# Patient Record
Sex: Female | Born: 1970
Health system: Southern US, Community
[De-identification: ages and names within clinical notes are randomized; demographics above are authoritative.]

## PROBLEM LIST (undated history)

## (undated) DIAGNOSIS — Z8489 Family history of other specified conditions: Secondary | ICD-10-CM

## (undated) DIAGNOSIS — K638219 Small intestinal bacterial overgrowth, unspecified: Secondary | ICD-10-CM

## (undated) DIAGNOSIS — Z5681 Sexual harassment on the job: Secondary | ICD-10-CM

## (undated) DIAGNOSIS — G43829 Menstrual migraine, not intractable, without status migrainosus: Secondary | ICD-10-CM

## (undated) DIAGNOSIS — N921 Excessive and frequent menstruation with irregular cycle: Secondary | ICD-10-CM

## (undated) DIAGNOSIS — D500 Iron deficiency anemia secondary to blood loss (chronic): Secondary | ICD-10-CM

## (undated) DIAGNOSIS — L719 Rosacea, unspecified: Secondary | ICD-10-CM

## (undated) DIAGNOSIS — I341 Nonrheumatic mitral (valve) prolapse: Secondary | ICD-10-CM

## (undated) DIAGNOSIS — F419 Anxiety disorder, unspecified: Secondary | ICD-10-CM

## (undated) DIAGNOSIS — J310 Chronic rhinitis: Secondary | ICD-10-CM

## (undated) DIAGNOSIS — K529 Noninfective gastroenteritis and colitis, unspecified: Secondary | ICD-10-CM

## (undated) DIAGNOSIS — E119 Type 2 diabetes mellitus without complications: Secondary | ICD-10-CM

## (undated) DIAGNOSIS — K909 Intestinal malabsorption, unspecified: Secondary | ICD-10-CM

## (undated) DIAGNOSIS — E039 Hypothyroidism, unspecified: Secondary | ICD-10-CM

## (undated) DIAGNOSIS — F411 Generalized anxiety disorder: Secondary | ICD-10-CM

## (undated) DIAGNOSIS — E785 Hyperlipidemia, unspecified: Secondary | ICD-10-CM

## (undated) DIAGNOSIS — K6389 Other specified diseases of intestine: Secondary | ICD-10-CM

## (undated) DIAGNOSIS — D649 Anemia, unspecified: Secondary | ICD-10-CM

## (undated) DIAGNOSIS — F341 Dysthymic disorder: Secondary | ICD-10-CM

## (undated) HISTORY — DX: Dysthymic disorder: F34.1

## (undated) HISTORY — DX: Small intestinal bacterial overgrowth, unspecified: K63.8219

## (undated) HISTORY — DX: Excessive and frequent menstruation with irregular cycle: N92.1

## (undated) HISTORY — DX: Hyperlipidemia, unspecified: E78.5

## (undated) HISTORY — DX: Intestinal malabsorption, unspecified: K90.9

## (undated) HISTORY — DX: Type 2 diabetes mellitus without complications: E11.9

## (undated) HISTORY — DX: Menstrual migraine, not intractable, without status migrainosus: G43.829

## (undated) HISTORY — DX: Rosacea, unspecified: L71.9

## (undated) HISTORY — DX: Sexual harassment on the job: Z56.81

## (undated) HISTORY — PX: COSMETIC SURGERY: SHX468

## (undated) HISTORY — DX: Generalized anxiety disorder: F41.1

## (undated) HISTORY — DX: Noninfective gastroenteritis and colitis, unspecified: K52.9

## (undated) HISTORY — DX: Iron deficiency anemia secondary to blood loss (chronic): D50.0

## (undated) HISTORY — DX: Chronic rhinitis: J31.0

## (undated) HISTORY — DX: Other specified diseases of intestine: K63.89

---

## 1993-08-11 DIAGNOSIS — F329 Major depressive disorder, single episode, unspecified: Secondary | ICD-10-CM | POA: Insufficient documentation

## 1998-10-28 DIAGNOSIS — E119 Type 2 diabetes mellitus without complications: Secondary | ICD-10-CM | POA: Insufficient documentation

## 1998-10-28 HISTORY — DX: Type 2 diabetes mellitus without complications: E11.9

## 2003-08-09 ENCOUNTER — Other Ambulatory Visit: Admission: RE | Admit: 2003-08-09 | Discharge: 2003-08-09 | Payer: Self-pay | Admitting: Obstetrics and Gynecology

## 2004-06-26 DIAGNOSIS — E785 Hyperlipidemia, unspecified: Secondary | ICD-10-CM

## 2004-06-26 HISTORY — DX: Hyperlipidemia, unspecified: E78.5

## 2004-07-12 ENCOUNTER — Other Ambulatory Visit: Admission: RE | Admit: 2004-07-12 | Discharge: 2004-07-12 | Payer: Self-pay | Admitting: Family Medicine

## 2005-07-19 ENCOUNTER — Other Ambulatory Visit: Admission: RE | Admit: 2005-07-19 | Discharge: 2005-07-19 | Payer: Self-pay | Admitting: Family Medicine

## 2005-11-09 ENCOUNTER — Ambulatory Visit: Payer: Self-pay | Admitting: Family Medicine

## 2005-11-21 ENCOUNTER — Ambulatory Visit: Payer: Self-pay | Admitting: Family Medicine

## 2005-12-13 ENCOUNTER — Ambulatory Visit: Payer: Self-pay | Admitting: Family Medicine

## 2006-02-08 ENCOUNTER — Ambulatory Visit: Payer: Self-pay | Admitting: Family Medicine

## 2006-03-13 ENCOUNTER — Ambulatory Visit: Payer: Self-pay | Admitting: Family Medicine

## 2006-05-29 ENCOUNTER — Ambulatory Visit: Payer: Self-pay | Admitting: Family Medicine

## 2006-07-29 ENCOUNTER — Ambulatory Visit: Payer: Self-pay | Admitting: Family Medicine

## 2006-07-29 ENCOUNTER — Other Ambulatory Visit: Admission: RE | Admit: 2006-07-29 | Discharge: 2006-07-29 | Payer: Self-pay | Admitting: Family Medicine

## 2006-10-03 ENCOUNTER — Ambulatory Visit: Payer: Self-pay | Admitting: Family Medicine

## 2006-12-25 ENCOUNTER — Ambulatory Visit: Payer: Self-pay | Admitting: Family Medicine

## 2007-01-15 ENCOUNTER — Ambulatory Visit: Payer: Self-pay | Admitting: Family Medicine

## 2007-02-27 DIAGNOSIS — Z5681 Sexual harassment on the job: Secondary | ICD-10-CM

## 2007-02-27 HISTORY — DX: Sexual harassment on the job: Z56.81

## 2007-06-02 ENCOUNTER — Ambulatory Visit: Payer: Self-pay | Admitting: Family Medicine

## 2007-08-18 ENCOUNTER — Ambulatory Visit: Payer: Self-pay | Admitting: Family Medicine

## 2007-08-18 ENCOUNTER — Other Ambulatory Visit: Admission: RE | Admit: 2007-08-18 | Discharge: 2007-08-18 | Payer: Self-pay | Admitting: Family Medicine

## 2007-09-17 ENCOUNTER — Ambulatory Visit: Payer: Self-pay | Admitting: Family Medicine

## 2007-11-06 ENCOUNTER — Ambulatory Visit: Payer: Self-pay | Admitting: Family Medicine

## 2007-12-01 ENCOUNTER — Ambulatory Visit: Payer: Self-pay | Admitting: Family Medicine

## 2008-02-05 ENCOUNTER — Ambulatory Visit: Payer: Self-pay | Admitting: Family Medicine

## 2008-02-17 ENCOUNTER — Ambulatory Visit: Payer: Self-pay | Admitting: Family Medicine

## 2008-04-27 ENCOUNTER — Ambulatory Visit: Payer: Self-pay | Admitting: Family Medicine

## 2008-05-13 ENCOUNTER — Ambulatory Visit: Payer: Self-pay | Admitting: Family Medicine

## 2008-05-14 ENCOUNTER — Encounter: Admission: RE | Admit: 2008-05-14 | Discharge: 2008-05-14 | Payer: Self-pay | Admitting: Family Medicine

## 2008-08-18 ENCOUNTER — Other Ambulatory Visit: Admission: RE | Admit: 2008-08-18 | Discharge: 2008-08-18 | Payer: Self-pay | Admitting: Family Medicine

## 2008-08-18 ENCOUNTER — Ambulatory Visit: Payer: Self-pay | Admitting: Family Medicine

## 2008-08-23 LAB — HM PAP SMEAR: HM Pap smear: NEGATIVE

## 2008-11-18 ENCOUNTER — Ambulatory Visit: Payer: Self-pay | Admitting: Family Medicine

## 2008-12-03 ENCOUNTER — Ambulatory Visit: Payer: Self-pay | Admitting: Family Medicine

## 2009-02-23 ENCOUNTER — Ambulatory Visit: Payer: Self-pay | Admitting: Family Medicine

## 2009-05-26 ENCOUNTER — Ambulatory Visit: Payer: Self-pay | Admitting: Physician Assistant

## 2009-06-28 ENCOUNTER — Ambulatory Visit: Payer: Self-pay | Admitting: Family Medicine

## 2009-07-12 ENCOUNTER — Ambulatory Visit: Payer: Self-pay | Admitting: Physician Assistant

## 2009-08-23 ENCOUNTER — Ambulatory Visit: Payer: Self-pay | Admitting: Physician Assistant

## 2009-11-23 ENCOUNTER — Ambulatory Visit: Payer: Self-pay | Admitting: Family Medicine

## 2009-12-21 ENCOUNTER — Encounter: Admission: RE | Admit: 2009-12-21 | Discharge: 2009-12-21 | Payer: Self-pay | Admitting: Family Medicine

## 2009-12-21 ENCOUNTER — Ambulatory Visit: Payer: Self-pay | Admitting: Family Medicine

## 2010-02-28 ENCOUNTER — Ambulatory Visit: Admit: 2010-02-28 | Payer: Self-pay | Admitting: Family Medicine

## 2010-03-14 ENCOUNTER — Encounter
Admission: RE | Admit: 2010-03-14 | Discharge: 2010-03-14 | Payer: Self-pay | Source: Home / Self Care | Attending: Family Medicine | Admitting: Family Medicine

## 2010-03-22 ENCOUNTER — Ambulatory Visit: Admit: 2010-03-22 | Payer: Self-pay | Admitting: Family Medicine

## 2010-05-03 ENCOUNTER — Ambulatory Visit (INDEPENDENT_AMBULATORY_CARE_PROVIDER_SITE_OTHER): Payer: BC Managed Care – PPO | Admitting: Family Medicine

## 2010-05-03 DIAGNOSIS — Z79899 Other long term (current) drug therapy: Secondary | ICD-10-CM

## 2010-05-03 DIAGNOSIS — E119 Type 2 diabetes mellitus without complications: Secondary | ICD-10-CM

## 2010-05-03 DIAGNOSIS — E7800 Pure hypercholesterolemia, unspecified: Secondary | ICD-10-CM

## 2010-05-03 DIAGNOSIS — R5381 Other malaise: Secondary | ICD-10-CM

## 2010-07-21 ENCOUNTER — Encounter: Payer: Self-pay | Admitting: *Deleted

## 2010-07-21 ENCOUNTER — Other Ambulatory Visit: Payer: Self-pay | Admitting: *Deleted

## 2010-07-21 DIAGNOSIS — E119 Type 2 diabetes mellitus without complications: Secondary | ICD-10-CM

## 2010-07-21 DIAGNOSIS — E782 Mixed hyperlipidemia: Secondary | ICD-10-CM

## 2010-07-21 MED ORDER — SAXAGLIPTIN HCL 5 MG PO TABS: 5.0000 mg | ORAL_TABLET | Freq: Every day | ORAL | Status: DC

## 2010-07-21 MED ORDER — OMEGA-3-ACID ETHYL ESTERS 1 G PO CAPS: 2.0000 g | ORAL_CAPSULE | Freq: Two times a day (BID) | ORAL | Status: DC

## 2010-08-17 ENCOUNTER — Encounter: Payer: Self-pay | Admitting: Family Medicine

## 2010-08-17 ENCOUNTER — Telehealth: Payer: Self-pay | Admitting: Family Medicine

## 2010-08-17 DIAGNOSIS — E785 Hyperlipidemia, unspecified: Secondary | ICD-10-CM | POA: Insufficient documentation

## 2010-08-17 DIAGNOSIS — E119 Type 2 diabetes mellitus without complications: Secondary | ICD-10-CM | POA: Insufficient documentation

## 2010-08-17 DIAGNOSIS — IMO0001 Reserved for inherently not codable concepts without codable children: Secondary | ICD-10-CM

## 2010-08-17 DIAGNOSIS — Z309 Encounter for contraceptive management, unspecified: Secondary | ICD-10-CM

## 2010-08-17 DIAGNOSIS — E7800 Pure hypercholesterolemia, unspecified: Secondary | ICD-10-CM | POA: Insufficient documentation

## 2010-08-17 DIAGNOSIS — G43909 Migraine, unspecified, not intractable, without status migrainosus: Secondary | ICD-10-CM | POA: Insufficient documentation

## 2010-08-17 MED ORDER — NORGESTREL-ETHINYL ESTRADIOL 0.3-30 MG-MCG PO TABS: 1.0000 | ORAL_TABLET | Freq: Every day | ORAL | Status: DC

## 2010-08-17 NOTE — Telephone Encounter (Signed)
Called in [place] [facility] w/1 rf for pt as her CPE is scheduled in July. Pt notified.

## 2010-08-28 ENCOUNTER — Encounter: Payer: BC Managed Care – PPO | Admitting: Family Medicine

## 2010-09-05 ENCOUNTER — Other Ambulatory Visit: Payer: Self-pay | Admitting: Family Medicine

## 2010-09-05 DIAGNOSIS — Z1231 Encounter for screening mammogram for malignant neoplasm of breast: Secondary | ICD-10-CM

## 2010-09-12 ENCOUNTER — Encounter: Payer: Self-pay | Admitting: Family Medicine

## 2010-09-14 ENCOUNTER — Ambulatory Visit (INDEPENDENT_AMBULATORY_CARE_PROVIDER_SITE_OTHER): Payer: BC Managed Care – PPO | Admitting: Family Medicine

## 2010-09-14 ENCOUNTER — Encounter: Payer: Self-pay | Admitting: Family Medicine

## 2010-09-14 VITALS — BP 110/70 | HR 68 | Ht 63.0 in | Wt 139.0 lb

## 2010-09-14 DIAGNOSIS — Z309 Encounter for contraceptive management, unspecified: Secondary | ICD-10-CM

## 2010-09-14 DIAGNOSIS — E559 Vitamin D deficiency, unspecified: Secondary | ICD-10-CM

## 2010-09-14 DIAGNOSIS — R319 Hematuria, unspecified: Secondary | ICD-10-CM

## 2010-09-14 DIAGNOSIS — G43829 Menstrual migraine, not intractable, without status migrainosus: Secondary | ICD-10-CM

## 2010-09-14 DIAGNOSIS — Z0000 Encounter for general adult medical examination without abnormal findings: Secondary | ICD-10-CM

## 2010-09-14 LAB — POCT URINALYSIS DIPSTICK
Blood, UA: NEGATIVE
Leukocytes, UA: NEGATIVE
Nitrite, UA: NEGATIVE
Protein, UA: NEGATIVE
pH, UA: 5

## 2010-09-14 MED ORDER — NORGESTREL-ETHINYL ESTRADIOL 0.3-30 MG-MCG PO TABS: 1.0000 | ORAL_TABLET | Freq: Every day | ORAL | Status: DC

## 2010-09-14 NOTE — Patient Instructions (Addendum)
As discussed, recommend a trial of lactose free diet.  If not improved, consider trial of gluten-free diet (1-2 weeks).  Try a different type of probiotic (Align).  Continue high fiber diet; try and avoid gas producing foods.  Drink plenty of water.  Consider Beano before foods which produce gas; use Simethicone (Gas-X) as needed for gas pain/bloating.  Colonoscopy and GI evaluation is recommended--please remember to call when insurance is better for you to have this scheduled  Flu shots are recommended yearly.  Consider taking either a multivitamin or a Hair and Nail vitamin to help with the nail changes you have noticed
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
## 2010-09-18 ENCOUNTER — Other Ambulatory Visit: Payer: Self-pay

## 2010-09-18 ENCOUNTER — Telehealth: Payer: Self-pay

## 2010-09-18 DIAGNOSIS — E119 Type 2 diabetes mellitus without complications: Secondary | ICD-10-CM

## 2010-09-18 MED ORDER — SAXAGLIPTIN HCL 5 MG PO TABS: 5.0000 mg | ORAL_TABLET | Freq: Every day | ORAL | Status: DC

## 2010-09-18 NOTE — Telephone Encounter (Signed)
Talked with pt and had to call pharm. They still dont have med yet so called it in

## 2010-09-18 NOTE — Telephone Encounter (Signed)
Pt informed of lab

## 2010-09-18 NOTE — Telephone Encounter (Signed)
Left message for pt to call me back 

## 2010-09-19 ENCOUNTER — Ambulatory Visit
Admission: RE | Admit: 2010-09-19 | Discharge: 2010-09-19 | Disposition: A | Discharge status: Home / Self Care | Payer: BC Managed Care – PPO | Source: Ambulatory Visit | Attending: Family Medicine | Admitting: Family Medicine

## 2010-09-19 DIAGNOSIS — Z1231 Encounter for screening mammogram for malignant neoplasm of breast: Secondary | ICD-10-CM

## 2010-09-28 ENCOUNTER — Other Ambulatory Visit: Payer: BC Managed Care – PPO

## 2010-10-19 ENCOUNTER — Other Ambulatory Visit (INDEPENDENT_AMBULATORY_CARE_PROVIDER_SITE_OTHER): Payer: BC Managed Care – PPO

## 2010-10-19 DIAGNOSIS — N390 Urinary tract infection, site not specified: Secondary | ICD-10-CM

## 2010-10-19 LAB — POCT URINALYSIS DIPSTICK
Bilirubin, UA: NEGATIVE
Ketones, UA: NEGATIVE
Protein, UA: NEGATIVE
Spec Grav, UA: 1.015
pH, UA: 7

## 2010-10-19 NOTE — Progress Notes (Signed)
Pt informed of u/a results

## 2010-10-28 LAB — HM DIABETES EYE EXAM

## 2010-11-02 ENCOUNTER — Ambulatory Visit: Payer: BC Managed Care – PPO | Admitting: Family Medicine

## 2010-11-06 ENCOUNTER — Other Ambulatory Visit: Payer: Self-pay | Admitting: Family Medicine

## 2010-11-06 DIAGNOSIS — Z1231 Encounter for screening mammogram for malignant neoplasm of breast: Secondary | ICD-10-CM

## 2010-11-09 ENCOUNTER — Ambulatory Visit (INDEPENDENT_AMBULATORY_CARE_PROVIDER_SITE_OTHER): Payer: BC Managed Care – PPO | Admitting: Family Medicine

## 2010-11-09 ENCOUNTER — Encounter: Payer: Self-pay | Admitting: Family Medicine

## 2010-11-09 VITALS — BP 106/70 | HR 92 | Ht 63.0 in | Wt 139.0 lb

## 2010-11-09 DIAGNOSIS — E559 Vitamin D deficiency, unspecified: Secondary | ICD-10-CM

## 2010-11-09 DIAGNOSIS — E119 Type 2 diabetes mellitus without complications: Secondary | ICD-10-CM

## 2010-11-09 DIAGNOSIS — E7800 Pure hypercholesterolemia, unspecified: Secondary | ICD-10-CM

## 2010-11-09 DIAGNOSIS — Z23 Encounter for immunization: Secondary | ICD-10-CM

## 2010-11-09 LAB — COMPREHENSIVE METABOLIC PANEL
ALT: 15 U/L (ref 0–35)
AST: 16 U/L (ref 0–37)
Albumin: 4.2 g/dL (ref 3.5–5.2)
CO2: 25 mEq/L (ref 19–32)
Calcium: 9.6 mg/dL (ref 8.4–10.5)
Chloride: 100 mEq/L (ref 96–112)
Creat: 0.79 mg/dL (ref 0.50–1.10)
Potassium: 4.4 mEq/L (ref 3.5–5.3)

## 2010-11-09 LAB — LIPID PANEL
Cholesterol: 108 mg/dL (ref 0–200)
Total CHOL/HDL Ratio: 2.6 Ratio

## 2010-11-09 LAB — POCT GLYCOSYLATED HEMOGLOBIN (HGB A1C): Hemoglobin A1C: 5.8

## 2010-11-09 MED ORDER — GLIMEPIRIDE 1 MG PO TABS: 0.5000 mg | ORAL_TABLET | Freq: Two times a day (BID) | ORAL | Status: DC

## 2010-11-09 MED ORDER — METFORMIN HCL 1000 MG PO TABS: 1000.0000 mg | ORAL_TABLET | Freq: Two times a day (BID) | ORAL | Status: DC

## 2010-11-09 MED ORDER — LISINOPRIL 5 MG PO TABS: 5.0000 mg | ORAL_TABLET | Freq: Every day | ORAL | Status: DC

## 2010-11-09 MED ORDER — ATORVASTATIN CALCIUM 20 MG PO TABS: 20.0000 mg | ORAL_TABLET | Freq: Every day | ORAL | Status: DC

## 2010-11-09 NOTE — Patient Instructions (Signed)
Cut back glimepiride to 1/2 tablet just once daily; if ongoing hypoglycemia, then discontinue entirely.  Monitor sugars regularly, and restart if sugars increase
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
## 2010-11-10 ENCOUNTER — Encounter: Payer: Self-pay | Admitting: Family Medicine

## 2010-11-10 LAB — VITAMIN D 25 HYDROXY (VIT D DEFICIENCY, FRACTURES): Vit D, 25-Hydroxy: 41 ng/mL (ref 30–89)

## 2011-01-02 ENCOUNTER — Other Ambulatory Visit: Payer: Self-pay | Admitting: Family Medicine

## 2011-01-15 ENCOUNTER — Other Ambulatory Visit: Payer: Self-pay | Admitting: *Deleted

## 2011-01-15 DIAGNOSIS — E7800 Pure hypercholesterolemia, unspecified: Secondary | ICD-10-CM

## 2011-01-15 DIAGNOSIS — E119 Type 2 diabetes mellitus without complications: Secondary | ICD-10-CM

## 2011-01-15 MED ORDER — ATORVASTATIN CALCIUM 20 MG PO TABS: 20.0000 mg | ORAL_TABLET | Freq: Every day | ORAL | Status: DC

## 2011-01-15 MED ORDER — GLIMEPIRIDE 1 MG PO TABS: 0.5000 mg | ORAL_TABLET | Freq: Two times a day (BID) | ORAL | Status: DC

## 2011-01-15 MED ORDER — LISINOPRIL 5 MG PO TABS: 5.0000 mg | ORAL_TABLET | Freq: Every day | ORAL | Status: DC

## 2011-01-15 MED ORDER — METFORMIN HCL 1000 MG PO TABS: 1000.0000 mg | ORAL_TABLET | Freq: Two times a day (BID) | ORAL | Status: DC

## 2011-01-15 MED ORDER — SAXAGLIPTIN HCL 5 MG PO TABS: 5.0000 mg | ORAL_TABLET | Freq: Every day | ORAL | Status: DC

## 2011-01-15 NOTE — Telephone Encounter (Signed)
Patient needed meds called into [facility](new pharmacy with her new ins plan).

## 2011-01-22 ENCOUNTER — Telehealth: Payer: Self-pay | Admitting: Family Medicine

## 2011-01-22 DIAGNOSIS — IMO0001 Reserved for inherently not codable concepts without codable children: Secondary | ICD-10-CM

## 2011-01-22 MED ORDER — NORGESTREL-ETHINYL ESTRADIOL 0.3-30 MG-MCG PO TABS: 1.0000 | ORAL_TABLET | Freq: Every day | ORAL | Status: DC

## 2011-01-22 NOTE — Telephone Encounter (Signed)
escribed to [facility].

## 2011-01-22 NOTE — Telephone Encounter (Signed)
Pt called and wants birth control Low Ogestral sent to [facility] mail order

## 2011-03-21 ENCOUNTER — Encounter: Payer: Self-pay | Admitting: Family Medicine

## 2011-03-21 ENCOUNTER — Ambulatory Visit (INDEPENDENT_AMBULATORY_CARE_PROVIDER_SITE_OTHER): Payer: BC Managed Care – PPO | Admitting: Family Medicine

## 2011-03-21 DIAGNOSIS — E119 Type 2 diabetes mellitus without complications: Secondary | ICD-10-CM

## 2011-03-21 DIAGNOSIS — IMO0001 Reserved for inherently not codable concepts without codable children: Secondary | ICD-10-CM

## 2011-03-21 DIAGNOSIS — G43829 Menstrual migraine, not intractable, without status migrainosus: Secondary | ICD-10-CM

## 2011-03-21 DIAGNOSIS — L659 Nonscarring hair loss, unspecified: Secondary | ICD-10-CM

## 2011-03-21 DIAGNOSIS — E7800 Pure hypercholesterolemia, unspecified: Secondary | ICD-10-CM

## 2011-03-21 DIAGNOSIS — Z23 Encounter for immunization: Secondary | ICD-10-CM

## 2011-03-21 NOTE — Patient Instructions (Signed)
Continue all of your current medications. Keep headache journal. Return sooner than 6 month followup if having ongoing headaches, to discuss possible preventative medications
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
## 2011-03-22 ENCOUNTER — Encounter: Payer: Self-pay | Admitting: Family Medicine

## 2011-03-22 LAB — LIPID PANEL: Cholesterol: 111 mg/dL (ref 0–200)

## 2011-03-22 LAB — TSH: TSH: 2.734 u[IU]/mL (ref 0.350–4.500)

## 2011-04-11 ENCOUNTER — Telehealth: Payer: Self-pay | Admitting: Family Medicine

## 2011-04-11 NOTE — Telephone Encounter (Signed)
This shouldn't be related to the vaccine.  If symptoms are ongoing or worsening, can schedule appt, but if only mild, just keep an eye on things.  Can use meclizine if needed for worsening dizziness, but make appt if persists

## 2011-04-11 NOTE — Telephone Encounter (Signed)
This was sent to me. PT STATES SINCE PNEUMONIA VACCINE, SHE NOTICED THAT SHE HAS STARTING SPOTTING A LITTLE AND FEELS LIKE FLUID IN EARS WHICH THROWS OFF HER BALANCE. WANTS TO KNOW IF THIS IS NORMAL REACTION TO VACCINE OR IF SHE SHLD BE CONCERNED.

## 2011-04-11 NOTE — Telephone Encounter (Signed)
Spoke with patient and let her know that these symptoms are not related to the vaccine. If symptoms persist or worsen she will need OV. Also rec she try meclizine if needed for worsening dizziness.

## 2011-04-24 ENCOUNTER — Telehealth: Payer: Self-pay | Admitting: Family Medicine

## 2011-04-24 DIAGNOSIS — E119 Type 2 diabetes mellitus without complications: Secondary | ICD-10-CM

## 2011-04-24 MED ORDER — SAXAGLIPTIN HCL 5 MG PO TABS: 5.0000 mg | ORAL_TABLET | Freq: Every day | ORAL | Status: DC

## 2011-04-24 NOTE — Telephone Encounter (Signed)
Pt needs refill for ongliza 5 mg 1 qd  At [facility]   #30 .  She states pharm always gets this wrong so she req we order this.

## 2011-04-24 NOTE — Telephone Encounter (Signed)
Done (per computer, it looks like it may have been sent to [facility] for only #30 in November, with refills, may have been our error, not pharm)

## 2011-08-13 ENCOUNTER — Other Ambulatory Visit: Payer: Self-pay | Admitting: *Deleted

## 2011-08-13 DIAGNOSIS — IMO0001 Reserved for inherently not codable concepts without codable children: Secondary | ICD-10-CM

## 2011-08-13 MED ORDER — NORGESTREL-ETHINYL ESTRADIOL 0.3-30 MG-MCG PO TABS: 1.0000 | ORAL_TABLET | Freq: Every day | ORAL | Status: DC

## 2011-09-11 ENCOUNTER — Encounter: Payer: Self-pay | Admitting: Internal Medicine

## 2011-09-20 ENCOUNTER — Encounter: Payer: BC Managed Care – PPO | Admitting: Family Medicine

## 2011-09-20 ENCOUNTER — Ambulatory Visit
Admission: RE | Admit: 2011-09-20 | Discharge: 2011-09-20 | Disposition: A | Discharge status: Home / Self Care | Payer: BC Managed Care – PPO | Source: Ambulatory Visit | Attending: Family Medicine | Admitting: Family Medicine

## 2011-09-20 DIAGNOSIS — Z1231 Encounter for screening mammogram for malignant neoplasm of breast: Secondary | ICD-10-CM

## 2011-09-24 ENCOUNTER — Ambulatory Visit (INDEPENDENT_AMBULATORY_CARE_PROVIDER_SITE_OTHER): Payer: BC Managed Care – PPO | Admitting: Family Medicine

## 2011-09-24 ENCOUNTER — Encounter: Payer: Self-pay | Admitting: Family Medicine

## 2011-09-24 VITALS — BP 112/70 | HR 72 | Ht 63.0 in | Wt 139.0 lb

## 2011-09-24 DIAGNOSIS — G4700 Insomnia, unspecified: Secondary | ICD-10-CM

## 2011-09-24 DIAGNOSIS — E7800 Pure hypercholesterolemia, unspecified: Secondary | ICD-10-CM

## 2011-09-24 DIAGNOSIS — G43909 Migraine, unspecified, not intractable, without status migrainosus: Secondary | ICD-10-CM

## 2011-09-24 DIAGNOSIS — F411 Generalized anxiety disorder: Secondary | ICD-10-CM

## 2011-09-24 DIAGNOSIS — R5383 Other fatigue: Secondary | ICD-10-CM

## 2011-09-24 DIAGNOSIS — IMO0001 Reserved for inherently not codable concepts without codable children: Secondary | ICD-10-CM

## 2011-09-24 DIAGNOSIS — Z309 Encounter for contraceptive management, unspecified: Secondary | ICD-10-CM

## 2011-09-24 DIAGNOSIS — E119 Type 2 diabetes mellitus without complications: Secondary | ICD-10-CM

## 2011-09-24 DIAGNOSIS — R5381 Other malaise: Secondary | ICD-10-CM

## 2011-09-24 DIAGNOSIS — G43829 Menstrual migraine, not intractable, without status migrainosus: Secondary | ICD-10-CM

## 2011-09-24 LAB — COMPREHENSIVE METABOLIC PANEL
ALT: 14 U/L (ref 0–35)
CO2: 28 mEq/L (ref 19–32)
Calcium: 9.4 mg/dL (ref 8.4–10.5)
Chloride: 101 mEq/L (ref 96–112)
Creat: 0.74 mg/dL (ref 0.50–1.10)
Glucose, Bld: 89 mg/dL (ref 70–99)
Sodium: 135 mEq/L (ref 135–145)
Total Protein: 6.7 g/dL (ref 6.0–8.3)

## 2011-09-24 LAB — CBC WITH DIFFERENTIAL/PLATELET
Basophils Absolute: 0 K/uL (ref 0.0–0.1)
Basophils Relative: 0 % (ref 0–1)
Eosinophils Absolute: 0.1 K/uL (ref 0.0–0.7)
Eosinophils Relative: 1 % (ref 0–5)
HCT: 40.2 % (ref 36.0–46.0)
Hemoglobin: 13.2 g/dL (ref 12.0–15.0)
Lymphocytes Relative: 30 % (ref 12–46)
Lymphs Abs: 2.9 K/uL (ref 0.7–4.0)
MCH: 29.4 pg (ref 26.0–34.0)
MCHC: 32.8 g/dL (ref 30.0–36.0)
MCV: 89.5 fL (ref 78.0–100.0)
Monocytes Absolute: 0.5 K/uL (ref 0.1–1.0)
Monocytes Relative: 6 % (ref 3–12)
Neutro Abs: 5.9 K/uL (ref 1.7–7.7)
Neutrophils Relative %: 63 % (ref 43–77)
Platelets: 308 K/uL (ref 150–400)
RBC: 4.49 MIL/uL (ref 3.87–5.11)
RDW: 13.5 % (ref 11.5–15.5)
WBC: 9.4 K/uL (ref 4.0–10.5)

## 2011-09-24 LAB — LIPID PANEL
Cholesterol: 121 mg/dL (ref 0–200)
Triglycerides: 73 mg/dL (ref <150)

## 2011-09-24 LAB — POCT GLYCOSYLATED HEMOGLOBIN (HGB A1C): Hemoglobin A1C: 6.5

## 2011-09-24 MED ORDER — METFORMIN HCL 1000 MG PO TABS: 1000.0000 mg | ORAL_TABLET | Freq: Two times a day (BID) | ORAL | Status: DC

## 2011-09-24 MED ORDER — GLIMEPIRIDE 1 MG PO TABS: 0.5000 mg | ORAL_TABLET | Freq: Two times a day (BID) | ORAL | Status: DC

## 2011-09-24 MED ORDER — LISINOPRIL 5 MG PO TABS: 5.0000 mg | ORAL_TABLET | Freq: Every day | ORAL | Status: DC

## 2011-09-24 MED ORDER — TRETINOIN 0.025 % EX GEL: 1.0000 [IU] | Freq: Every day | CUTANEOUS | Status: DC

## 2011-09-24 MED ORDER — ATORVASTATIN CALCIUM 20 MG PO TABS: 20.0000 mg | ORAL_TABLET | Freq: Every day | ORAL | Status: DC

## 2011-09-24 MED ORDER — SAXAGLIPTIN HCL 5 MG PO TABS: 5.0000 mg | ORAL_TABLET | Freq: Every day | ORAL | Status: DC

## 2011-09-24 MED ORDER — FROVATRIPTAN SUCCINATE 2.5 MG PO TABS: 2.5000 mg | ORAL_TABLET | ORAL | Status: DC | PRN
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
## 2011-09-24 NOTE — Patient Instructions (Addendum)
Continue all of your current medications. Your diabetes is well controlled.  Re-try the Frova to see if this medication is helpful in treating migraines (so that you don't have them lasting for 3-4 days).  If headaches continue to last for a while, and cause you to miss work on a regular basis, then I think a preventative medication is a good idea.  We discussed using nortriptylene as a preventative measure, versus referring you to the headache wellness center for treatment. I think the nortriptylene may help you sleep, which in and of itself might help your headaches. I recommend that you discuss your sleep issues with Dr. Evelene Croon, as anxiety seems to be a component of your trouble sleeping, and you haven't responded to the typical sleep meds.

## 2011-09-25 ENCOUNTER — Encounter: Payer: Self-pay | Admitting: Family Medicine

## 2011-09-25 DIAGNOSIS — G4700 Insomnia, unspecified: Secondary | ICD-10-CM | POA: Insufficient documentation

## 2011-09-25 DIAGNOSIS — F411 Generalized anxiety disorder: Secondary | ICD-10-CM | POA: Insufficient documentation

## 2011-09-25 LAB — MICROALBUMIN / CREATININE URINE RATIO: Microalb, Ur: 0.5 mg/dL (ref 0.00–1.89)

## 2011-09-27 ENCOUNTER — Telehealth: Payer: Self-pay | Admitting: Family Medicine

## 2011-09-27 NOTE — Telephone Encounter (Signed)
LM

## 2011-10-01 ENCOUNTER — Telehealth: Payer: Self-pay | Admitting: Internal Medicine

## 2011-10-01 MED ORDER — TRETINOIN 0.025 % EX CREA: 1.0000 [IU] | TOPICAL_CREAM | Freq: Every day | CUTANEOUS | Status: DC

## 2011-10-01 NOTE — Telephone Encounter (Signed)
Okay to change? 

## 2011-10-01 NOTE — Telephone Encounter (Signed)
Patient notified

## 2011-10-02 ENCOUNTER — Telehealth: Payer: Self-pay | Admitting: Family Medicine

## 2011-10-02 NOTE — Telephone Encounter (Signed)
LM

## 2011-11-22 ENCOUNTER — Other Ambulatory Visit: Payer: Self-pay | Admitting: *Deleted

## 2011-11-22 DIAGNOSIS — IMO0001 Reserved for inherently not codable concepts without codable children: Secondary | ICD-10-CM

## 2011-11-22 MED ORDER — NORGESTREL-ETHINYL ESTRADIOL 0.3-30 MG-MCG PO TABS: 1.0000 | ORAL_TABLET | Freq: Every day | ORAL | Status: DC

## 2011-12-05 ENCOUNTER — Ambulatory Visit (INDEPENDENT_AMBULATORY_CARE_PROVIDER_SITE_OTHER): Payer: BC Managed Care – PPO | Admitting: Family Medicine

## 2011-12-05 ENCOUNTER — Encounter: Payer: Self-pay | Admitting: Family Medicine

## 2011-12-05 VITALS — BP 128/74 | HR 84 | Ht 63.25 in | Wt 142.0 lb

## 2011-12-05 DIAGNOSIS — Z23 Encounter for immunization: Secondary | ICD-10-CM

## 2011-12-05 DIAGNOSIS — Z309 Encounter for contraceptive management, unspecified: Secondary | ICD-10-CM

## 2011-12-05 DIAGNOSIS — Z0000 Encounter for general adult medical examination without abnormal findings: Secondary | ICD-10-CM

## 2011-12-05 DIAGNOSIS — IMO0001 Reserved for inherently not codable concepts without codable children: Secondary | ICD-10-CM

## 2011-12-05 DIAGNOSIS — Z01419 Encounter for gynecological examination (general) (routine) without abnormal findings: Secondary | ICD-10-CM | POA: Insufficient documentation

## 2011-12-05 DIAGNOSIS — R319 Hematuria, unspecified: Secondary | ICD-10-CM

## 2011-12-05 DIAGNOSIS — G43829 Menstrual migraine, not intractable, without status migrainosus: Secondary | ICD-10-CM

## 2011-12-05 LAB — POCT URINALYSIS DIPSTICK
Bilirubin, UA: NEGATIVE
Glucose, UA: NEGATIVE
Ketones, UA: NEGATIVE
Protein, UA: NEGATIVE

## 2011-12-05 MED ORDER — NORGESTREL-ETHINYL ESTRADIOL 0.3-30 MG-MCG PO TABS: 1.0000 | ORAL_TABLET | Freq: Every day | ORAL | Status: DC

## 2011-12-05 MED ORDER — HYDROCODONE-ACETAMINOPHEN 5-500 MG PO TABS: 1.0000 | ORAL_TABLET | Freq: Four times a day (QID) | ORAL | Status: DC | PRN

## 2011-12-05 NOTE — Patient Instructions (Addendum)
HEALTH MAINTENANCE RECOMMENDATIONS:  It is recommended that you get at least 30 minutes of aerobic exercise at least 5 days/week (for weight loss, you may need as much as 60-90 minutes). This can be any activity that gets your heart rate up. This can be divided in 10-15 minute intervals if needed, but try and build up your endurance at least once a week.  Weight bearing exercise is also recommended twice weekly.  Eat a healthy diet with lots of vegetables, fruits and fiber.  "Colorful" foods have a lot of vitamins (ie green vegetables, tomatoes, red peppers, etc).  Limit sweet tea, regular sodas and alcoholic beverages, all of which has a lot of calories and sugar.  Up to 1 alcoholic drink daily may be beneficial for women (unless trying to lose weight, watch sugars).  Drink a lot of water.  Calcium recommendations are 1200-1500 mg daily (1500 mg for postmenopausal women or women without ovaries), and vitamin D 1000 IU daily.  This should be obtained from diet and/or supplements (vitamins), and calcium should not be taken all at once, but in divided doses.  Monthly self breast exams and yearly mammograms for women over the age of 31 is recommended.  Sunscreen of at least SPF 30 should be used on all sun-exposed parts of the skin when outside between the hours of 10 am and 4 pm (not just when at beach or pool, but even with exercise, golf, tennis, and yard work!)  Use a sunscreen that says "broad spectrum" so it covers both UVA and UVB rays, and make sure to reapply every 1-2 hours.  Remember to change the batteries in your smoke detectors when changing your clock times in the spring and fall.  Use your seat belt every time you are in a car, and please drive safely and not be distracted with cell phones and texting while driving.  You can use ibuprofen 600 mg every 6 hours (4x/day) or 800 mg every 8 hrs (3x/day)--you must take with food, and back down on the dose if it bothers your stomach (ie 3-4  ibuprofen/advil/motrin OTC tablets, 200mg  each). You can use this along with Frova if needed, and okay to use the pain med along with this, if needed.  The hydrocodone can make you sleepy, so be careful if take it and need to drive.
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
## 2011-12-06 ENCOUNTER — Other Ambulatory Visit (HOSPITAL_COMMUNITY)
Admission: RE | Admit: 2011-12-06 | Discharge: 2011-12-06 | Disposition: A | Discharge status: Home / Self Care | Payer: BC Managed Care – PPO | Source: Ambulatory Visit | Attending: Family Medicine | Admitting: Family Medicine

## 2011-12-07 LAB — URINE CULTURE
Colony Count: NO GROWTH
Organism ID, Bacteria: NO GROWTH

## 2011-12-09 ENCOUNTER — Encounter: Payer: Self-pay | Admitting: Family Medicine

## 2012-02-01 ENCOUNTER — Ambulatory Visit (INDEPENDENT_AMBULATORY_CARE_PROVIDER_SITE_OTHER): Payer: BC Managed Care – PPO | Admitting: Medical

## 2012-02-01 ENCOUNTER — Encounter: Payer: Self-pay | Admitting: Medical

## 2012-02-01 VITALS — BP 100/70 | HR 80 | Temp 98.5°F | Resp 16 | Wt 140.0 lb

## 2012-02-01 DIAGNOSIS — B349 Viral infection, unspecified: Secondary | ICD-10-CM

## 2012-02-01 DIAGNOSIS — B9789 Other viral agents as the cause of diseases classified elsewhere: Secondary | ICD-10-CM
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
## 2012-02-02 ENCOUNTER — Encounter: Payer: Self-pay | Admitting: Medical

## 2012-02-04 LAB — POCT URINALYSIS DIPSTICK
Bilirubin, UA: NEGATIVE
Urobilinogen, UA: NEGATIVE

## 2012-02-04 LAB — POCT INFLUENZA A/B: Influenza B, POC: NEGATIVE

## 2012-03-13 ENCOUNTER — Other Ambulatory Visit: Payer: Self-pay | Admitting: *Deleted

## 2012-04-07 ENCOUNTER — Other Ambulatory Visit: Payer: Self-pay | Admitting: Family Medicine

## 2012-04-07 ENCOUNTER — Telehealth: Payer: Self-pay | Admitting: Internal Medicine

## 2012-04-07 DIAGNOSIS — E119 Type 2 diabetes mellitus without complications: Secondary | ICD-10-CM

## 2012-04-07 MED ORDER — SAXAGLIPTIN HCL 5 MG PO TABS: 5.0000 mg | ORAL_TABLET | Freq: Every day | ORAL | Status: DC

## 2012-04-08 NOTE — Telephone Encounter (Signed)
done

## 2012-04-09 ENCOUNTER — Ambulatory Visit (INDEPENDENT_AMBULATORY_CARE_PROVIDER_SITE_OTHER): Payer: BC Managed Care – PPO | Admitting: Family Medicine

## 2012-04-09 ENCOUNTER — Encounter: Payer: Self-pay | Admitting: Family Medicine

## 2012-04-09 VITALS — BP 110/70 | HR 68 | Ht 63.25 in | Wt 142.0 lb

## 2012-04-09 DIAGNOSIS — E119 Type 2 diabetes mellitus without complications: Secondary | ICD-10-CM

## 2012-04-09 DIAGNOSIS — E785 Hyperlipidemia, unspecified: Secondary | ICD-10-CM

## 2012-04-09 DIAGNOSIS — G43829 Menstrual migraine, not intractable, without status migrainosus: Secondary | ICD-10-CM

## 2012-04-09 DIAGNOSIS — IMO0001 Reserved for inherently not codable concepts without codable children: Secondary | ICD-10-CM

## 2012-04-09 DIAGNOSIS — Z309 Encounter for contraceptive management, unspecified: Secondary | ICD-10-CM

## 2012-04-09 DIAGNOSIS — E7800 Pure hypercholesterolemia, unspecified: Secondary | ICD-10-CM

## 2012-04-09 DIAGNOSIS — G43909 Migraine, unspecified, not intractable, without status migrainosus: Secondary | ICD-10-CM

## 2012-04-09 LAB — POCT GLYCOSYLATED HEMOGLOBIN (HGB A1C): Hemoglobin A1C: 6.4

## 2012-04-09 LAB — LIPID PANEL
HDL: 43 mg/dL (ref >39)
LDL Cholesterol: 59 mg/dL (ref 0–99)

## 2012-04-09 LAB — TSH: TSH: 2.348 u[IU]/mL (ref 0.350–4.500)

## 2012-04-09 MED ORDER — HYDROCODONE-ACETAMINOPHEN 5-500 MG PO TABS: 1.0000 | ORAL_TABLET | Freq: Four times a day (QID) | ORAL | Status: DC | PRN

## 2012-04-09 MED ORDER — FROVATRIPTAN SUCCINATE 2.5 MG PO TABS: 2.5000 mg | ORAL_TABLET | ORAL | Status: DC | PRN

## 2012-04-09 MED ORDER — ATORVASTATIN CALCIUM 20 MG PO TABS: 20.0000 mg | ORAL_TABLET | Freq: Every day | ORAL | Status: DC

## 2012-04-09 MED ORDER — SAXAGLIPTIN HCL 5 MG PO TABS: 5.0000 mg | ORAL_TABLET | Freq: Every day | ORAL | Status: DC

## 2012-04-09 MED ORDER — METFORMIN HCL 1000 MG PO TABS: 1000.0000 mg | ORAL_TABLET | Freq: Two times a day (BID) | ORAL | Status: DC

## 2012-04-09 MED ORDER — LISINOPRIL 5 MG PO TABS: 5.0000 mg | ORAL_TABLET | Freq: Every day | ORAL | Status: DC

## 2012-04-09 MED ORDER — GLIMEPIRIDE 1 MG PO TABS: 0.5000 mg | ORAL_TABLET | Freq: Two times a day (BID) | ORAL | Status: DC

## 2012-04-09 MED ORDER — NORGESTREL-ETHINYL ESTRADIOL 0.3-30 MG-MCG PO TABS: 1.0000 | ORAL_TABLET | Freq: Every day | ORAL | Status: DC
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
## 2012-04-09 NOTE — Patient Instructions (Addendum)
Continue your current medications.  We will contact you with your results soon. Your diabetes is well controlled.

## 2012-04-10 ENCOUNTER — Encounter: Payer: Self-pay | Admitting: Family Medicine

## 2012-04-10 LAB — HEPATIC FUNCTION PANEL
AST: 20 U/L (ref 0–37)
Albumin: 4 g/dL (ref 3.5–5.2)
Alkaline Phosphatase: 63 U/L (ref 39–117)
Indirect Bilirubin: 0.2 mg/dL (ref 0.0–0.9)
Total Bilirubin: 0.3 mg/dL (ref 0.3–1.2)

## 2012-05-20 ENCOUNTER — Ambulatory Visit (INDEPENDENT_AMBULATORY_CARE_PROVIDER_SITE_OTHER): Payer: BC Managed Care – PPO | Admitting: Family Medicine

## 2012-05-20 ENCOUNTER — Encounter: Payer: Self-pay | Admitting: Family Medicine

## 2012-05-20 VITALS — BP 110/80 | HR 95 | Wt 144.0 lb

## 2012-05-20 DIAGNOSIS — R5381 Other malaise: Secondary | ICD-10-CM

## 2012-05-20 DIAGNOSIS — R5383 Other fatigue: Secondary | ICD-10-CM

## 2012-05-20 LAB — POCT URINALYSIS DIPSTICK
Bilirubin, UA: NEGATIVE
Blood, UA: NEGATIVE
Glucose, UA: NEGATIVE
Ketones, UA: NEGATIVE
Spec Grav, UA: 1.015

## 2012-05-20 LAB — CBC WITH DIFFERENTIAL/PLATELET
Eosinophils Absolute: 0.1 10*3/uL (ref 0.0–0.7)
Eosinophils Relative: 1 % (ref 0–5)
HCT: 40.5 % (ref 36.0–46.0)
Hemoglobin: 13.7 g/dL (ref 12.0–15.0)
Lymphocytes Relative: 20 % (ref 12–46)
Lymphs Abs: 1.8 10*3/uL (ref 0.7–4.0)
MCH: 29.7 pg (ref 26.0–34.0)
MCV: 87.7 fL (ref 78.0–100.0)
Monocytes Relative: 5 % (ref 3–12)
Platelets: 282 10*3/uL (ref 150–400)
RBC: 4.62 MIL/uL (ref 3.87–5.11)
WBC: 9 10*3/uL (ref 4.0–10.5)

## 2012-05-20 LAB — COMPREHENSIVE METABOLIC PANEL
ALT: 10 U/L (ref 0–35)
CO2: 25 mEq/L (ref 19–32)
Calcium: 9.1 mg/dL (ref 8.4–10.5)
Chloride: 102 mEq/L (ref 96–112)
Creat: 0.71 mg/dL (ref 0.50–1.10)
Glucose, Bld: 137 mg/dL — ABNORMAL HIGH (ref 70–99)
Total Bilirubin: 0.3 mg/dL (ref 0.3–1.2)
Total Protein: 6.7 g/dL (ref 6.0–8.3)

## 2012-05-20 NOTE — Progress Notes (Signed)
  Subjective:    Patient ID: Denise Kerr, female    DOB: 1970/07/13, 42 y.o.   MRN: [id number]  HPI She woke up 6 days ago with fatigue and decreased appetite. No fever, chills, sore throat, nausea, vomiting or diarrhea, urinary symptoms.Her menstrual cycles have been unchanged.no new stresses and in fact she just finished a big work project. No skin or hair changes, hot or cold and hot   Review of Systems     Objective:   Physical Exam alert and in no distress. Sclera are normal.Tympanic membranes and canals are normal. Throat is clear. Tonsils are normal. Neck is supple without adenopathy or thyromegaly. Cardiac exam shows a regular sinus rhythm without murmurs or gallops. Lungs are clear to auscultation.Donald exam shows no hepatosplenomegaly. DTRs normal. EKG shows no acute changes. Urine dipstick was negative       Assessment & Plan:  Fatigue - Plan: POCT urinalysis dipstick, CBC with Differential, Comprehensive metabolic panel, EKG 12-Lead  followup pending results of blood work.

## 2012-05-22 ENCOUNTER — Telehealth: Payer: Self-pay | Admitting: Family Medicine

## 2012-05-22 NOTE — Telephone Encounter (Signed)
Called and left message on pt cell# that pt needs to try on through the weekend and if still having difficulty to have her to schedule with dr.knapp(My note actually said for her set up an appointment ,but have her see how she does through the weekend and if still having difficulty, however scheduled to see [name])

## 2012-05-22 NOTE — Telephone Encounter (Signed)
Please call still not feeling much better.  States you told her to call back today if no better.  Still fatigued

## 2012-05-22 NOTE — Telephone Encounter (Signed)
My note actually said for her set up an appointment ,but have her see how she does through the weekend and if still having difficulty, however scheduled to see [name]

## 2012-06-02 ENCOUNTER — Telehealth: Payer: Self-pay | Admitting: Family Medicine

## 2012-06-02 NOTE — Telephone Encounter (Signed)
Please call, still having fatigue, not any better. Also, still having the sinus drainage issues, meds don't seem to help just make her dehydrated  Offered appointment, she states she is not sure what else can be done, already had blood work done

## 2012-06-02 NOTE — Telephone Encounter (Signed)
I talked to Denise Kerr about this. Call her up and have her schedule appointment to followup with [name].

## 2012-06-02 NOTE — Telephone Encounter (Signed)
Looks like she last saw Dr. Susann Givens; not sure what f/u plan is

## 2012-06-02 NOTE — Telephone Encounter (Signed)
CALLED PT HOME # LEFT MESSAGE TO PLEASE MAKE APT WITH DR.KNAPP FOR FOLLOW UP

## 2012-06-03 ENCOUNTER — Ambulatory Visit (INDEPENDENT_AMBULATORY_CARE_PROVIDER_SITE_OTHER): Payer: BC Managed Care – PPO | Admitting: Family Medicine

## 2012-06-03 ENCOUNTER — Encounter: Payer: Self-pay | Admitting: Family Medicine

## 2012-06-03 VITALS — BP 110/70 | HR 90 | Wt 142.0 lb

## 2012-06-03 DIAGNOSIS — N390 Urinary tract infection, site not specified: Secondary | ICD-10-CM

## 2012-06-03 DIAGNOSIS — R5381 Other malaise: Secondary | ICD-10-CM

## 2012-06-03 DIAGNOSIS — R8271 Bacteriuria: Secondary | ICD-10-CM

## 2012-06-03 DIAGNOSIS — J302 Other seasonal allergic rhinitis: Secondary | ICD-10-CM | POA: Insufficient documentation

## 2012-06-03 DIAGNOSIS — J309 Allergic rhinitis, unspecified: Secondary | ICD-10-CM

## 2012-06-03 DIAGNOSIS — R5383 Other fatigue: Secondary | ICD-10-CM

## 2012-06-03 DIAGNOSIS — R82998 Other abnormal findings in urine: Secondary | ICD-10-CM

## 2012-06-03 LAB — POCT URINALYSIS DIPSTICK
Bilirubin, UA: NEGATIVE
pH, UA: 8

## 2012-06-03 MED ORDER — SULFAMETHOXAZOLE-TRIMETHOPRIM 800-160 MG PO TABS: 1.0000 | ORAL_TABLET | Freq: Two times a day (BID) | ORAL | Status: DC

## 2012-06-03 NOTE — Progress Notes (Signed)
  Subjective:    Patient ID: Denise Kerr, female    DOB: Aug 20, 1970, 42 y.o.   MRN: [id number]  HPI She is here for reevaluation of fatigue. This has continued and in the last 2 days she is also noted sore throat, sneezing, nasal congestion with rhinorrhea. She usually takes OTC meds for this. Yesterday she also noted urinary frequency but no urgency or dysuria. Her work has her quite stressed as she we'll probably lose a job when she finishes this project.   Review of Systems     Objective:   Physical Exam alert and in no distress. Tympanic membranes and canals are normal. Throat is clear. Tonsils are normal. Neck is supple without adenopathy or thyromegaly. Cardiac exam shows a regular sinus rhythm without murmurs or gallops. Lungs are clear to auscultation. Urine microscopic did show bacteria and scattered white cells.       Assessment & Plan:  Other malaise and fatigue - Plan: POCT urinalysis dipstick  Bacteria in urine - Plan: Urine culture  UTI (urinary tract infection) - Plan: sulfamethoxazole-trimethoprim (BACTRIM DS,SEPTRA DS) 800-160 MG per tablet, CANCELED: CULTURE, URINE COMPREHENSIVE  Allergic rhinitis, seasonal  I will get a culture on this. She is to let us know how she is doing. Recommend she continue to treat her seasonal allergies.

## 2012-06-05 LAB — URINE CULTURE: Colony Count: NO GROWTH

## 2012-08-11 ENCOUNTER — Other Ambulatory Visit: Payer: Self-pay | Admitting: Family Medicine

## 2012-08-11 DIAGNOSIS — G43909 Migraine, unspecified, not intractable, without status migrainosus: Secondary | ICD-10-CM

## 2012-08-11 DIAGNOSIS — G43829 Menstrual migraine, not intractable, without status migrainosus: Secondary | ICD-10-CM

## 2012-08-11 MED ORDER — HYDROCODONE-ACETAMINOPHEN 5-500 MG PO TABS: 1.0000 | ORAL_TABLET | Freq: Four times a day (QID) | ORAL | Status: DC | PRN

## 2012-08-11 MED ORDER — FROVATRIPTAN SUCCINATE 2.5 MG PO TABS: 2.5000 mg | ORAL_TABLET | ORAL | Status: DC | PRN

## 2012-08-11 NOTE — Telephone Encounter (Signed)
Please phone in the hydrocodone refill.  I already sent Frova

## 2012-09-15 ENCOUNTER — Other Ambulatory Visit: Payer: Self-pay | Admitting: Family Medicine

## 2012-10-03 ENCOUNTER — Other Ambulatory Visit: Payer: Self-pay | Admitting: Family Medicine

## 2012-10-03 ENCOUNTER — Other Ambulatory Visit: Payer: Self-pay

## 2012-10-03 DIAGNOSIS — Z1231 Encounter for screening mammogram for malignant neoplasm of breast: Secondary | ICD-10-CM

## 2012-10-17 ENCOUNTER — Ambulatory Visit
Admission: RE | Admit: 2012-10-17 | Discharge: 2012-10-17 | Disposition: A | Discharge status: Home / Self Care | Payer: BC Managed Care – PPO | Source: Ambulatory Visit

## 2012-10-17 DIAGNOSIS — Z1231 Encounter for screening mammogram for malignant neoplasm of breast: Secondary | ICD-10-CM

## 2012-10-17 LAB — HM DIABETES EYE EXAM: HM Diabetic Eye Exam: NORMAL

## 2012-10-20 ENCOUNTER — Encounter: Payer: BC Managed Care – PPO | Admitting: Family Medicine

## 2012-10-21 ENCOUNTER — Telehealth: Payer: Self-pay | Admitting: Family Medicine

## 2012-10-21 LAB — HM MAMMOGRAPHY: HM Mammogram: NORMAL

## 2012-10-21 NOTE — Telephone Encounter (Signed)
LM

## 2012-10-29 ENCOUNTER — Encounter: Payer: Self-pay | Admitting: Family Medicine

## 2012-10-29 ENCOUNTER — Ambulatory Visit (INDEPENDENT_AMBULATORY_CARE_PROVIDER_SITE_OTHER): Payer: BC Managed Care – PPO | Admitting: Family Medicine

## 2012-10-29 VITALS — BP 120/60 | HR 80 | Ht 63.25 in | Wt 142.0 lb

## 2012-10-29 DIAGNOSIS — E7800 Pure hypercholesterolemia, unspecified: Secondary | ICD-10-CM

## 2012-10-29 DIAGNOSIS — R638 Other symptoms and signs concerning food and fluid intake: Secondary | ICD-10-CM

## 2012-10-29 DIAGNOSIS — E119 Type 2 diabetes mellitus without complications: Secondary | ICD-10-CM

## 2012-10-29 DIAGNOSIS — Z23 Encounter for immunization: Secondary | ICD-10-CM

## 2012-10-29 MED ORDER — ATORVASTATIN CALCIUM 20 MG PO TABS: ORAL_TABLET | ORAL | Status: DC

## 2012-10-29 MED ORDER — SAXAGLIPTIN HCL 5 MG PO TABS: 5.0000 mg | ORAL_TABLET | Freq: Every day | ORAL | Status: DC

## 2012-10-29 MED ORDER — LISINOPRIL 5 MG PO TABS: ORAL_TABLET | ORAL | Status: DC

## 2012-10-29 MED ORDER — GLIMEPIRIDE 1 MG PO TABS: ORAL_TABLET | ORAL | Status: DC

## 2012-10-29 MED ORDER — METFORMIN HCL 1000 MG PO TABS: ORAL_TABLET | ORAL | Status: DC

## 2012-10-29 NOTE — Patient Instructions (Signed)
Continue your same medications.  Try and increase your exercise to 3 hours/week.  Let me know if you would like to see a nutritionist.  I recommend seeing [facility] for discussion of your toe concern (possible early hammertoe).
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
## 2012-10-30 LAB — MICROALBUMIN / CREATININE URINE RATIO: Creatinine, Urine: 38.7 mg/dL

## 2012-11-10 ENCOUNTER — Telehealth: Payer: Self-pay | Admitting: Family Medicine

## 2012-11-10 DIAGNOSIS — E119 Type 2 diabetes mellitus without complications: Secondary | ICD-10-CM

## 2012-11-10 DIAGNOSIS — E7800 Pure hypercholesterolemia, unspecified: Secondary | ICD-10-CM

## 2012-11-10 MED ORDER — METFORMIN HCL 1000 MG PO TABS: ORAL_TABLET | ORAL | Status: DC

## 2012-11-10 MED ORDER — LISINOPRIL 5 MG PO TABS: ORAL_TABLET | ORAL | Status: DC

## 2012-11-10 MED ORDER — ATORVASTATIN CALCIUM 20 MG PO TABS: ORAL_TABLET | ORAL | Status: DC

## 2012-11-10 MED ORDER — SAXAGLIPTIN HCL 5 MG PO TABS: 5.0000 mg | ORAL_TABLET | Freq: Every day | ORAL | Status: DC

## 2012-11-10 MED ORDER — GLIMEPIRIDE 1 MG PO TABS: ORAL_TABLET | ORAL | Status: DC

## 2012-11-10 NOTE — Telephone Encounter (Signed)
Patient called and stated that she gave Dr.Knapp the wrong mail order pharmacy at her visit on 10/29/12. Needs meds that were filled (5) resent to [facility]. I went ahead and re-ordered.

## 2012-11-12 NOTE — Telephone Encounter (Signed)
Frova P.A. Approved til 02/25/38

## 2012-11-24 ENCOUNTER — Telehealth: Payer: Self-pay | Admitting: Family Medicine

## 2012-11-24 NOTE — Telephone Encounter (Signed)
Please call.  Her Onglyza was sent to her mail order pharmacy in error. That Rx is supposed to go to local pharmacy.   HUGE price difference. $10 per month local store and 3 month supply was well over $100 +.  She did not receive her glimepiride 1mg    Please call

## 2012-11-24 NOTE — Telephone Encounter (Signed)
Please call and see what she needs done

## 2012-11-25 ENCOUNTER — Telehealth: Payer: Self-pay | Admitting: Family Medicine

## 2012-11-25 NOTE — Telephone Encounter (Signed)
Pt called yesterday and spoke with [place] and then to myself.  She states she had req refill on her meds to [facility] vs old mail order.  Suzette Battiest went ahead and refilled them based on the voice mail she received from pt.  Pt was upset because we refilled Ongliza, which was one to be refilled but she wanted it to go to a local pharmacy as it is 10.00 per month vs the 150.00 for 3 mo supply.  Roanne Haye had called [facility] to try and return the medications as the package is still sealed and they would not accept.  Pt said she had not refilled this med with [facility] in years and they still should not have had her credit card #.  Pt asked that I call [facility].  I called them this morning [phone] and spoke with Victorino Dike, she researched this and spoke with her supervisor and said they cannot accept the medication back.  She states they are working on [employer] for the patient from their phone call with her yesterday and hope to have some resolve for the pt.  I call pt back reached cell phone voice mail and left message.

## 2012-12-01 ENCOUNTER — Telehealth: Payer: Self-pay | Admitting: Family Medicine

## 2012-12-01 DIAGNOSIS — G43829 Menstrual migraine, not intractable, without status migrainosus: Secondary | ICD-10-CM

## 2012-12-01 MED ORDER — HYDROCODONE-ACETAMINOPHEN 5-500 MG PO TABS: 1.0000 | ORAL_TABLET | Freq: Four times a day (QID) | ORAL | Status: DC | PRN

## 2012-12-01 NOTE — Telephone Encounter (Signed)
lm

## 2012-12-01 NOTE — Telephone Encounter (Signed)
Signed.  Please advise pt

## 2012-12-08 ENCOUNTER — Encounter: Payer: BC Managed Care – PPO | Admitting: Family Medicine

## 2012-12-08 ENCOUNTER — Telehealth: Payer: Self-pay | Admitting: Family Medicine

## 2012-12-08 NOTE — Telephone Encounter (Signed)
Pt called and left message on my voice mail while I was up front approx 8:45 that she needed to reschedule her appt as she had an opportunity for telephone interview for a job at 9:00 and she had been out of work for about 2 months and needed to do this interview.  She apologized and said she would call back to reschedule.

## 2012-12-16 ENCOUNTER — Telehealth: Payer: Self-pay | Admitting: Family Medicine

## 2012-12-16 DIAGNOSIS — Z79899 Other long term (current) drug therapy: Secondary | ICD-10-CM

## 2012-12-16 DIAGNOSIS — E7800 Pure hypercholesterolemia, unspecified: Secondary | ICD-10-CM

## 2012-12-16 DIAGNOSIS — E119 Type 2 diabetes mellitus without complications: Secondary | ICD-10-CM

## 2012-12-16 NOTE — Telephone Encounter (Signed)
She is not due for any labs prior to February.  I put in orders for fasting labs to be done prior to her physical (vs her coming to CPE fasting--her choice).  Please ensure that this CPE is 45 minutes--this is her CPE and med check combined (since she same-day canceled her CPE earlier this month, when no med check was needed, she had one in September)

## 2013-01-29 ENCOUNTER — Other Ambulatory Visit: Payer: Self-pay | Admitting: *Deleted

## 2013-01-29 ENCOUNTER — Telehealth: Payer: Self-pay | Admitting: Internal Medicine

## 2013-01-29 DIAGNOSIS — G43829 Menstrual migraine, not intractable, without status migrainosus: Secondary | ICD-10-CM

## 2013-01-29 DIAGNOSIS — IMO0001 Reserved for inherently not codable concepts without codable children: Secondary | ICD-10-CM

## 2013-01-29 MED ORDER — NORGESTREL-ETHINYL ESTRADIOL 0.3-30 MG-MCG PO TABS: 1.0000 | ORAL_TABLET | Freq: Every day | ORAL | Status: DC

## 2013-01-29 NOTE — Telephone Encounter (Signed)
Done

## 2013-01-29 NOTE — Telephone Encounter (Signed)
Pt needs her birth control filled at a local pharmacy @ [facility] and then also to her mail order pharmacy. She has an appt for her physical on 04/23/13

## 2013-01-30 ENCOUNTER — Telehealth: Payer: Self-pay | Admitting: Family Medicine

## 2013-01-30 NOTE — Telephone Encounter (Signed)
complete

## 2013-04-23 ENCOUNTER — Encounter: Payer: BC Managed Care – PPO | Admitting: Family Medicine

## 2013-04-24 ENCOUNTER — Telehealth: Payer: Self-pay | Admitting: Family Medicine

## 2013-04-24 DIAGNOSIS — E119 Type 2 diabetes mellitus without complications: Secondary | ICD-10-CM

## 2013-04-24 MED ORDER — GLIMEPIRIDE 1 MG PO TABS: ORAL_TABLET | ORAL | Status: DC

## 2013-04-24 MED ORDER — METFORMIN HCL 1000 MG PO TABS: ORAL_TABLET | ORAL | Status: DC

## 2013-04-24 NOTE — Telephone Encounter (Signed)
Pt called and stated she doesn't have enough meds to last until her new appt date. She needs metformin and glimepiride sent to either her mail order ([facility]) or local pharmacy([facility]). I informed pt I  wasn't sure if you would refill for 90 or not due to appt reschedule. Pt also states she is out of medication for migraines. She is requesting a refill on vicodin. Pt was informed that she would need to pick up this rx and you were not in today. Pt states that her psychiatrist but her own a new med that is not recommended to take with frova. She states that she will talk to you concerning that a visit. She states that it is ok to take Vicodin.

## 2013-04-24 NOTE — Telephone Encounter (Signed)
Please change her appt for 3/9 to a 45 min visit (this is a CPE with breast/pelvic and a diabetes med check). I'm okay with refilling her Vicodin (she last got #30 in October), but needs to wait until Monday when I'm there to print/sign.  She also has future orders in the system, and I'd prefer, if possible, for her to have them done prior to her visit.  If not, that's fine (but orders are already there from her last visit, when she must have said she would come in prior).  Her diabetes meds were refilled through her mail order pharmacy for 90 days.

## 2013-05-04 ENCOUNTER — Ambulatory Visit (INDEPENDENT_AMBULATORY_CARE_PROVIDER_SITE_OTHER): Payer: BC Managed Care – PPO | Admitting: Family Medicine

## 2013-05-04 ENCOUNTER — Encounter: Payer: Self-pay | Admitting: Family Medicine

## 2013-05-04 VITALS — BP 132/76 | HR 76 | Ht 63.0 in | Wt 142.0 lb

## 2013-05-04 DIAGNOSIS — Z79899 Other long term (current) drug therapy: Secondary | ICD-10-CM

## 2013-05-04 DIAGNOSIS — G43909 Migraine, unspecified, not intractable, without status migrainosus: Secondary | ICD-10-CM

## 2013-05-04 DIAGNOSIS — G43829 Menstrual migraine, not intractable, without status migrainosus: Secondary | ICD-10-CM

## 2013-05-04 DIAGNOSIS — E119 Type 2 diabetes mellitus without complications: Secondary | ICD-10-CM

## 2013-05-04 DIAGNOSIS — Z0000 Encounter for general adult medical examination without abnormal findings: Secondary | ICD-10-CM

## 2013-05-04 DIAGNOSIS — IMO0001 Reserved for inherently not codable concepts without codable children: Secondary | ICD-10-CM

## 2013-05-04 DIAGNOSIS — E7800 Pure hypercholesterolemia, unspecified: Secondary | ICD-10-CM

## 2013-05-04 DIAGNOSIS — Z309 Encounter for contraceptive management, unspecified: Secondary | ICD-10-CM

## 2013-05-04 LAB — COMPREHENSIVE METABOLIC PANEL
ALBUMIN: 4.5 g/dL (ref 3.5–5.2)
ALT: 88 U/L — ABNORMAL HIGH (ref 0–35)
AST: 78 U/L — AB (ref 0–37)
Alkaline Phosphatase: 59 U/L (ref 39–117)
BUN: 15 mg/dL (ref 6–23)
CHLORIDE: 97 meq/L (ref 96–112)
CO2: 26 meq/L (ref 19–32)
Calcium: 9.1 mg/dL (ref 8.4–10.5)
Creat: 0.7 mg/dL (ref 0.50–1.10)
Glucose, Bld: 142 mg/dL — ABNORMAL HIGH (ref 70–99)
POTASSIUM: 4.6 meq/L (ref 3.5–5.3)
Sodium: 134 mEq/L — ABNORMAL LOW (ref 135–145)
TOTAL PROTEIN: 6.9 g/dL (ref 6.0–8.3)
Total Bilirubin: 0.3 mg/dL (ref 0.2–1.2)

## 2013-05-04 LAB — TSH: TSH: 4.343 u[IU]/mL (ref 0.350–4.500)

## 2013-05-04 LAB — POCT URINALYSIS DIPSTICK
Bilirubin, UA: NEGATIVE
GLUCOSE UA: NEGATIVE
KETONES UA: NEGATIVE
LEUKOCYTES UA: NEGATIVE
Nitrite, UA: NEGATIVE
Protein, UA: NEGATIVE
SPEC GRAV UA: 1.01
Urobilinogen, UA: NEGATIVE
pH, UA: 5

## 2013-05-04 LAB — CBC WITH DIFFERENTIAL/PLATELET
BASOS ABS: 0 10*3/uL (ref 0.0–0.1)
BASOS PCT: 0 % (ref 0–1)
Eosinophils Absolute: 0.2 10*3/uL (ref 0.0–0.7)
Eosinophils Relative: 2 % (ref 0–5)
HCT: 41.8 % (ref 36.0–46.0)
Hemoglobin: 14.2 g/dL (ref 12.0–15.0)
Lymphocytes Relative: 24 % (ref 12–46)
Lymphs Abs: 2.7 10*3/uL (ref 0.7–4.0)
MCH: 30.4 pg (ref 26.0–34.0)
MCHC: 34 g/dL (ref 30.0–36.0)
MCV: 89.5 fL (ref 78.0–100.0)
MONO ABS: 0.6 10*3/uL (ref 0.1–1.0)
Monocytes Relative: 5 % (ref 3–12)
Neutro Abs: 7.8 10*3/uL — ABNORMAL HIGH (ref 1.7–7.7)
Neutrophils Relative %: 69 % (ref 43–77)
PLATELETS: 373 10*3/uL (ref 150–400)
RBC: 4.67 MIL/uL (ref 3.87–5.11)
RDW: 13.4 % (ref 11.5–15.5)
WBC: 11.3 10*3/uL — ABNORMAL HIGH (ref 4.0–10.5)

## 2013-05-04 LAB — LIPID PANEL
Cholesterol: 155 mg/dL (ref 0–200)
HDL: 53 mg/dL (ref >39)
LDL CALC: 79 mg/dL (ref 0–99)
TRIGLYCERIDES: 116 mg/dL (ref <150)
Total CHOL/HDL Ratio: 2.9 Ratio
VLDL: 23 mg/dL (ref 0–40)

## 2013-05-04 LAB — POCT GLYCOSYLATED HEMOGLOBIN (HGB A1C): HEMOGLOBIN A1C: 6.5

## 2013-05-04 MED ORDER — NORGESTREL-ETHINYL ESTRADIOL 0.3-30 MG-MCG PO TABS: 1.0000 | ORAL_TABLET | Freq: Every day | ORAL | Status: DC

## 2013-05-04 MED ORDER — LISINOPRIL 5 MG PO TABS: ORAL_TABLET | ORAL | Status: DC

## 2013-05-04 MED ORDER — HYDROCODONE-ACETAMINOPHEN 5-300 MG PO TABS: 1.0000 | ORAL_TABLET | Freq: Four times a day (QID) | ORAL | Status: DC | PRN

## 2013-05-04 MED ORDER — ATORVASTATIN CALCIUM 20 MG PO TABS: ORAL_TABLET | ORAL | Status: DC

## 2013-05-04 MED ORDER — FROVATRIPTAN SUCCINATE 2.5 MG PO TABS
2.5000 mg | ORAL_TABLET | ORAL | Status: DC | PRN
Start: 2013-05-04 — End: 2014-02-15

## 2013-05-04 NOTE — Patient Instructions (Signed)
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
## 2013-05-06 ENCOUNTER — Other Ambulatory Visit: Payer: Self-pay | Admitting: *Deleted

## 2013-05-06 ENCOUNTER — Telehealth: Payer: Self-pay | Admitting: Family Medicine

## 2013-05-06 DIAGNOSIS — R110 Nausea: Secondary | ICD-10-CM

## 2013-05-06 DIAGNOSIS — R7989 Other specified abnormal findings of blood chemistry: Secondary | ICD-10-CM

## 2013-05-06 DIAGNOSIS — R945 Abnormal results of liver function studies: Secondary | ICD-10-CM

## 2013-05-06 DIAGNOSIS — E039 Hypothyroidism, unspecified: Secondary | ICD-10-CM

## 2013-05-06 MED ORDER — ONDANSETRON 4 MG PO TBDP: 4.0000 mg | ORAL_TABLET | Freq: Three times a day (TID) | ORAL | Status: DC | PRN

## 2013-05-06 NOTE — Telephone Encounter (Signed)
Advise pt rx for zofran was sent to her pharmacy

## 2013-05-06 NOTE — Telephone Encounter (Signed)
Patient informed. 

## 2013-05-06 NOTE — Telephone Encounter (Signed)
Pt called and states she has taken a Dramamine about 1 hour ago and it is not helping with the nausea.  She was not able to sleep at all last night due to the nausea.  It seems to come on late evening, she can't eat and is feeling week.  Please call her in something to [facility] on [address].  Pt ph [phone]

## 2013-05-20 ENCOUNTER — Telehealth: Payer: Self-pay | Admitting: *Deleted

## 2013-05-20 NOTE — Telephone Encounter (Signed)
She can keep a journal, to see if she can relate any particular food to her pain after eating (ie dairy, gluten, spicy,citrus),and avoid possible triggers.  Might take more time to get back to normal if related to her meds

## 2013-05-20 NOTE — Telephone Encounter (Signed)
Patient called and states that she saw Dr.Kaur and was taken off Fetzima, feels much better as far as sweating and heat issue. She still has no energy, craves salt and her stomach is not in good shape-still has pain especially a few hours after she eats. Was calling to see if you had any suggestions.

## 2013-05-21 NOTE — Telephone Encounter (Signed)
Patient advised.

## 2013-06-04 ENCOUNTER — Other Ambulatory Visit: Payer: Self-pay

## 2013-06-11 ENCOUNTER — Other Ambulatory Visit: Payer: BC Managed Care – PPO

## 2013-06-11 DIAGNOSIS — R945 Abnormal results of liver function studies: Secondary | ICD-10-CM

## 2013-06-11 DIAGNOSIS — R7989 Other specified abnormal findings of blood chemistry: Secondary | ICD-10-CM

## 2013-06-11 LAB — HEPATIC FUNCTION PANEL
ALK PHOS: 59 U/L (ref 39–117)
ALT: 34 U/L (ref 0–35)
AST: 32 U/L (ref 0–37)
Albumin: 4 g/dL (ref 3.5–5.2)
BILIRUBIN DIRECT: 0.1 mg/dL (ref 0.0–0.3)
BILIRUBIN INDIRECT: 0.3 mg/dL (ref 0.2–1.2)
BILIRUBIN TOTAL: 0.4 mg/dL (ref 0.2–1.2)
TOTAL PROTEIN: 6.8 g/dL (ref 6.0–8.3)

## 2013-07-21 ENCOUNTER — Other Ambulatory Visit: Payer: Self-pay | Admitting: Family Medicine

## 2013-07-21 DIAGNOSIS — E119 Type 2 diabetes mellitus without complications: Secondary | ICD-10-CM

## 2013-07-21 DIAGNOSIS — E7800 Pure hypercholesterolemia, unspecified: Secondary | ICD-10-CM

## 2013-07-21 MED ORDER — GLIMEPIRIDE 1 MG PO TABS: ORAL_TABLET | ORAL | Status: DC

## 2013-07-21 MED ORDER — METFORMIN HCL 1000 MG PO TABS: ORAL_TABLET | ORAL | Status: DC

## 2013-07-21 MED ORDER — LISINOPRIL 5 MG PO TABS: ORAL_TABLET | ORAL | Status: DC

## 2013-07-21 MED ORDER — ATORVASTATIN CALCIUM 20 MG PO TABS: ORAL_TABLET | ORAL | Status: DC

## 2013-08-03 ENCOUNTER — Encounter: Payer: Self-pay | Admitting: Family Medicine

## 2013-08-03 ENCOUNTER — Ambulatory Visit (INDEPENDENT_AMBULATORY_CARE_PROVIDER_SITE_OTHER): Payer: BC Managed Care – PPO | Admitting: Family Medicine

## 2013-08-03 VITALS — BP 140/84 | HR 100 | Temp 97.7°F

## 2013-08-03 DIAGNOSIS — K5289 Other specified noninfective gastroenteritis and colitis: Secondary | ICD-10-CM

## 2013-08-03 DIAGNOSIS — K529 Noninfective gastroenteritis and colitis, unspecified: Secondary | ICD-10-CM

## 2013-08-03 DIAGNOSIS — R110 Nausea: Secondary | ICD-10-CM

## 2013-08-03 DIAGNOSIS — R112 Nausea with vomiting, unspecified: Secondary | ICD-10-CM

## 2013-08-03 MED ORDER — PROMETHAZINE HCL 25 MG PO TABS: 25.0000 mg | ORAL_TABLET | Freq: Three times a day (TID) | ORAL | Status: DC | PRN

## 2013-08-03 MED ORDER — ONDANSETRON 4 MG PO TBDP: 4.0000 mg | ORAL_TABLET | Freq: Three times a day (TID) | ORAL | Status: DC | PRN

## 2013-08-03 NOTE — Patient Instructions (Addendum)
Viral Gastroenteritis Viral gastroenteritis is also known as stomach flu. This condition affects the stomach and intestinal tract. It can cause sudden diarrhea and vomiting. The illness typically lasts 3 to 8 days. Most people develop an immune response that eventually gets rid of the virus. While this natural response develops, the virus can make you quite ill. CAUSES  Many different viruses can cause gastroenteritis, such as rotavirus or noroviruses. You can catch one of these viruses by consuming contaminated food or water. You may also catch a virus by sharing utensils or other personal items with an infected person or by touching a contaminated surface. SYMPTOMS  The most common symptoms are diarrhea and vomiting. These problems can cause a severe loss of body fluids (dehydration) and a body salt (electrolyte) imbalance. Other symptoms may include:  Fever.  Headache.  Fatigue.  Abdominal pain. DIAGNOSIS  Your caregiver can usually diagnose viral gastroenteritis based on your symptoms and a physical exam. A stool sample may also be taken to test for the presence of viruses or other infections. TREATMENT  This illness typically goes away on its own. Treatments are aimed at rehydration. The most serious cases of viral gastroenteritis involve vomiting so severely that you are not able to keep fluids down. In these cases, fluids must be given through an intravenous line (IV). HOME CARE INSTRUCTIONS   Drink enough fluids to keep your urine clear or pale yellow. Drink small amounts of fluids frequently and increase the amounts as tolerated.  Ask your caregiver for specific rehydration instructions.  Avoid:  Foods high in sugar.  Alcohol.  Carbonated drinks.  Tobacco.  Juice.  Caffeine drinks.  Extremely hot or cold fluids.  Fatty, greasy foods.  Too much intake of anything at one time.  Dairy products until 24 to 48 hours after diarrhea stops.  You may consume probiotics.  Probiotics are active cultures of beneficial bacteria. They may lessen the amount and number of diarrheal stools in adults. Probiotics can be found in yogurt with active cultures and in supplements.  Wash your hands well to avoid spreading the virus.  Only take over-the-counter or prescription medicines for pain, discomfort, or fever as directed by your caregiver. Do not give aspirin to children. Antidiarrheal medicines are not recommended.  Ask your caregiver if you should continue to take your regular prescribed and over-the-counter medicines.  Keep all follow-up appointments as directed by your caregiver. SEEK IMMEDIATE MEDICAL CARE IF:   You are unable to keep fluids down.  You do not urinate at least once every 6 to 8 hours.  You develop shortness of breath.  You notice blood in your stool or vomit. This may look like coffee grounds.  You have abdominal pain that increases or is concentrated in one small area (localized).  You have persistent vomiting or diarrhea.  You have a fever.  The patient is a child younger than 3 months, and he or she has a fever.  The patient is a child older than 3 months, and he or she has a fever and persistent symptoms.  The patient is a child older than 3 months, and he or she has a fever and symptoms suddenly get worse.  The patient is a baby, and he or she has no tears when crying. MAKE SURE YOU:   Understand these instructions.  Will watch your condition.  Will get help right away if you are not doing well or get worse. Document Released: 02/12/2005 Document Revised: 05/07/2011 Document Reviewed: 11/29/2010   ExitCare Patient Information 2014 [place].  Slowly try and hydrate yourself--small, frequent sips. Avoid dairy--start with clear liquids, broth/soup, and advance diet as tolerated.    Use the medications to help with nausea.  If diarrhea increases in frequency/severity you may use Imodium as needed.  Use the phenergan if  zofran isn't effective, or if it is okay if you become sleepy (ie not when you need to drive).  If the 4mg  zofran is only partly effective, you could take 2 (max dose is 8mg  every 8 hours, if needed).  Try not to use both together.  Return if fever, abdominal pain, blood in stool. Go to ER if getting dehydrated--dizzy, little urine output, heart racing, dry mouth, etc
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
## 2013-08-06 ENCOUNTER — Other Ambulatory Visit: Payer: Self-pay

## 2013-08-25 ENCOUNTER — Telehealth: Payer: Self-pay

## 2013-08-25 DIAGNOSIS — Z309 Encounter for contraceptive management, unspecified: Secondary | ICD-10-CM

## 2013-08-25 NOTE — Telephone Encounter (Signed)
Pt states she is needing refills on her birth control [facility]

## 2013-08-26 ENCOUNTER — Encounter: Payer: Self-pay | Admitting: Family Medicine

## 2013-08-26 ENCOUNTER — Other Ambulatory Visit: Payer: Self-pay | Admitting: Family Medicine

## 2013-08-26 MED ORDER — NORGESTREL-ETHINYL ESTRADIOL 0.3-30 MG-MCG PO TABS: 1.0000 | ORAL_TABLET | Freq: Every day | ORAL | Status: DC

## 2013-08-26 MED ORDER — HYDROCODONE-ACETAMINOPHEN 5-300 MG PO TABS: 1.0000 | ORAL_TABLET | Freq: Four times a day (QID) | ORAL | Status: DC | PRN

## 2013-08-26 NOTE — Telephone Encounter (Signed)
error 

## 2013-08-26 NOTE — Telephone Encounter (Signed)
Error

## 2013-08-26 NOTE — Telephone Encounter (Signed)
Done

## 2013-08-31 ENCOUNTER — Other Ambulatory Visit: Payer: Self-pay | Admitting: Family Medicine

## 2013-09-01 ENCOUNTER — Other Ambulatory Visit: Payer: BC Managed Care – PPO

## 2013-09-01 DIAGNOSIS — E039 Hypothyroidism, unspecified: Secondary | ICD-10-CM

## 2013-09-02 LAB — TSH: TSH: 1.4 u[IU]/mL (ref 0.350–4.500)

## 2013-09-28 ENCOUNTER — Other Ambulatory Visit: Payer: Self-pay

## 2013-09-28 DIAGNOSIS — Z1231 Encounter for screening mammogram for malignant neoplasm of breast: Secondary | ICD-10-CM

## 2013-10-20 ENCOUNTER — Ambulatory Visit
Admission: RE | Admit: 2013-10-20 | Discharge: 2013-10-20 | Disposition: A | Discharge status: Home / Self Care | Payer: BC Managed Care – PPO | Source: Ambulatory Visit

## 2013-10-20 DIAGNOSIS — Z1231 Encounter for screening mammogram for malignant neoplasm of breast: Secondary | ICD-10-CM

## 2013-11-03 ENCOUNTER — Telehealth: Payer: Self-pay | Admitting: Family Medicine

## 2013-11-03 DIAGNOSIS — E7800 Pure hypercholesterolemia, unspecified: Secondary | ICD-10-CM

## 2013-11-03 DIAGNOSIS — E119 Type 2 diabetes mellitus without complications: Secondary | ICD-10-CM

## 2013-11-04 MED ORDER — METFORMIN HCL 1000 MG PO TABS: ORAL_TABLET | ORAL | Status: DC

## 2013-11-04 MED ORDER — GLIMEPIRIDE 1 MG PO TABS: ORAL_TABLET | ORAL | Status: DC

## 2013-11-04 MED ORDER — LISINOPRIL 5 MG PO TABS: ORAL_TABLET | ORAL | Status: DC

## 2013-11-04 MED ORDER — ATORVASTATIN CALCIUM 20 MG PO TABS: ORAL_TABLET | ORAL | Status: DC

## 2013-11-04 NOTE — Telephone Encounter (Signed)
Called patient to see if she truly needed these meds as she has a med check scheduled 11/17/13-see truly needs so I will send for her.

## 2013-11-14 ENCOUNTER — Other Ambulatory Visit: Payer: Self-pay | Admitting: Family Medicine

## 2013-11-17 ENCOUNTER — Encounter: Payer: Self-pay | Admitting: Family Medicine

## 2013-11-17 ENCOUNTER — Ambulatory Visit (INDEPENDENT_AMBULATORY_CARE_PROVIDER_SITE_OTHER): Payer: BC Managed Care – PPO | Admitting: Family Medicine

## 2013-11-17 VITALS — BP 102/62 | HR 76 | Ht 63.0 in | Wt 139.0 lb

## 2013-11-17 DIAGNOSIS — L700 Acne vulgaris: Secondary | ICD-10-CM

## 2013-11-17 DIAGNOSIS — G43009 Migraine without aura, not intractable, without status migrainosus: Secondary | ICD-10-CM

## 2013-11-17 DIAGNOSIS — Z23 Encounter for immunization: Secondary | ICD-10-CM

## 2013-11-17 DIAGNOSIS — Z309 Encounter for contraceptive management, unspecified: Secondary | ICD-10-CM

## 2013-11-17 DIAGNOSIS — E7800 Pure hypercholesterolemia, unspecified: Secondary | ICD-10-CM

## 2013-11-17 DIAGNOSIS — L708 Other acne: Secondary | ICD-10-CM

## 2013-11-17 DIAGNOSIS — E119 Type 2 diabetes mellitus without complications: Secondary | ICD-10-CM

## 2013-11-17 LAB — POCT GLYCOSYLATED HEMOGLOBIN (HGB A1C): Hemoglobin A1C: 7.7

## 2013-11-17 LAB — LIPID PANEL
CHOL/HDL RATIO: 2.4 ratio
Cholesterol: 120 mg/dL (ref 0–200)
HDL: 50 mg/dL (ref >39)
LDL CALC: 48 mg/dL (ref 0–99)
Triglycerides: 109 mg/dL (ref <150)
VLDL: 22 mg/dL (ref 0–40)

## 2013-11-17 LAB — COMPREHENSIVE METABOLIC PANEL
ALK PHOS: 64 U/L (ref 39–117)
ALT: 21 U/L (ref 0–35)
AST: 17 U/L (ref 0–37)
Albumin: 4 g/dL (ref 3.5–5.2)
BUN: 11 mg/dL (ref 6–23)
CALCIUM: 9.4 mg/dL (ref 8.4–10.5)
CO2: 29 mEq/L (ref 19–32)
CREATININE: 0.71 mg/dL (ref 0.50–1.10)
Chloride: 95 mEq/L — ABNORMAL LOW (ref 96–112)
Glucose, Bld: 231 mg/dL — ABNORMAL HIGH (ref 70–99)
POTASSIUM: 4.8 meq/L (ref 3.5–5.3)
Sodium: 134 mEq/L — ABNORMAL LOW (ref 135–145)
Total Bilirubin: 0.4 mg/dL (ref 0.2–1.2)
Total Protein: 6.6 g/dL (ref 6.0–8.3)

## 2013-11-17 MED ORDER — TRETINOIN 0.025 % EX CREA: TOPICAL_CREAM | CUTANEOUS | Status: DC

## 2013-11-17 MED ORDER — NORGESTREL-ETHINYL ESTRADIOL 0.3-30 MG-MCG PO TABS: 1.0000 | ORAL_TABLET | Freq: Every day | ORAL | Status: DC

## 2013-11-17 NOTE — Patient Instructions (Signed)
  Your diabetes is not currently well controlled.  At this point, I'm not recommending changing your medications, but to get back on track with checking your sugars regularly--in the mornings, and a few times a week 2 hours after dinner/bedtime.  Try and get back to your more normal routine/diet. Exercise at least 30 minutes daily (goal of 150-180 mins/week minimum). Return in 3 months for recheck (nonfasting for A1c).  If A1c remains >7, then we may need to adjust your medications.  Please have your eye doctor fax [place] your recent visit (note not received here).
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
## 2013-11-18 ENCOUNTER — Encounter: Payer: BC Managed Care – PPO | Admitting: Family Medicine

## 2013-11-18 LAB — MICROALBUMIN / CREATININE URINE RATIO
CREATININE, URINE: 170.4 mg/dL
MICROALB UR: 1 mg/dL (ref <2.0)
Microalb Creat Ratio: 5.9 mg/g (ref 0.0–30.0)

## 2013-11-27 LAB — HM DIABETES EYE EXAM

## 2013-11-28 ENCOUNTER — Telehealth: Payer: Self-pay | Admitting: Family Medicine

## 2013-12-01 ENCOUNTER — Telehealth: Payer: Self-pay | Admitting: Family Medicine

## 2013-12-01 NOTE — Telephone Encounter (Signed)
Pt needs RF diabetic test strips One Touch Ultra to [facility]

## 2013-12-01 NOTE — Telephone Encounter (Signed)
P.A. Approved Tretinoin til 11/28/15, faxed pharmacy, pt informed.

## 2013-12-02 ENCOUNTER — Encounter: Payer: Self-pay | Admitting: *Deleted

## 2013-12-02 MED ORDER — GLUCOSE BLOOD VI STRP: ORAL_STRIP | Status: AC

## 2013-12-02 NOTE — Telephone Encounter (Signed)
Done

## 2013-12-17 ENCOUNTER — Other Ambulatory Visit: Payer: Self-pay | Admitting: Family Medicine

## 2013-12-17 MED ORDER — SAXAGLIPTIN HCL 5 MG PO TABS: ORAL_TABLET | ORAL | Status: DC

## 2013-12-28 ENCOUNTER — Encounter: Payer: Self-pay | Admitting: Family Medicine

## 2014-01-25 ENCOUNTER — Telehealth: Payer: Self-pay | Admitting: Family Medicine

## 2014-01-25 NOTE — Telephone Encounter (Signed)
Noted.  Continue regular monitoring, and be sure to bring her list to her visit next month.  F/u sooner if consistently elevated

## 2014-01-25 NOTE — Telephone Encounter (Signed)
Patient advised.

## 2014-01-25 NOTE — Telephone Encounter (Signed)
Pt's blood sugar was elevated last night. She monitored it all night and this morning. It is back to normal this morning. She has not eaten anything out of the norm. She has been having trouble sleeping (broken sleep). Had a cold last week.  Blood sugar readings:  11/29  10:05pm  479            10:07pm  447            11:07pm  342   11/30                  159  Before food & meds

## 2014-02-05 ENCOUNTER — Other Ambulatory Visit: Payer: Self-pay | Admitting: Family Medicine

## 2014-02-06 DIAGNOSIS — Z77120 Contact with and (suspected) exposure to mold (toxic): Secondary | ICD-10-CM | POA: Insufficient documentation

## 2014-02-08 ENCOUNTER — Other Ambulatory Visit: Payer: Self-pay | Admitting: *Deleted

## 2014-02-08 DIAGNOSIS — E119 Type 2 diabetes mellitus without complications: Secondary | ICD-10-CM

## 2014-02-08 DIAGNOSIS — Z309 Encounter for contraceptive management, unspecified: Secondary | ICD-10-CM

## 2014-02-08 DIAGNOSIS — E7800 Pure hypercholesterolemia, unspecified: Secondary | ICD-10-CM

## 2014-02-08 MED ORDER — GLIMEPIRIDE 1 MG PO TABS: ORAL_TABLET | ORAL | Status: DC

## 2014-02-08 MED ORDER — METFORMIN HCL 1000 MG PO TABS: ORAL_TABLET | ORAL | Status: DC

## 2014-02-08 MED ORDER — LISINOPRIL 5 MG PO TABS: ORAL_TABLET | ORAL | Status: DC

## 2014-02-08 MED ORDER — ATORVASTATIN CALCIUM 20 MG PO TABS: ORAL_TABLET | ORAL | Status: DC

## 2014-02-08 MED ORDER — NORGESTREL-ETHINYL ESTRADIOL 0.3-30 MG-MCG PO TABS: 1.0000 | ORAL_TABLET | Freq: Every day | ORAL | Status: DC

## 2014-02-14 ENCOUNTER — Other Ambulatory Visit: Payer: Self-pay | Admitting: Family Medicine

## 2014-02-15 ENCOUNTER — Encounter: Payer: Self-pay | Admitting: Family Medicine

## 2014-02-15 ENCOUNTER — Ambulatory Visit (INDEPENDENT_AMBULATORY_CARE_PROVIDER_SITE_OTHER): Payer: BC Managed Care – PPO | Admitting: Family Medicine

## 2014-02-15 VITALS — BP 128/80 | HR 92 | Ht 63.0 in | Wt 135.0 lb

## 2014-02-15 DIAGNOSIS — E1165 Type 2 diabetes mellitus with hyperglycemia: Secondary | ICD-10-CM

## 2014-02-15 DIAGNOSIS — R112 Nausea with vomiting, unspecified: Secondary | ICD-10-CM

## 2014-02-15 DIAGNOSIS — G43009 Migraine without aura, not intractable, without status migrainosus: Secondary | ICD-10-CM

## 2014-02-15 DIAGNOSIS — IMO0002 Reserved for concepts with insufficient information to code with codable children: Secondary | ICD-10-CM

## 2014-02-15 LAB — POCT GLYCOSYLATED HEMOGLOBIN (HGB A1C): Hemoglobin A1C: 8.2

## 2014-02-15 MED ORDER — SAXAGLIPTIN HCL 5 MG PO TABS: ORAL_TABLET | ORAL | Status: DC

## 2014-02-15 MED ORDER — HYDROCODONE-ACETAMINOPHEN 5-300 MG PO TABS: 1.0000 | ORAL_TABLET | Freq: Four times a day (QID) | ORAL | Status: DC | PRN

## 2014-02-15 MED ORDER — PROMETHAZINE HCL 25 MG PO TABS: 25.0000 mg | ORAL_TABLET | Freq: Three times a day (TID) | ORAL | Status: DC | PRN

## 2014-02-15 MED ORDER — FROVATRIPTAN SUCCINATE 2.5 MG PO TABS: 2.5000 mg | ORAL_TABLET | ORAL | Status: DC | PRN
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
## 2014-02-15 NOTE — Patient Instructions (Addendum)
  Discussed switching to Glyxambi-- onglyza would change to tradjenta, and adding jardiance (new class of medication to lower sugars--by excreting excess sugar in the urine).  Versus the other option is continuing onglyza, adding Jardiance, and eventually switching (rather than changing two meds once).    If there is any hypoglycemia while on the new medication, stop the glimepiride, and continue the other medications.  We can prescribe a diflucan pill along with the new class of medication--to be used only if needed for symptoms of yeast infection.  (I don't usually prescribe it along with it, but I know this will help ease your fear/anxiety of getting an infection).   Call and let us know if/when you decide you would like to try the new medication, and which way you would like to do it (adding Jardiance to current regimen, vs switching Onglyza to [facility]).  Either way, you would continue the metformin.  You would also continue the glimepiride--stopping it only if you develop low blood sugars.

## 2014-05-05 ENCOUNTER — Encounter: Payer: Self-pay | Admitting: Family Medicine

## 2014-05-05 DIAGNOSIS — E7800 Pure hypercholesterolemia, unspecified: Secondary | ICD-10-CM

## 2014-05-05 DIAGNOSIS — Z309 Encounter for contraceptive management, unspecified: Secondary | ICD-10-CM

## 2014-05-05 DIAGNOSIS — E119 Type 2 diabetes mellitus without complications: Secondary | ICD-10-CM

## 2014-05-05 MED ORDER — GLIMEPIRIDE 1 MG PO TABS: ORAL_TABLET | ORAL | Status: DC

## 2014-05-05 MED ORDER — NORGESTREL-ETHINYL ESTRADIOL 0.3-30 MG-MCG PO TABS: 1.0000 | ORAL_TABLET | Freq: Every day | ORAL | Status: DC

## 2014-05-05 MED ORDER — LISINOPRIL 5 MG PO TABS: ORAL_TABLET | ORAL | Status: DC

## 2014-05-05 MED ORDER — METFORMIN HCL 1000 MG PO TABS: ORAL_TABLET | ORAL | Status: DC

## 2014-05-05 MED ORDER — ATORVASTATIN CALCIUM 20 MG PO TABS: ORAL_TABLET | ORAL | Status: DC

## 2014-05-05 NOTE — Telephone Encounter (Signed)
Sent meds to [id number] no refills

## 2014-05-12 ENCOUNTER — Encounter: Payer: BC Managed Care – PPO | Admitting: Family Medicine

## 2014-05-26 ENCOUNTER — Other Ambulatory Visit (HOSPITAL_COMMUNITY)
Admission: RE | Admit: 2014-05-26 | Discharge: 2014-05-26 | Disposition: A | Discharge status: Home / Self Care | Payer: BLUE CROSS/BLUE SHIELD | Source: Ambulatory Visit | Attending: Family Medicine | Admitting: Family Medicine

## 2014-05-26 ENCOUNTER — Encounter: Payer: Self-pay | Admitting: Family Medicine

## 2014-05-26 ENCOUNTER — Ambulatory Visit (INDEPENDENT_AMBULATORY_CARE_PROVIDER_SITE_OTHER): Payer: BLUE CROSS/BLUE SHIELD | Admitting: Family Medicine

## 2014-05-26 VITALS — BP 122/80 | HR 80 | Ht 63.75 in | Wt 142.2 lb

## 2014-05-26 DIAGNOSIS — R112 Nausea with vomiting, unspecified: Secondary | ICD-10-CM | POA: Diagnosis not present

## 2014-05-26 DIAGNOSIS — Z0000 Encounter for general adult medical examination without abnormal findings: Secondary | ICD-10-CM

## 2014-05-26 DIAGNOSIS — E7800 Pure hypercholesterolemia, unspecified: Secondary | ICD-10-CM

## 2014-05-26 DIAGNOSIS — E119 Type 2 diabetes mellitus without complications: Secondary | ICD-10-CM

## 2014-05-26 DIAGNOSIS — G43009 Migraine without aura, not intractable, without status migrainosus: Secondary | ICD-10-CM | POA: Diagnosis not present

## 2014-05-26 DIAGNOSIS — L700 Acne vulgaris: Secondary | ICD-10-CM

## 2014-05-26 DIAGNOSIS — Z1151 Encounter for screening for human papillomavirus (HPV): Secondary | ICD-10-CM | POA: Diagnosis present

## 2014-05-26 DIAGNOSIS — Z01419 Encounter for gynecological examination (general) (routine) without abnormal findings: Secondary | ICD-10-CM | POA: Diagnosis present

## 2014-05-26 LAB — COMPREHENSIVE METABOLIC PANEL
ALT: 24 U/L (ref 0–35)
AST: 21 U/L (ref 0–37)
Albumin: 4 g/dL (ref 3.5–5.2)
Alkaline Phosphatase: 57 U/L (ref 39–117)
BUN: 12 mg/dL (ref 6–23)
CO2: 24 meq/L (ref 19–32)
CREATININE: 0.78 mg/dL (ref 0.50–1.10)
Calcium: 8.9 mg/dL (ref 8.4–10.5)
Chloride: 99 mEq/L (ref 96–112)
GLUCOSE: 176 mg/dL — AB (ref 70–99)
Potassium: 4 mEq/L (ref 3.5–5.3)
Sodium: 135 mEq/L (ref 135–145)
TOTAL PROTEIN: 6.7 g/dL (ref 6.0–8.3)
Total Bilirubin: 0.5 mg/dL (ref 0.2–1.2)

## 2014-05-26 LAB — POCT GLYCOSYLATED HEMOGLOBIN (HGB A1C)

## 2014-05-26 LAB — TSH: TSH: 3.155 u[IU]/mL (ref 0.350–4.500)

## 2014-05-26 MED ORDER — PROMETHAZINE HCL 25 MG PO TABS: 25.0000 mg | ORAL_TABLET | Freq: Three times a day (TID) | ORAL | Status: DC | PRN

## 2014-05-26 MED ORDER — TRETINOIN 0.025 % EX CREA: TOPICAL_CREAM | CUTANEOUS | Status: AC

## 2014-05-26 MED ORDER — HYDROCODONE-ACETAMINOPHEN 5-300 MG PO TABS: 1.0000 | ORAL_TABLET | Freq: Four times a day (QID) | ORAL | Status: DC | PRN

## 2014-05-26 MED ORDER — EMPAGLIFLOZIN-LINAGLIPTIN 10-5 MG PO TABS
1.0000 | ORAL_TABLET | ORAL | Status: DC
Start: 2014-05-26 — End: 2014-06-17
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
## 2014-05-26 NOTE — Progress Notes (Signed)
Patient was given a 28 day sample of glyxambi 10mg /5mg  to try out and was given a discount card with it. She is to STOP onglyza 5mg . She is to call back and tell us how she is doing on the medicine so we can send in a rx for it.

## 2014-05-26 NOTE — Patient Instructions (Addendum)
  HEALTH MAINTENANCE RECOMMENDATIONS:  It is recommended that you get at least 30 minutes of aerobic exercise at least 5 days/week (for weight loss, you may need as much as 60-90 minutes). This can be any activity that gets your heart rate up. This can be divided in 10-15 minute intervals if needed, but try and build up your endurance at least once a week.  Weight bearing exercise is also recommended twice weekly.  Eat a healthy diet with lots of vegetables, fruits and fiber.  "Colorful" foods have a lot of vitamins (ie green vegetables, tomatoes, red peppers, etc).  Limit sweet tea, regular sodas and alcoholic beverages, all of which has a lot of calories and sugar.  Up to 1 alcoholic drink daily may be beneficial for women (unless trying to lose weight, watch sugars).  Drink a lot of water.  Calcium recommendations are 1200-1500 mg daily (1500 mg for postmenopausal women or women without ovaries), and vitamin D 1000 IU daily.  This should be obtained from diet and/or supplements (vitamins), and calcium should not be taken all at once, but in divided doses.  Monthly self breast exams and yearly mammograms for women over the age of 23 is recommended.  Sunscreen of at least SPF 30 should be used on all sun-exposed parts of the skin when outside between the hours of 10 am and 4 pm (not just when at beach or pool, but even with exercise, golf, tennis, and yard work!)  Use a sunscreen that says "broad spectrum" so it covers both UVA and UVB rays, and make sure to reapply every 1-2 hours.  Remember to change the batteries in your smoke detectors when changing your clock times in the spring and fall.  Use your seat belt every time you are in a car, and please drive safely and not be distracted with cell phones and texting while driving.   Migraines--given frequency and duration, I think it is time to consider a preventative medication.  Nortriptylene (pamelor) 10mg  at bedtime, or starting topamax are two  options.  You should probably discuss these with your psychiatrist and see if she has a preference, given the other medications that you take.  Keep working on stress reduction techniques, as this might also help with headaches. Consider biofeedback or other alternative treatments. I am also happy to send you to a headache clinic for further evaluation and medication treatment. Let me know.

## 2014-05-27 ENCOUNTER — Encounter: Payer: Self-pay | Admitting: Family Medicine

## 2014-05-27 LAB — CYTOLOGY - PAP

## 2014-05-27 MED ORDER — EMPAGLIFLOZIN-LINAGLIPTIN 10-5 MG PO TABS: 1.0000 | ORAL_TABLET | Freq: Every day | ORAL | Status: DC

## 2014-05-27 NOTE — Addendum Note (Signed)
Addended by: Minette Headland A on: 05/27/2014 08:11 AM   Modules accepted: Orders

## 2014-05-31 ENCOUNTER — Telehealth: Payer: Self-pay | Admitting: Family Medicine

## 2014-05-31 NOTE — Telephone Encounter (Signed)
I called pt & she states there was a problem with insurance payment & they were with holding her pharmacy benefits but everything is straight now and no P.A. required

## 2014-06-01 ENCOUNTER — Encounter: Payer: Self-pay | Admitting: Family Medicine

## 2014-06-10 ENCOUNTER — Encounter: Payer: Self-pay | Admitting: Family Medicine

## 2014-06-15 ENCOUNTER — Telehealth: Payer: Self-pay | Admitting: Family Medicine

## 2014-06-17 MED ORDER — CANAGLIFLOZIN 100 MG PO TABS: 100.0000 mg | ORAL_TABLET | Freq: Every day | ORAL | Status: DC

## 2014-06-17 MED ORDER — SAXAGLIPTIN HCL 5 MG PO TABS: 5.0000 mg | ORAL_TABLET | Freq: Every day | ORAL | Status: DC

## 2014-06-17 NOTE — Telephone Encounter (Signed)
P.A. Judye Bos denied, pt needs trial of Invokana.  Do you want to switch?

## 2014-06-17 NOTE — Telephone Encounter (Signed)
Advise pt that the glyxambi isn't covered. Will need to switch back to the onglyza 5mg , stop glyxambi and change to Invokana. Invokana dosing is different--start at 100mg , then increase up to the higher dose at f/u if needed.  So start with 100mg  dose.  Take once daily, before first meal of the day.

## 2014-06-17 NOTE — Telephone Encounter (Signed)
Patient notified that Glyxambi not covered. She will go back to the Onglyza 5mg  daily and adding Invokana 100mg  once daily-take BEFORE first meal of the day. Given #10 samples and patient will call when she is ready for rx to be sent in. She did need new rx for Onglyza sent to [facility].

## 2014-07-01 ENCOUNTER — Encounter: Payer: Self-pay | Admitting: Family Medicine

## 2014-07-01 ENCOUNTER — Other Ambulatory Visit: Payer: Self-pay | Admitting: *Deleted

## 2014-07-01 MED ORDER — CANAGLIFLOZIN 100 MG PO TABS: 100.0000 mg | ORAL_TABLET | Freq: Every day | ORAL | Status: DC

## 2014-07-11 ENCOUNTER — Encounter: Payer: Self-pay | Admitting: Family Medicine

## 2014-08-02 ENCOUNTER — Other Ambulatory Visit: Payer: Self-pay | Admitting: Family Medicine

## 2014-08-02 DIAGNOSIS — Z309 Encounter for contraceptive management, unspecified: Secondary | ICD-10-CM

## 2014-08-02 MED ORDER — NORGESTREL-ETHINYL ESTRADIOL 0.3-30 MG-MCG PO TABS: 1.0000 | ORAL_TABLET | Freq: Every day | ORAL | Status: DC

## 2014-08-03 ENCOUNTER — Other Ambulatory Visit: Payer: Self-pay | Admitting: Family Medicine

## 2014-08-03 DIAGNOSIS — E7800 Pure hypercholesterolemia, unspecified: Secondary | ICD-10-CM

## 2014-08-03 DIAGNOSIS — E119 Type 2 diabetes mellitus without complications: Secondary | ICD-10-CM

## 2014-08-03 MED ORDER — ATORVASTATIN CALCIUM 20 MG PO TABS: ORAL_TABLET | ORAL | Status: DC

## 2014-08-03 MED ORDER — METFORMIN HCL 1000 MG PO TABS: ORAL_TABLET | ORAL | Status: DC

## 2014-08-03 MED ORDER — GLIMEPIRIDE 1 MG PO TABS
ORAL_TABLET | ORAL | Status: DC
Start: 2014-08-03 — End: 2014-11-08

## 2014-08-03 MED ORDER — LISINOPRIL 5 MG PO TABS: ORAL_TABLET | ORAL | Status: DC

## 2014-08-03 NOTE — Telephone Encounter (Signed)
done

## 2014-08-23 ENCOUNTER — Other Ambulatory Visit: Payer: Self-pay

## 2014-09-04 ENCOUNTER — Other Ambulatory Visit: Payer: Self-pay | Admitting: Family Medicine

## 2014-09-06 ENCOUNTER — Encounter: Payer: Self-pay | Admitting: Family Medicine

## 2014-09-06 ENCOUNTER — Ambulatory Visit (INDEPENDENT_AMBULATORY_CARE_PROVIDER_SITE_OTHER): Payer: BLUE CROSS/BLUE SHIELD | Admitting: Family Medicine

## 2014-09-06 VITALS — BP 102/68 | HR 84 | Ht 63.75 in | Wt 140.0 lb

## 2014-09-06 DIAGNOSIS — R61 Generalized hyperhidrosis: Secondary | ICD-10-CM | POA: Diagnosis not present

## 2014-09-06 DIAGNOSIS — Z77120 Contact with and (suspected) exposure to mold (toxic): Secondary | ICD-10-CM

## 2014-09-06 DIAGNOSIS — G43009 Migraine without aura, not intractable, without status migrainosus: Secondary | ICD-10-CM

## 2014-09-06 DIAGNOSIS — E119 Type 2 diabetes mellitus without complications: Secondary | ICD-10-CM | POA: Diagnosis not present

## 2014-09-06 LAB — POCT GLYCOSYLATED HEMOGLOBIN (HGB A1C): HEMOGLOBIN A1C: 7

## 2014-09-06 MED ORDER — CANAGLIFLOZIN 100 MG PO TABS: 100.0000 mg | ORAL_TABLET | Freq: Every day | ORAL | Status: DC

## 2014-09-06 MED ORDER — HYDROCODONE-ACETAMINOPHEN 5-300 MG PO TABS: 1.0000 | ORAL_TABLET | Freq: Four times a day (QID) | ORAL | Status: DC | PRN

## 2014-09-06 NOTE — Patient Instructions (Signed)
Your diabetes control has improved on the current regimen.  Your A1c today was 7.0%.  I recommend staying on the 100mg  of invokana, having room to increase the dose in the future, if needed (let us know if sugars are consistently elevated for a month,--fasting sugars >140-150, 2 hours after eating >170-180 consistently). Continue all of your current medications.  I recommend starting enteric coated 81mg  of aspirin once daily.  If this causes any problems (significant bleeding, bruising or stomach complaints) then stop it.
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
## 2014-09-07 LAB — CBC WITH DIFFERENTIAL/PLATELET
Basophils Absolute: 0.1 10*3/uL (ref 0.0–0.1)
Basophils Relative: 1 % (ref 0–1)
Eosinophils Absolute: 0.2 10*3/uL (ref 0.0–0.7)
Eosinophils Relative: 2 % (ref 0–5)
HCT: 41.5 % (ref 36.0–46.0)
Hemoglobin: 13.7 g/dL (ref 12.0–15.0)
LYMPHS ABS: 1.8 10*3/uL (ref 0.7–4.0)
Lymphocytes Relative: 22 % (ref 12–46)
MCH: 29.3 pg (ref 26.0–34.0)
MCHC: 33 g/dL (ref 30.0–36.0)
MCV: 88.9 fL (ref 78.0–100.0)
MPV: 10.3 fL (ref 8.6–12.4)
Monocytes Absolute: 0.4 10*3/uL (ref 0.1–1.0)
Monocytes Relative: 5 % (ref 3–12)
NEUTROS ABS: 5.8 10*3/uL (ref 1.7–7.7)
Neutrophils Relative %: 70 % (ref 43–77)
Platelets: 297 10*3/uL (ref 150–400)
RBC: 4.67 MIL/uL (ref 3.87–5.11)
RDW: 13.2 % (ref 11.5–15.5)
WBC: 8.3 10*3/uL (ref 4.0–10.5)

## 2014-09-21 ENCOUNTER — Encounter: Payer: Self-pay | Admitting: Family Medicine

## 2014-09-22 ENCOUNTER — Other Ambulatory Visit: Payer: Self-pay | Admitting: *Deleted

## 2014-09-22 MED ORDER — SAXAGLIPTIN HCL 5 MG PO TABS: 5.0000 mg | ORAL_TABLET | Freq: Every day | ORAL | Status: DC

## 2014-09-28 ENCOUNTER — Encounter: Payer: Self-pay | Admitting: Family Medicine

## 2014-10-04 ENCOUNTER — Telehealth: Payer: Self-pay | Admitting: Family Medicine

## 2014-10-04 NOTE — Telephone Encounter (Signed)
Received an Email today from pt requesting Lovaza fish oil as she does not eat fish of any kind.  I have placed her message in your folder.

## 2014-10-04 NOTE — Telephone Encounter (Signed)
Advise pt that I have read her highlighted e-mail, but feel that she has misread it.  It is not indicated for controlling blood sugar or regulating thyroid hormone levels.  It is indicate to use it after your BLOOD FAT LEVELS HAVE NOT BEEN FULLY CONTROLLED BY NON-DRUG TREATMENTS)--this means controlling diabetes, thyroid, cutting back on alcohol, etc--these are things that can contribute to high TG, and once these have been adjusted, if TG remains high, THEN lovaza is indicated. (see e-mail printed in red folder)  I truly don't have a problem with her taking them, I don't think there is a harm, however I don't think it would be covered by insurance, and if it is, it still isn't free.  She truly doesn't have an indication to take it.

## 2014-10-04 NOTE — Telephone Encounter (Signed)
Left message for patient to call back  

## 2014-10-05 ENCOUNTER — Telehealth: Payer: Self-pay | Admitting: Family Medicine

## 2014-10-05 NOTE — Telephone Encounter (Signed)
Pt returned call to [place]. Also wants to know if Verdene Lennert knows if she will need a referral from [place] to a diabetes Optometrist. She would like to go to [facility] for diabetes consulting

## 2014-10-06 ENCOUNTER — Telehealth: Payer: Self-pay | Admitting: Family Medicine

## 2014-10-06 NOTE — Telephone Encounter (Signed)
Pt called yesterday and was very concerned about her diabetes and wants a referral to [facility].  She also wants to be given rx for Lovaza.  Please advise pt when done

## 2014-10-07 ENCOUNTER — Other Ambulatory Visit: Payer: Self-pay | Admitting: *Deleted

## 2014-10-07 ENCOUNTER — Encounter: Payer: Self-pay | Admitting: *Deleted

## 2014-10-07 MED ORDER — OMEGA-3-ACID ETHYL ESTERS 1 G PO CAPS: 2.0000 g | ORAL_CAPSULE | Freq: Two times a day (BID) | ORAL | Status: DC

## 2014-10-07 NOTE — Telephone Encounter (Signed)
Spoke with patient, sent rx for Lovaza and started the referral to [facility] [facility].

## 2014-10-13 ENCOUNTER — Encounter: Payer: Self-pay | Admitting: *Deleted

## 2014-10-20 ENCOUNTER — Other Ambulatory Visit: Payer: Self-pay | Admitting: *Deleted

## 2014-10-20 DIAGNOSIS — Z309 Encounter for contraceptive management, unspecified: Secondary | ICD-10-CM

## 2014-10-20 MED ORDER — NORGESTREL-ETHINYL ESTRADIOL 0.3-30 MG-MCG PO TABS: 1.0000 | ORAL_TABLET | Freq: Every day | ORAL | Status: DC

## 2014-10-21 ENCOUNTER — Telehealth: Payer: Self-pay | Admitting: *Deleted

## 2014-10-21 NOTE — Telephone Encounter (Signed)
noted 

## 2014-10-21 NOTE — Telephone Encounter (Signed)
Just wanted to give you an FYI-patient does want to go ahead and try to see Dr.South for diabetes-this is the only way she can possibly see Tivis Ringer. Sent referral over, they told me that Dr. Forde Dandy usually takes up to 2 months to review and decide. Patient did not want to see anyone at [facility] wants to see Tivis Ringer.

## 2014-11-05 ENCOUNTER — Other Ambulatory Visit: Payer: Self-pay | Admitting: Family Medicine

## 2014-11-05 DIAGNOSIS — E039 Hypothyroidism, unspecified: Secondary | ICD-10-CM | POA: Insufficient documentation

## 2014-11-07 ENCOUNTER — Other Ambulatory Visit: Payer: Self-pay | Admitting: Family Medicine

## 2014-11-08 ENCOUNTER — Other Ambulatory Visit: Payer: Self-pay | Admitting: *Deleted

## 2014-11-08 DIAGNOSIS — E119 Type 2 diabetes mellitus without complications: Secondary | ICD-10-CM

## 2014-11-08 DIAGNOSIS — E7800 Pure hypercholesterolemia, unspecified: Secondary | ICD-10-CM

## 2014-11-08 MED ORDER — METFORMIN HCL 1000 MG PO TABS: ORAL_TABLET | ORAL | Status: DC

## 2014-11-08 MED ORDER — ATORVASTATIN CALCIUM 20 MG PO TABS: ORAL_TABLET | ORAL | Status: DC

## 2014-11-08 MED ORDER — LISINOPRIL 5 MG PO TABS: ORAL_TABLET | ORAL | Status: DC

## 2014-11-08 MED ORDER — GLIMEPIRIDE 1 MG PO TABS: ORAL_TABLET | ORAL | Status: DC

## 2014-11-08 MED ORDER — OMEGA-3-ACID ETHYL ESTERS 1 G PO CAPS: 2.0000 g | ORAL_CAPSULE | Freq: Two times a day (BID) | ORAL | Status: DC

## 2014-11-17 ENCOUNTER — Telehealth: Payer: Self-pay | Admitting: Family Medicine

## 2014-11-17 NOTE — Telephone Encounter (Signed)
Pt called and said that endocrinologist  dr Forde Dandy had turned her down and they gave her 2 other drs, but she wanted know some information on them first, she couldn't remember there names, she was wanting your imput on it, said yall had been working on this for a while, pt can be reached at [phone]

## 2014-11-17 NOTE — Telephone Encounter (Signed)
Left message for pt to call me back and give the names of those 2 Doctors or Dr. Tomi Bamberger can not give input to her on the providers

## 2014-11-18 ENCOUNTER — Telehealth: Payer: Self-pay | Admitting: Family Medicine

## 2014-11-18 NOTE — Telephone Encounter (Signed)
Please see email paitient sent regarding wanting referral.  Please call patient when you return on Monday.

## 2014-11-22 NOTE — Telephone Encounter (Signed)
Spoke with patient and she has appt with Dr.Balan this Thursday. She would also like to be seen here by

## 2014-11-22 NOTE — Telephone Encounter (Signed)
Vickie for any other medical needs other than diabetes and psych.

## 2014-12-02 ENCOUNTER — Telehealth: Payer: Self-pay | Admitting: Family Medicine

## 2014-12-02 DIAGNOSIS — R112 Nausea with vomiting, unspecified: Secondary | ICD-10-CM

## 2014-12-02 DIAGNOSIS — G43009 Migraine without aura, not intractable, without status migrainosus: Secondary | ICD-10-CM

## 2014-12-02 MED ORDER — HYDROCODONE-ACETAMINOPHEN 5-300 MG PO TABS: 1.0000 | ORAL_TABLET | Freq: Four times a day (QID) | ORAL | Status: DC | PRN

## 2014-12-02 MED ORDER — FROVATRIPTAN SUCCINATE 2.5 MG PO TABS: 2.5000 mg | ORAL_TABLET | ORAL | Status: DC | PRN

## 2014-12-02 MED ORDER — PROMETHAZINE HCL 25 MG PO TABS: 25.0000 mg | ORAL_TABLET | Freq: Three times a day (TID) | ORAL | Status: DC | PRN

## 2014-12-02 NOTE — Telephone Encounter (Signed)
Patient advised.

## 2014-12-02 NOTE — Telephone Encounter (Signed)
Confirmed by email if pt was coming in for med check visit and she states she is going to Endo for her diabetes care.  So she cancelled appt.  She has requested refill on Promethazine, Frova and Hydrocodone/Acetaminophen.  She request [facility] on [address] for the pharmacy and knows she will have to pick up the one.

## 2014-12-02 NOTE — Telephone Encounter (Signed)
This was sent to me. Patient does still want to see you for care other than her diabetes, will be seeing endo Chalmers Cater) for this.

## 2014-12-02 NOTE — Telephone Encounter (Signed)
Advise pt rx's sent and written is ready

## 2014-12-03 ENCOUNTER — Other Ambulatory Visit: Payer: Self-pay | Admitting: Family Medicine

## 2014-12-07 ENCOUNTER — Encounter: Payer: BLUE CROSS/BLUE SHIELD | Admitting: Family Medicine

## 2014-12-11 ENCOUNTER — Other Ambulatory Visit: Payer: Self-pay | Admitting: Family Medicine

## 2014-12-13 ENCOUNTER — Ambulatory Visit: Payer: BLUE CROSS/BLUE SHIELD | Admitting: Family Medicine

## 2014-12-24 ENCOUNTER — Encounter: Payer: Self-pay | Admitting: Family Medicine

## 2014-12-24 ENCOUNTER — Ambulatory Visit (INDEPENDENT_AMBULATORY_CARE_PROVIDER_SITE_OTHER): Payer: BLUE CROSS/BLUE SHIELD | Admitting: Family Medicine

## 2014-12-24 VITALS — BP 122/78 | HR 76 | Wt 140.2 lb

## 2014-12-24 DIAGNOSIS — J340 Abscess, furuncle and carbuncle of nose: Secondary | ICD-10-CM | POA: Diagnosis not present

## 2014-12-24 NOTE — Progress Notes (Signed)
   Subjective:    Patient ID: Denise Kerr, female    DOB: 08/21/1970, 44 y.o.   MRN: [id number]  HPI Chief Complaint  Patient presents with  . Enzo Bi- broke out last tuesday. on nose. saw a NP last tuesday to get an overlook of her health and got a cream and antibotic for this and its not clearing up.    She is 44 year old female who is here for a sore to tip of her nose that started approximately 2 weeks ago. Complains of continued redness to tip of her nose but states she feels like the area is improving and is no longer swollen or painful. She states she is here for a second opinion. She was evaluated and treated by Sanda Klein, NP at [facility]. Mupirocin topical and oral Bactrim was prescribed. She is halfway through course of antibiotic. She reports a history of MRSA.  Denies fever, chills, nausea, vomiting, no other rash.   States her diabetes is "out of whack" and states she saw Endocrinologist Bendi Balan and had a bad experience. She has since been seeing Sanda Klein, NP at [facility] [place]. States she is taking a holistic approach for her diabetes management and patient is pleased with this.    Reviewed allergies, medications, past medical and social history.   Review of Systems Pertinent positives and negatives in the history of present illness.    Objective:   Physical Exam  Alert and oriented and in no acute distress. Erythema to external tip of nose without drainage, induration or fluctuance.       Assessment & Plan:  Cellulitis of nose, external  Discussed that she appears to be on appropriate medication and recommend that she stick with the current treatment regimen. It appears that the area is improving per patient report and she will let me know if she gets worse.

## 2015-01-02 ENCOUNTER — Other Ambulatory Visit: Payer: Self-pay | Admitting: Family Medicine

## 2015-01-17 ENCOUNTER — Other Ambulatory Visit: Payer: Self-pay | Admitting: Family Medicine

## 2015-01-24 ENCOUNTER — Telehealth: Payer: Self-pay | Admitting: Family Medicine

## 2015-01-24 ENCOUNTER — Encounter: Payer: Self-pay | Admitting: Family Medicine

## 2015-01-24 NOTE — Telephone Encounter (Signed)
Thanks for the info.  She clearly doesn't understand how a relationship with a PCP works with reference to referrals, and that the medical assistant cannot do referrals without my authorization. Agree with the plan to dismiss, as this is mutual.  Sending FYI to [place], in case she calls her

## 2015-01-24 NOTE — Telephone Encounter (Signed)
Denise Kerr called today very upset because we would not refill her test strips.  I explained that she was being seen by a endo for her diabetes.  She said she saw Dr. Bubba Camp and does not like her.  She was just seeing her for diagnostic phase.  She said Dr. Tomi Bamberger just got her nose bent out of shape.  That she never wanted her to see a specialist for her diabetes.  That Dr Tomi Bamberger told her that her only next step was for her to take insulin 3 times a day.  Pt states when she saw Dr. Bubba Camp her insulin was off the charts.  Pt is now seeing Denise Klein, NP that she is a Holistic provider and she wants pt to test 6 times per day.  Pt does not understand why we would not order her strips, these are not drugs, she is not drug seeking.  I explained that whomever is giving her the direction to test 6 times per day should be ordering her supplies.    Denise Kerr states that Denise Kerr is her health practitioner and that Dr. Tomi Bamberger keeps sticking her nose into their business.  That Dr. Tomi Bamberger has never been pro-active and has her own agenda.  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 Tomi Bamberger.  Denise Kerr does not make medical decisions.    Denise Kerr states that her HIPAA rights have been manipulated.  So I stopped her and asked her what did she mean.  She states she felt like when she and Denise Kerr discuss her referrals that it is none of Dr. Johnsie Kindred business.  She states she brought info into the front desk and ask that it be given to [place], but it was given to Dr. Tomi Bamberger instead.  She states Dr. Tomi Bamberger likes to [facility] people.  Dr. Tomi Bamberger inferred information that she did not have.  Denise Kerr had asked Denise Kerr for the referrals to Dr. Forde Dandy and Dr. Soyla Murphy and Dr Tomi Bamberger had nothing to do with it.   Dr. Tomi Bamberger has just been a barrier.  Dr. Tomi Bamberger is infuriating!  Dr. Tomi Bamberger should have never known about the referrals.  Denise Kerr did not want Dr. Tomi Bamberger in the  loop of the referrals.  Dr. Tomi Bamberger inferred info that has not been made clear to her.  I don't feel comfortable with her.  Pt states her immune system is very low.  She now has MRSA and is on an antibiotic.  Denise Klein NP said to test 6 times a day.  Dr. Tomi Bamberger has never told me to test.  I explained then that is why Dr. Tomi Bamberger would not be ordering the test strips.  I explained to Denise Kerr that she is taking this the wrong way as she was upset that Dr. Tomi Bamberger would not order the test strips.  I again explained that whomever requested that she test 6 times a day is who should order the strips.  She states that she now has the strips and it is the point that Dr. Tomi Bamberger is not a good partner.  So I explained to Denise Kerr that she has not been happy for quite a long time here and that I was going to advise Dr. Tomi Bamberger that this relationship is being dissolved today and she said she agreed.  The she said so when can I get a flu shot.  She said she just started a new job and it would  be difficult to come in.  I advised [facility], Denise Kerr and lots of pharmacies are giving them and she could get them on the weekend.  She said so when is [place] working so she can give me the shot.  I advised Mon, Wed and Thurs.  She said she was concerned about going to the pharmacies because her immune system was so low and there is a lot of coughing go around and she has been on a diet for about 6 weeks, not eating fruit, nuts or beans.  She said she would call back about the flu shot.  I have made Dr. Tomi Bamberger aware of this conversation and am sending out dismissal letter.

## 2015-01-25 ENCOUNTER — Other Ambulatory Visit: Payer: Self-pay

## 2015-01-25 ENCOUNTER — Other Ambulatory Visit: Payer: Self-pay | Admitting: Family Medicine

## 2015-01-25 DIAGNOSIS — Z1231 Encounter for screening mammogram for malignant neoplasm of breast: Secondary | ICD-10-CM

## 2015-01-25 DIAGNOSIS — R112 Nausea with vomiting, unspecified: Secondary | ICD-10-CM

## 2015-01-25 MED ORDER — NORGESTREL-ETHINYL ESTRADIOL 0.3-30 MG-MCG PO TABS: 1.0000 | ORAL_TABLET | Freq: Every day | ORAL | Status: DC

## 2015-01-25 MED ORDER — PROMETHAZINE HCL 25 MG PO TABS: 25.0000 mg | ORAL_TABLET | Freq: Three times a day (TID) | ORAL | Status: AC | PRN

## 2015-01-25 NOTE — Addendum Note (Signed)
Addended by: Minette Headland A on: 01/25/2015 03:46 PM   Modules accepted: Orders, Medications

## 2015-01-25 NOTE — Telephone Encounter (Signed)
Sent meds to pharmacy  

## 2015-01-29 ENCOUNTER — Other Ambulatory Visit: Payer: Self-pay | Admitting: Family Medicine

## 2015-01-31 ENCOUNTER — Ambulatory Visit
Admission: RE | Admit: 2015-01-31 | Discharge: 2015-01-31 | Disposition: A | Discharge status: Home / Self Care | Payer: BLUE CROSS/BLUE SHIELD | Source: Ambulatory Visit

## 2015-01-31 ENCOUNTER — Other Ambulatory Visit (INDEPENDENT_AMBULATORY_CARE_PROVIDER_SITE_OTHER): Payer: BLUE CROSS/BLUE SHIELD

## 2015-01-31 DIAGNOSIS — Z23 Encounter for immunization: Secondary | ICD-10-CM

## 2015-01-31 DIAGNOSIS — Z1231 Encounter for screening mammogram for malignant neoplasm of breast: Secondary | ICD-10-CM

## 2015-02-07 ENCOUNTER — Other Ambulatory Visit: Payer: Self-pay | Admitting: *Deleted

## 2015-02-07 ENCOUNTER — Telehealth: Payer: Self-pay | Admitting: *Deleted

## 2015-02-07 DIAGNOSIS — G43009 Migraine without aura, not intractable, without status migrainosus: Secondary | ICD-10-CM

## 2015-02-07 DIAGNOSIS — E7800 Pure hypercholesterolemia, unspecified: Secondary | ICD-10-CM

## 2015-02-07 DIAGNOSIS — E119 Type 2 diabetes mellitus without complications: Secondary | ICD-10-CM

## 2015-02-07 MED ORDER — SAXAGLIPTIN HCL 5 MG PO TABS: ORAL_TABLET | ORAL | Status: DC

## 2015-02-07 MED ORDER — METFORMIN HCL 1000 MG PO TABS: ORAL_TABLET | ORAL | Status: AC

## 2015-02-07 MED ORDER — FROVATRIPTAN SUCCINATE 2.5 MG PO TABS: 2.5000 mg | ORAL_TABLET | ORAL | Status: DC | PRN

## 2015-02-07 MED ORDER — GLIMEPIRIDE 1 MG PO TABS: ORAL_TABLET | ORAL | Status: DC

## 2015-02-07 MED ORDER — ATORVASTATIN CALCIUM 20 MG PO TABS: ORAL_TABLET | ORAL | Status: DC

## 2015-02-07 MED ORDER — LISINOPRIL 5 MG PO TABS: ORAL_TABLET | ORAL | Status: DC

## 2015-02-07 NOTE — Telephone Encounter (Signed)
Okay for the 90 day supplies of metformin, lisinopril, lipitor, glimepiride. Only 30 (zero refills) of onglyze. Okay for #10 of Frova, no ondansetron (we just refilled promethazine, doesn't need 2 nausea meds).  No on the hydrocodone (can't be called in anyway). No on Retin-A.

## 2015-02-07 NOTE — Telephone Encounter (Signed)
All meds refilled and specified by Dr.Knapp.

## 2015-02-07 NOTE — Telephone Encounter (Signed)
Patient sent Denise Kerr an email asking for a 90 day supply on the following medications: Metformin, lisinopril, glimeperide, lipitor (all to [facility]) and a 30 day with 2 refills on onglyza to [facility]. Also asking for refills on frova, ondansetron, retin A and hydrocodone-(30 day supply of these I am assuming-as they are to [facility])  She has requested to see Dr Renold Genta, they are reviewing her records and will not let he know until probably sometime next week if they will accept her as a patient. Please let me know if you are okay with the above meds and quantities and I will see that they get to where they need to go. Thanks.

## 2015-02-08 ENCOUNTER — Telehealth: Payer: Self-pay | Admitting: Family Medicine

## 2015-02-08 NOTE — Telephone Encounter (Signed)
The hydrocodone was last filled for #30 two months ago.  Looking back, this quantity has always lasted at least 3-4 months.  This is a controlled substance (that needs to be written, not called in), and if she is not completely out, should come from her new provider.   Retin-A is not an urgent medication.  This will likely require prior authorization to get, and if not completely out (and even if is, isn't an urgent medication), can come from her new provider, whose office can work on getting the authorization.  She is past due for med check and labs, and these would have been handled by me at a med check.  We will leave these other meds to her new provider, and we have given ample quantity of her meds until she can see someone else.  They will not be refilled again, so she cannot take months to find another provider. She needs medical care within the month.

## 2015-02-08 NOTE — Telephone Encounter (Signed)
Pt sent email back and wants to know why the Hydrocodone and Retin A were not refilled?

## 2015-02-08 NOTE — Telephone Encounter (Signed)
I will respond with this info to the patients email.

## 2015-02-09 ENCOUNTER — Telehealth: Payer: Self-pay | Admitting: Internal Medicine

## 2015-02-09 NOTE — Telephone Encounter (Signed)
Patient was referred here to inquire about becoming a new patient with our Practice.  Spoke with Dr. Renold Genta.  Upon reviewing patient's medical history and after speaking with the patient, the patient states that she is currently in a diagnostic phase of her thyroid.  States she has a Loss adjuster, chartered who is following her with that and she has medications for the next 3 months (she also has diabetes).  Dr. Renold Genta feels that patient would be better suited in a larger practice where there a number of physicians who are available to the patient as well as an Endocrinologist available.    Spoke with the patient in depth and offered her [facility] and provided her with their # [phone] and advised that she would have a greater selection of physicians, after hours options, specialty options as needed (Endocrinology, etc.) and they also offer different satellite offices if the [facility] didn't suit her specific demographic locale.  Patient seems happy with this opportunity and is appreciative.

## 2015-02-10 ENCOUNTER — Telehealth: Payer: Self-pay | Admitting: Family Medicine

## 2015-02-10 DIAGNOSIS — G43009 Migraine without aura, not intractable, without status migrainosus: Secondary | ICD-10-CM

## 2015-02-10 MED ORDER — HYDROCODONE-ACETAMINOPHEN 5-300 MG PO TABS: 1.0000 | ORAL_TABLET | Freq: Four times a day (QID) | ORAL | Status: DC | PRN

## 2015-02-10 NOTE — Telephone Encounter (Signed)
Pt is requesting Hydrocodone refill.  Please advise when written and I will let her know.

## 2015-02-10 NOTE — Telephone Encounter (Signed)
Please let her know that rx is ready.  Also let her know that since records from other providers who did her labs didn't send [place] the results, there is no way for me to know that she had her labs done and is UTD.  Technically, without these results, I shouldn't be refilling her meds x 90 days, but I'm giving her the benefit of the doubt.   I truly looked back at her hydrocodone fill history, and based on prior refill dates, the #30 usually lasts 3-4 months, not two.   Part of a physician-patient relationship is good communication, which clearly we do not have. She seems to think I can know things without being told or being given the information needed.  I truly hope she finds another physician with whom she can have better communication and interactions.

## 2015-02-10 NOTE — Telephone Encounter (Signed)
I have made patient aware and she will pick up rx

## 2015-02-25 ENCOUNTER — Ambulatory Visit: Payer: BLUE CROSS/BLUE SHIELD

## 2015-03-13 DIAGNOSIS — D519 Vitamin B12 deficiency anemia, unspecified: Secondary | ICD-10-CM | POA: Insufficient documentation

## 2015-05-31 DIAGNOSIS — F330 Major depressive disorder, recurrent, mild: Secondary | ICD-10-CM | POA: Diagnosis not present

## 2015-06-01 DIAGNOSIS — G4709 Other insomnia: Secondary | ICD-10-CM | POA: Diagnosis not present

## 2015-06-01 DIAGNOSIS — F3342 Major depressive disorder, recurrent, in full remission: Secondary | ICD-10-CM | POA: Diagnosis not present

## 2015-06-07 DIAGNOSIS — F330 Major depressive disorder, recurrent, mild: Secondary | ICD-10-CM | POA: Diagnosis not present

## 2015-06-14 DIAGNOSIS — F330 Major depressive disorder, recurrent, mild: Secondary | ICD-10-CM | POA: Diagnosis not present

## 2015-06-21 DIAGNOSIS — F330 Major depressive disorder, recurrent, mild: Secondary | ICD-10-CM | POA: Diagnosis not present

## 2015-06-28 DIAGNOSIS — F330 Major depressive disorder, recurrent, mild: Secondary | ICD-10-CM | POA: Diagnosis not present

## 2015-07-05 DIAGNOSIS — F330 Major depressive disorder, recurrent, mild: Secondary | ICD-10-CM | POA: Diagnosis not present

## 2015-07-12 DIAGNOSIS — F330 Major depressive disorder, recurrent, mild: Secondary | ICD-10-CM | POA: Diagnosis not present

## 2015-07-19 DIAGNOSIS — F330 Major depressive disorder, recurrent, mild: Secondary | ICD-10-CM | POA: Diagnosis not present

## 2015-07-26 DIAGNOSIS — F330 Major depressive disorder, recurrent, mild: Secondary | ICD-10-CM | POA: Diagnosis not present

## 2015-08-02 DIAGNOSIS — F330 Major depressive disorder, recurrent, mild: Secondary | ICD-10-CM | POA: Diagnosis not present

## 2015-08-09 DIAGNOSIS — F330 Major depressive disorder, recurrent, mild: Secondary | ICD-10-CM | POA: Diagnosis not present

## 2015-08-23 DIAGNOSIS — F330 Major depressive disorder, recurrent, mild: Secondary | ICD-10-CM | POA: Diagnosis not present

## 2015-08-31 DIAGNOSIS — F330 Major depressive disorder, recurrent, mild: Secondary | ICD-10-CM | POA: Diagnosis not present

## 2015-09-06 DIAGNOSIS — F330 Major depressive disorder, recurrent, mild: Secondary | ICD-10-CM | POA: Diagnosis not present

## 2015-09-13 DIAGNOSIS — R682 Dry mouth, unspecified: Secondary | ICD-10-CM | POA: Diagnosis not present

## 2015-09-13 DIAGNOSIS — E119 Type 2 diabetes mellitus without complications: Secondary | ICD-10-CM | POA: Diagnosis not present

## 2015-09-13 DIAGNOSIS — E0580 Other thyrotoxicosis without thyrotoxic crisis or storm: Secondary | ICD-10-CM | POA: Diagnosis not present

## 2015-09-13 DIAGNOSIS — E559 Vitamin D deficiency, unspecified: Secondary | ICD-10-CM | POA: Diagnosis not present

## 2015-09-13 DIAGNOSIS — K140 Glossitis: Secondary | ICD-10-CM | POA: Diagnosis not present

## 2015-09-14 DIAGNOSIS — K140 Glossitis: Secondary | ICD-10-CM | POA: Diagnosis not present

## 2015-09-20 DIAGNOSIS — F330 Major depressive disorder, recurrent, mild: Secondary | ICD-10-CM | POA: Diagnosis not present

## 2015-09-27 DIAGNOSIS — E0580 Other thyrotoxicosis without thyrotoxic crisis or storm: Secondary | ICD-10-CM | POA: Diagnosis not present

## 2015-09-27 DIAGNOSIS — F330 Major depressive disorder, recurrent, mild: Secondary | ICD-10-CM | POA: Diagnosis not present

## 2015-09-27 DIAGNOSIS — R682 Dry mouth, unspecified: Secondary | ICD-10-CM | POA: Diagnosis not present

## 2015-09-27 DIAGNOSIS — E119 Type 2 diabetes mellitus without complications: Secondary | ICD-10-CM | POA: Diagnosis not present

## 2015-09-28 DIAGNOSIS — F3342 Major depressive disorder, recurrent, in full remission: Secondary | ICD-10-CM | POA: Diagnosis not present

## 2015-09-28 DIAGNOSIS — F4322 Adjustment disorder with anxiety: Secondary | ICD-10-CM | POA: Diagnosis not present

## 2015-09-30 DIAGNOSIS — R634 Abnormal weight loss: Secondary | ICD-10-CM | POA: Diagnosis not present

## 2015-10-04 DIAGNOSIS — F330 Major depressive disorder, recurrent, mild: Secondary | ICD-10-CM | POA: Diagnosis not present

## 2015-10-05 ENCOUNTER — Encounter: Payer: BLUE CROSS/BLUE SHIELD | Attending: Internal Medicine | Admitting: *Deleted

## 2015-10-05 DIAGNOSIS — Z713 Dietary counseling and surveillance: Secondary | ICD-10-CM | POA: Diagnosis not present

## 2015-10-05 DIAGNOSIS — E119 Type 2 diabetes mellitus without complications: Secondary | ICD-10-CM

## 2015-10-05 NOTE — Patient Instructions (Signed)
Plan:  Aim for 2 Carb Choices per meal (30 grams) +/- 1 either way  Aim for 0-1 Carbs per snack if hungry  Include protein in moderation with your meals and snacks Consider reading food labels for Total Carbohydrate of foods Continue checking BG at alternate times per day      

## 2015-10-11 DIAGNOSIS — F330 Major depressive disorder, recurrent, mild: Secondary | ICD-10-CM | POA: Diagnosis not present

## 2015-10-15 NOTE — Progress Notes (Signed)
Diabetes Self-Management Education  Visit Type: First/Initial  Appt. Start Time: 1530 Appt. End Time: 1700  10/15/2015  Denise Kerr, identified by name and date of birth, is a 45 y.o. female with a diagnosis of Diabetes: Type 2.   ASSESSMENT  Height 5\' 3"  (1.6 m), weight 113 lb 14.4 oz (51.7 kg). Body mass index is 20.18 kg/m.      Diabetes Self-Management Education - 10/15/15 1148      Visit Information   Visit Type First/Initial     Initial Visit   Diabetes Type Type 2   Are you currently following a meal plan? Yes   What type of meal plan do you follow? low carb, low fat   Are you taking your medications as prescribed? Yes  taking most of them as prescribed, but is continueing to take Metformin which was DC'd by MD   Date Diagnosed 16 yeaars ago     Health Coping   How would you rate your overall health? Poor     Psychosocial Assessment   Patient Belief/Attitude about Diabetes Motivated to manage diabetes   Self-care barriers Other (comment)  extreme food restriction   Self-management support None   Other persons present Patient   Patient Concerns Nutrition/Meal planning;Glycemic Control;Problem Solving   Special Needs None   Learning Readiness Contemplating  some suspicion of professional advice, needs to build trust before commiting to any suggestions   What is the last grade level you completed in school? post baccalaureate     Pre-Education Assessment   Patient understands the diabetes disease and treatment process. Needs Instruction   Patient understands incorporating nutritional management into lifestyle. Needs Instruction   Patient undertands incorporating physical activity into lifestyle. Needs Review   Patient understands using medications safely. Needs Instruction   Patient understands monitoring blood glucose, interpreting and using results Needs Review   Patient understands prevention, detection, and treatment of acute complications. Needs  Review   Patient understands prevention, detection, and treatment of chronic complications. Demonstrates understanding / competency   Patient understands how to develop strategies to address psychosocial issues. Needs Review   Patient understands how to develop strategies to promote health/change behavior. Needs Review     Complications   Last HgB A1C per patient/outside source 6.8 %   How often do you check your blood sugar? --  varies    Number of hypoglycemic episodes per month --  not since she came off of glimepiride   Have you had a dilated eye exam in the past 12 months? Yes   Have you had a dental exam in the past 12 months? Yes   Are you checking your feet? Yes   How many days per week are you checking your feet? 4     Dietary Intake   Breakfast low to zero carb: deli meat OR protein bar OR eggs with coconut milk   Lunch vegetalbles and meat, occasionally avacado   Snack (afternoon) chicken broth OR protein bar if anything   Dinner meat and vegetables   Snack (evening) chicken broth OR protein bar if anything   Beverage(s) water     Exercise   Exercise Type ADL's     Patient Education   Previous Diabetes Education Yes (please comment)  2000 and 2005   Disease state  Definition of diabetes, type 1 and 2, and the diagnosis of diabetes  testing is being done to determine type   Nutrition management  Role of diet in the treatment of  diabetes and the relationship between the three main macronutrients and blood glucose level;Food label reading, portion sizes and measuring food.;Carbohydrate counting  value of carbohydrate for needed glucose as primary energy source as well as adequate calories   Medications Reviewed patients medication for diabetes, action, purpose, timing of dose and side effects.  also discussed insulin action and abilty to adjust doses in very small increments   Monitoring Purpose and frequency of SMBG.   Acute complications Taught treatment of hypoglycemia  - the 15 rule.   Psychosocial adjustment Worked with patient to identify barriers to care and solutions;Helped patient identify a support system for diabetes management     Individualized Goals (developed by patient)   Nutrition Follow meal plan discussed  need adequate calories to prevent further weight loss, and to allow graduate weight gain to healthier weight range.   Medications take my medication as prescribed   Monitoring  test my blood glucose as discussed     Post-Education Assessment   Patient understands the diabetes disease and treatment process. Demonstrates understanding / competency   Patient understands incorporating nutritional management into lifestyle. Demonstrates understanding / competency   Patient undertands incorporating physical activity into lifestyle. Needs Review   Patient understands using medications safely. Needs Review  depending on medications used in the future   Patient understands prevention, detection, and treatment of acute complications. Demonstrates understanding / competency   Patient understands how to develop strategies to address psychosocial issues. Needs Review   Patient understands how to develop strategies to promote health/change behavior. Needs Review     Outcomes   Expected Outcomes Demonstrated interest in learning. Expect positive outcomes   Future DMSE PRN  patient chooses to call for appt. as needed in the future   Program Status Not Completed      Individualized Plan for Diabetes Self-Management Training:   Learning Objective:  Patient will have a greater understanding of diabetes self-management. Patient education plan is to attend individual and/or group sessions per assessed needs and concerns.   Plan:   Patient Instructions  Plan:  Aim for 2 Carb Choices per meal (30 grams) +/- 1 either way  Aim for 0-1 Carbs per snack if hungry  Include protein in moderation with your meals and snacks Consider reading food labels for  Total Carbohydrate of foods Continue checking BG at alternate times per day       Expected Outcomes:  Demonstrated interest in learning. Expect positive outcomes  Education material provided: Living Well with Diabetes and Carbohydrate counting sheet, Diabetes Medication sheet, Insulin Action Sheet  If problems or questions, patient to contact team via:  Phone and Email  Future DSME appointment: PRN (patient chooses to call for appt. as needed in the future)

## 2015-10-18 DIAGNOSIS — F330 Major depressive disorder, recurrent, mild: Secondary | ICD-10-CM | POA: Diagnosis not present

## 2015-10-21 ENCOUNTER — Encounter: Payer: Self-pay | Admitting: *Deleted

## 2015-10-25 DIAGNOSIS — F330 Major depressive disorder, recurrent, mild: Secondary | ICD-10-CM | POA: Diagnosis not present

## 2015-10-26 ENCOUNTER — Encounter: Payer: Self-pay | Admitting: Internal Medicine

## 2015-11-01 DIAGNOSIS — F330 Major depressive disorder, recurrent, mild: Secondary | ICD-10-CM | POA: Diagnosis not present

## 2015-11-08 DIAGNOSIS — F330 Major depressive disorder, recurrent, mild: Secondary | ICD-10-CM | POA: Diagnosis not present

## 2015-11-15 DIAGNOSIS — F330 Major depressive disorder, recurrent, mild: Secondary | ICD-10-CM | POA: Diagnosis not present

## 2015-11-16 DIAGNOSIS — F331 Major depressive disorder, recurrent, moderate: Secondary | ICD-10-CM | POA: Diagnosis not present

## 2015-11-23 DIAGNOSIS — F330 Major depressive disorder, recurrent, mild: Secondary | ICD-10-CM | POA: Diagnosis not present

## 2015-11-29 DIAGNOSIS — F330 Major depressive disorder, recurrent, mild: Secondary | ICD-10-CM | POA: Diagnosis not present

## 2015-12-06 DIAGNOSIS — F330 Major depressive disorder, recurrent, mild: Secondary | ICD-10-CM | POA: Diagnosis not present

## 2015-12-13 DIAGNOSIS — L658 Other specified nonscarring hair loss: Secondary | ICD-10-CM | POA: Diagnosis not present

## 2015-12-13 DIAGNOSIS — R636 Underweight: Secondary | ICD-10-CM | POA: Diagnosis not present

## 2015-12-13 DIAGNOSIS — L659 Nonscarring hair loss, unspecified: Secondary | ICD-10-CM | POA: Diagnosis not present

## 2015-12-13 DIAGNOSIS — K529 Noninfective gastroenteritis and colitis, unspecified: Secondary | ICD-10-CM | POA: Diagnosis not present

## 2015-12-13 DIAGNOSIS — E038 Other specified hypothyroidism: Secondary | ICD-10-CM | POA: Diagnosis not present

## 2015-12-13 DIAGNOSIS — Z1389 Encounter for screening for other disorder: Secondary | ICD-10-CM | POA: Diagnosis not present

## 2015-12-13 DIAGNOSIS — Z23 Encounter for immunization: Secondary | ICD-10-CM | POA: Diagnosis not present

## 2015-12-13 DIAGNOSIS — E119 Type 2 diabetes mellitus without complications: Secondary | ICD-10-CM | POA: Diagnosis not present

## 2015-12-20 DIAGNOSIS — F330 Major depressive disorder, recurrent, mild: Secondary | ICD-10-CM | POA: Diagnosis not present

## 2015-12-27 DIAGNOSIS — F330 Major depressive disorder, recurrent, mild: Secondary | ICD-10-CM | POA: Diagnosis not present

## 2016-01-03 DIAGNOSIS — F330 Major depressive disorder, recurrent, mild: Secondary | ICD-10-CM | POA: Diagnosis not present

## 2016-01-10 DIAGNOSIS — F330 Major depressive disorder, recurrent, mild: Secondary | ICD-10-CM | POA: Diagnosis not present

## 2016-01-11 DIAGNOSIS — R634 Abnormal weight loss: Secondary | ICD-10-CM | POA: Diagnosis not present

## 2016-01-11 DIAGNOSIS — K529 Noninfective gastroenteritis and colitis, unspecified: Secondary | ICD-10-CM | POA: Diagnosis not present

## 2016-01-11 DIAGNOSIS — E119 Type 2 diabetes mellitus without complications: Secondary | ICD-10-CM | POA: Diagnosis not present

## 2016-01-11 DIAGNOSIS — E611 Iron deficiency: Secondary | ICD-10-CM | POA: Diagnosis not present

## 2016-01-17 DIAGNOSIS — F330 Major depressive disorder, recurrent, mild: Secondary | ICD-10-CM | POA: Diagnosis not present

## 2016-01-24 DIAGNOSIS — F330 Major depressive disorder, recurrent, mild: Secondary | ICD-10-CM | POA: Diagnosis not present

## 2016-01-26 DIAGNOSIS — Z1211 Encounter for screening for malignant neoplasm of colon: Secondary | ICD-10-CM | POA: Diagnosis not present

## 2016-01-26 DIAGNOSIS — D509 Iron deficiency anemia, unspecified: Secondary | ICD-10-CM | POA: Diagnosis not present

## 2016-01-26 DIAGNOSIS — R634 Abnormal weight loss: Secondary | ICD-10-CM | POA: Diagnosis not present

## 2016-01-26 DIAGNOSIS — R194 Change in bowel habit: Secondary | ICD-10-CM | POA: Diagnosis not present

## 2016-01-31 DIAGNOSIS — F330 Major depressive disorder, recurrent, mild: Secondary | ICD-10-CM | POA: Diagnosis not present

## 2016-02-02 ENCOUNTER — Other Ambulatory Visit: Payer: Self-pay | Admitting: Family Medicine

## 2016-02-02 DIAGNOSIS — Z1231 Encounter for screening mammogram for malignant neoplasm of breast: Secondary | ICD-10-CM

## 2016-02-03 ENCOUNTER — Ambulatory Visit
Admission: RE | Admit: 2016-02-03 | Discharge: 2016-02-03 | Disposition: A | Discharge status: Home / Self Care | Payer: BLUE CROSS/BLUE SHIELD | Source: Ambulatory Visit | Attending: Gastroenterology | Admitting: Gastroenterology

## 2016-02-03 ENCOUNTER — Other Ambulatory Visit: Payer: Self-pay | Admitting: Gastroenterology

## 2016-02-03 ENCOUNTER — Ambulatory Visit: Payer: BLUE CROSS/BLUE SHIELD

## 2016-02-03 DIAGNOSIS — R933 Abnormal findings on diagnostic imaging of other parts of digestive tract: Secondary | ICD-10-CM

## 2016-02-03 DIAGNOSIS — R634 Abnormal weight loss: Secondary | ICD-10-CM | POA: Diagnosis not present

## 2016-02-03 DIAGNOSIS — D509 Iron deficiency anemia, unspecified: Secondary | ICD-10-CM | POA: Diagnosis not present

## 2016-02-03 DIAGNOSIS — R194 Change in bowel habit: Secondary | ICD-10-CM | POA: Diagnosis not present

## 2016-02-03 DIAGNOSIS — K635 Polyp of colon: Secondary | ICD-10-CM | POA: Diagnosis not present

## 2016-02-03 DIAGNOSIS — Z1211 Encounter for screening for malignant neoplasm of colon: Secondary | ICD-10-CM | POA: Diagnosis not present

## 2016-02-03 MED ORDER — IOPAMIDOL (ISOVUE-300) INJECTION 61%
100.0000 mL | Freq: Once | INTRAVENOUS | Status: AC | PRN
  Administered 2016-02-03: 100 mL via INTRAVENOUS

## 2016-02-07 ENCOUNTER — Other Ambulatory Visit: Payer: Self-pay | Admitting: Internal Medicine

## 2016-02-07 ENCOUNTER — Ambulatory Visit
Admission: RE | Admit: 2016-02-07 | Discharge: 2016-02-07 | Disposition: A | Discharge status: Home / Self Care | Payer: BLUE CROSS/BLUE SHIELD | Source: Ambulatory Visit | Attending: Family Medicine | Admitting: Family Medicine

## 2016-02-07 DIAGNOSIS — Z1231 Encounter for screening mammogram for malignant neoplasm of breast: Secondary | ICD-10-CM | POA: Diagnosis not present

## 2016-02-07 DIAGNOSIS — F330 Major depressive disorder, recurrent, mild: Secondary | ICD-10-CM | POA: Diagnosis not present

## 2016-02-09 DIAGNOSIS — K5289 Other specified noninfective gastroenteritis and colitis: Secondary | ICD-10-CM | POA: Diagnosis not present

## 2016-02-14 DIAGNOSIS — F330 Major depressive disorder, recurrent, mild: Secondary | ICD-10-CM | POA: Diagnosis not present

## 2016-02-14 DIAGNOSIS — K529 Noninfective gastroenteritis and colitis, unspecified: Secondary | ICD-10-CM | POA: Diagnosis not present

## 2016-02-28 DIAGNOSIS — F330 Major depressive disorder, recurrent, mild: Secondary | ICD-10-CM | POA: Diagnosis not present

## 2016-02-28 DIAGNOSIS — F4320 Adjustment disorder, unspecified: Secondary | ICD-10-CM | POA: Diagnosis not present

## 2016-02-29 ENCOUNTER — Ambulatory Visit: Payer: Self-pay | Admitting: General Surgery

## 2016-02-29 DIAGNOSIS — K639 Disease of intestine, unspecified: Secondary | ICD-10-CM | POA: Diagnosis not present

## 2016-02-29 NOTE — H&P (Signed)
Denise Kerr 02/29/2016 10:16 AM Location: [facility] Patient #: [id number] DOB: Jun 28, 1970 Single / Language: Cleophus Molt / Race: White Female  History of Present Illness Odis Hollingshead MD; 02/29/2016 12:05 PM) The patient is a 46 year old female.   Note:She is referred by Dr. Collene Mares for consultation regarding a right colon mass at the appendiceal orifice that could not be removed by way of colonoscopy. She was referred to Dr. Collene Mares for evaluation of abnormal weight loss and changes in her bowel habits. She also was noted to have iron deficiency anemia. There is a history of colon cancer in her maternal grandfather. She underwent a colonoscopy which demonstrated a polypoid lesion in the right colon at the appendiceal orifice. This was felt to be a large lesion and it could not be removed completely. A biopsy demonstrated unremarkable colonic mucosa. Because the area cannot be removed completely by way of colonoscopy, she was referred here to discuss partial colectomy. She has had a number of medical issues for the past 2 years. She is somewhat frustrated that to occur this long to come to this point. She is here with a friend of hers. She did have a CT of the abdomen and pelvis which did not show any evidence of this lesion or any suspicion for metastatic type disease.  Diagnostic Studies History Nance Pear, [place]; 02/29/2016 10:16 AM) Colonoscopy within last year Mammogram within last year Pap Smear 1-5 years ago  Allergies Nance Pear, CMA; 02/29/2016 10:18 AM) Terie Purser *ANTIDEPRESSANTS* Increase liver enzymes, increase thyroid, nausea, vomiting Allergies Reconciled  Medication History Nance Pear, CMA; 02/29/2016 10:21 AM) Xanax (0.5MG  Tablet, Oral daily) Active. Lipitor (20MG  Tablet, Oral daily) Active. Wellbutrin XL (300MG  Tablet ER 24HR, Oral daily) Active. Invokana (100MG  Tablet, Oral daily) Active. Frova (2.5MG  Tablet, Oral as needed)  Active. Amaryl (1MG  Tablet, Oral daily) Active. Vicodin (5-300MG  Tablet, Oral as needed) Active. Zestril (5MG  Tablet, Oral daily) Active. MetFORMIN HCl (1000MG  Tablet, Oral twice a day) Active. Phenergan (25MG  Tablet, Oral as needed) Active. Onglyza (5MG  Tablet, Oral daily) Active. Sonata (10MG  Capsule, Oral AS needed) Active. Vitamin D3 (10000UNIT Capsule, Oral daily) Active. Probiotic (Oral daily) Active. Medications Reconciled  Social History Nance Pear, [place]; 02/29/2016 10:16 AM) Alcohol use Occasional alcohol use. Caffeine use Carbonated beverages, Tea. No drug use Tobacco use Former smoker.  Family History Nance Pear, [place]; 02/29/2016 10:16 AM) Anesthetic complications Sister. Arthritis Mother. Breast Cancer Family Members In General. Colon Polyps Mother. Depression Father, Mother. Diabetes Mellitus Father. Heart disease in female family member before age 29 Ischemic Bowel Disease Sister. Migraine Headache Mother.  Pregnancy / Birth History Nance Pear, [place]; 02/29/2016 10:16 AM) Age at menarche 30 years. Contraceptive History Oral contraceptives. Gravida 0 Irregular periods Para 0  Other Problems Nance Pear, [place]; 02/29/2016 10:16 AM) Anxiety Disorder Back Pain Diabetes Mellitus Heart murmur Hypercholesterolemia Migraine Headache     Review of Systems ([place]; 02/29/2016 10:16 AM) General Present- Appetite Loss, Fatigue and Weight Loss. Not Present- Chills, Fever, Night Sweats and Weight Gain. Skin Not Present- Change in Wart/Mole, Dryness, Hives, Jaundice, New Lesions, Non-Healing Wounds, Rash and Ulcer. HEENT Present- Wears glasses/contact lenses. Not Present- Earache, Hearing Loss, Hoarseness, Nose Bleed, Oral Ulcers, Ringing in the Ears, Seasonal Allergies, Sinus Pain, Sore Throat, Visual Disturbances and Yellow Eyes. Respiratory Not Present- Bloody sputum, Chronic Cough, Difficulty Breathing, Snoring and  Wheezing. Breast Not Present- Breast Mass, Breast Pain, Nipple Discharge and Skin Changes. Cardiovascular Not Present- Chest Pain, Difficulty Breathing Lying  Down, Leg Cramps, Palpitations, Rapid Heart Rate, Shortness of Breath and Swelling of Extremities. Gastrointestinal Present- Abdominal Pain, Chronic diarrhea and Nausea. Not Present- Bloating, Bloody Stool, Change in Bowel Habits, Constipation, Difficulty Swallowing, Excessive gas, Gets full quickly at meals, Hemorrhoids, Indigestion, Rectal Pain and Vomiting. Female Genitourinary Not Present- Frequency, Nocturia, Painful Urination, Pelvic Pain and Urgency. Musculoskeletal Present- Back Pain. Not Present- Joint Pain, Joint Stiffness, Muscle Pain, Muscle Weakness and Swelling of Extremities. Neurological Present- Decreased Memory. Not Present- Fainting, Headaches, Numbness, Seizures, Tingling, Tremor, Trouble walking and Weakness. Psychiatric Present- Anxiety. Not Present- Bipolar, Change in Sleep Pattern, Depression, Fearful and Frequent crying. Endocrine Present- Hair Changes and Hot flashes. Not Present- Cold Intolerance, Excessive Hunger, Heat Intolerance and New Diabetes. Hematology Not Present- Blood Thinners, Easy Bruising, Excessive bleeding, Gland problems, HIV and Persistent Infections.  Vitals Bary Castilla Bradford CMA; 02/29/2016 10:22 AM) 02/29/2016 10:21 AM Weight: 111.2 lb Height: 63.5in Body Surface Area: 1.52 m Body Mass Index: 19.39 kg/m  Temp.: 97.59F  Pulse: 98 (Regular)  BP: 112/64 (Sitting, Left Arm, Standard)      Physical Exam Odis Hollingshead MD; 02/29/2016 12:08 PM)  The physical exam findings are as follows: Note:General: Thin female in NAD. Pleasant and cooperative.  HEENT: [place]/AT, no external nasal or ear masses, mucous membranes are moist  EYES: EOMI, no scleral icterus, pupils normal  NECK: Supple, no obvious mass or thyroid mass/enlargement, no trachea deviation  CV: RRR, no murmur, no  edema  CHEST/RESP: Breath sounds equal and clear. Respirations nonlabored.  ABDOMEN: Soft, nontender, nondistended, no masses, no organomegaly, active bowel sounds, no scars, no hernias.  MUSCULOSKELETAL: FROM, good muscle tone, no edema  LYMPHATIC: No palpable cervical, supraclavicular adenopathy.  SKIN: No jaundice.  NEUROLOGIC: Alert and oriented, answers questions appropriately.  PSYCHIATRIC: Normal mood, affect , and behavior.    Assessment & Plan Odis Hollingshead MD; 02/29/2016 12:11 PM)  MASS OF COLON (K63.9) Impression: She felt she had a carcinoid tumor but I explained to her the pathology did not support that. I recommended a laparoscopic-assisted partial colectomy. I have explained the procedure, risks, and aftercare of colon resection. Risks include but are not limited to bleeding, infection, wound problems, anesthesia, anastomotic leak, need for colostomy, need for reoperative surgery, injury to intraabominal organs (such as intestine, spleen, kidney, bladder, ureter, etc.), ileus, irregular bowel habits. She seems to understand and would like to proceed. She states her family has a history of vasovagal reactions to general anesthesia. I asked her to mention this during her preoperative this.  Plan: Start to scheduling process. Bowel prep instructions given to her.  Jackolyn Confer, M.D.

## 2016-03-06 DIAGNOSIS — F4320 Adjustment disorder, unspecified: Secondary | ICD-10-CM | POA: Diagnosis not present

## 2016-03-06 DIAGNOSIS — F330 Major depressive disorder, recurrent, mild: Secondary | ICD-10-CM | POA: Diagnosis not present

## 2016-03-08 DIAGNOSIS — E119 Type 2 diabetes mellitus without complications: Secondary | ICD-10-CM | POA: Diagnosis not present

## 2016-03-08 DIAGNOSIS — R636 Underweight: Secondary | ICD-10-CM | POA: Diagnosis not present

## 2016-03-08 DIAGNOSIS — Z681 Body mass index (BMI) 19 or less, adult: Secondary | ICD-10-CM | POA: Diagnosis not present

## 2016-03-13 DIAGNOSIS — F4320 Adjustment disorder, unspecified: Secondary | ICD-10-CM | POA: Diagnosis not present

## 2016-03-13 DIAGNOSIS — F330 Major depressive disorder, recurrent, mild: Secondary | ICD-10-CM | POA: Diagnosis not present

## 2016-03-20 DIAGNOSIS — F330 Major depressive disorder, recurrent, mild: Secondary | ICD-10-CM | POA: Diagnosis not present

## 2016-03-20 DIAGNOSIS — F4320 Adjustment disorder, unspecified: Secondary | ICD-10-CM | POA: Diagnosis not present

## 2016-03-27 ENCOUNTER — Encounter (HOSPITAL_COMMUNITY): Payer: Self-pay | Admitting: *Deleted

## 2016-03-27 DIAGNOSIS — F3342 Major depressive disorder, recurrent, in full remission: Secondary | ICD-10-CM | POA: Diagnosis not present

## 2016-03-27 DIAGNOSIS — F330 Major depressive disorder, recurrent, mild: Secondary | ICD-10-CM | POA: Diagnosis not present

## 2016-03-27 NOTE — Patient Instructions (Addendum)
Denise Kerr  03/27/2016   Your procedure is scheduled on: 04/06/2016   Report to [facility] Main  Entrance take [place]  elevators to 3rd floor to  [place] at     [place] AM.  Call this number if you have problems the morning of surgery [phone]   Remember: ONLY 1 PERSON MAY GO WITH YOU TO SHORT STAY TO GET  READY MORNING OF Denise Kerr.  Do not eat food or drink liquids :After Midnight.     Take these medicines the morning of surgery with A SIP OF WATER: Xanax if needed, Wellbutrin, hydrocodone if needed DO NOT TAKE ANY DIABETIC MEDICATIONS DAY OF YOUR SURGERY                               You may not have any metal on your body including hair pins and              piercings  Do not wear jewelry, make-up, lotions, powders or perfumes, deodorant             Do not wear nail polish.  Do not shave  48 hours prior to surgery.               Do not bring valuables to the hospital. Denise Kerr.  Contacts, dentures or bridgework may not be worn into surgery.  Leave suitcase in the car. After surgery it may be brought to your room.                       Please read over the following fact sheets you were given: _____________________________________________________________________             [facility] - Preparing for Surgery Before surgery, you can play an important role.  Because skin is not sterile, your skin needs to be as free of germs as possible.  You can reduce the number of germs on your skin by washing with CHG (chlorahexidine gluconate) soap before surgery.  CHG is an antiseptic cleaner which kills germs and bonds with the skin to continue killing germs even after washing. Please DO NOT use if you have an allergy to CHG or antibacterial soaps.  If your skin becomes reddened/irritated stop using the CHG and inform your nurse when you arrive at Short Stay. Do not shave (including legs and  underarms) for at least 48 hours prior to the first CHG shower.  You may shave your face/neck. Please follow these instructions carefully:  1.  Shower with CHG Soap the night before surgery and the  morning of Surgery.  2.  If you choose to wash your hair, wash your hair first as usual with your  normal  shampoo.  3.  After you shampoo, rinse your hair and body thoroughly to remove the  shampoo.                           4.  Use CHG as you would any other liquid soap.  You can apply chg directly  to the skin and wash  Gently with a scrungie or clean washcloth.  5.  Apply the CHG Soap to your body ONLY FROM THE NECK DOWN.   Do not use on face/ open                           Wound or open sores. Avoid contact with eyes, ears mouth and genitals (private parts).                       Wash face,  Genitals (private parts) with your normal soap.             6.  Wash thoroughly, paying special attention to the area where your surgery  will be performed.  7.  Thoroughly rinse your body with warm water from the neck down.  8.  DO NOT shower/wash with your normal soap after using and rinsing off  the CHG Soap.                9.  Pat yourself dry with a clean towel.            10.  Wear clean pajamas.            11.  Place clean sheets on your bed the night of your first shower and do not  sleep with pets. Day of Surgery : Do not apply any lotions/deodorants the morning of surgery.  Please wear clean clothes to the hospital/surgery center.  FAILURE TO FOLLOW THESE INSTRUCTIONS MAY RESULT IN THE CANCELLATION OF YOUR SURGERY PATIENT SIGNATURE_________________________________  NURSE SIGNATURE__________________________________  ________________________________________________________________________ [facility] - Preparing for Surgery Before surgery, you can play an important role.  Because skin is not sterile, your skin needs to be as free of germs as possible.  You can reduce the  number of germs on your skin by washing with CHG (chlorahexidine gluconate) soap before surgery.  CHG is an antiseptic cleaner which kills germs and bonds with the skin to continue killing germs even after washing. Please DO NOT use if you have an allergy to CHG or antibacterial soaps.  If your skin becomes reddened/irritated stop using the CHG and inform your nurse when you arrive at Short Stay. Do not shave (including legs and underarms) for at least 48 hours prior to the first CHG shower.  You may shave your face/neck. Please follow these instructions carefully:  1.  Shower with CHG Soap the night before surgery and the  morning of Surgery.  2.  If you choose to wash your hair, wash your hair first as usual with your  normal  shampoo.  3.  After you shampoo, rinse your hair and body thoroughly to remove the  shampoo.                           4.  Use CHG as you would any other liquid soap.  You can apply chg directly  to the skin and wash                       Gently with a scrungie or clean washcloth.  5.  Apply the CHG Soap to your body ONLY FROM THE NECK DOWN.   Do not use on face/ open                           Wound or  open sores. Avoid contact with eyes, ears mouth and genitals (private parts).                       Wash face,  Genitals (private parts) with your normal soap.             6.  Wash thoroughly, paying special attention to the area where your surgery  will be performed.  7.  Thoroughly rinse your body with warm water from the neck down.  8.  DO NOT shower/wash with your normal soap after using and rinsing off  the CHG Soap.                9.  Pat yourself dry with a clean towel.            10.  Wear clean pajamas.            11.  Place clean sheets on your bed the night of your first shower and do not  sleep with pets. Day of Surgery : Do not apply any lotions/deodorants the morning of surgery.  Please wear clean clothes to the hospital/surgery center.  FAILURE TO FOLLOW  THESE INSTRUCTIONS MAY RESULT IN THE CANCELLATION OF YOUR SURGERY PATIENT SIGNATURE_________________________________  NURSE SIGNATURE__________________________________  ________________________________________________________________________  WHAT IS A BLOOD TRANSFUSION? Blood Transfusion Information  A transfusion is the replacement of blood or some of its parts. Blood is made up of multiple cells which provide different functions.  Red blood cells carry oxygen and are used for blood loss replacement.  White blood cells fight against infection.  Platelets control bleeding.  Plasma helps clot blood.  Other blood products are available for specialized needs, such as hemophilia or other clotting disorders. BEFORE THE TRANSFUSION  Who gives blood for transfusions?   Healthy volunteers who are fully evaluated to make sure their blood is safe. This is blood bank blood. Transfusion therapy is the safest it has ever been in the practice of medicine. Before blood is taken from a donor, a complete history is taken to make sure that person has no history of diseases nor engages in risky social behavior (examples are intravenous drug use or sexual activity with multiple partners). The donor's travel history is screened to minimize risk of transmitting infections, such as malaria. The donated blood is tested for signs of infectious diseases, such as HIV and hepatitis. The blood is then tested to be sure it is compatible with you in order to minimize the chance of a transfusion reaction. If you or a relative donates blood, this is often done in anticipation of surgery and is not appropriate for emergency situations. It takes many days to process the donated blood. RISKS AND COMPLICATIONS Although transfusion therapy is very safe and saves many lives, the main dangers of transfusion include:   Getting an infectious disease.  Developing a transfusion reaction. This is an allergic reaction to something  in the blood you were given. Every precaution is taken to prevent this. The decision to have a blood transfusion has been considered carefully by your caregiver before blood is given. Blood is not given unless the benefits outweigh the risks. AFTER THE TRANSFUSION  Right after receiving a blood transfusion, you will usually feel much better and more energetic. This is especially true if your red blood cells have gotten low (anemic). The transfusion raises the level of the red blood cells which carry oxygen, and this usually causes an energy increase.  The nurse  administering the transfusion will monitor you carefully for complications. HOME CARE INSTRUCTIONS  No special instructions are needed after a transfusion. You may find your energy is better. Speak with your caregiver about any limitations on activity for underlying diseases you may have. SEEK MEDICAL CARE IF:   Your condition is not improving after your transfusion.  You develop redness or irritation at the intravenous (IV) site. SEEK IMMEDIATE MEDICAL CARE IF:  Any of the following symptoms occur over the next 12 hours:  Shaking chills.  You have a temperature by mouth above 102 F (38.9 C), not controlled by medicine.  Chest, back, or muscle pain.  People around you feel you are not acting correctly or are confused.  Shortness of breath or difficulty breathing.  Dizziness and fainting.  You get a rash or develop hives.  You have a decrease in urine output.  Your urine turns a dark color or changes to pink, red, or brown. Any of the following symptoms occur over the next 10 days:  You have a temperature by mouth above 102 F (38.9 C), not controlled by medicine.  Shortness of breath.  Weakness after normal activity.  The white part of the eye turns yellow (jaundice).  You have a decrease in the amount of urine or are urinating less often.  Your urine turns a dark color or changes to pink, red, or  brown. Document Released: 02/10/2000 Document Revised: 05/07/2011 Document Reviewed: 09/29/2007 [facility] Patient Information 2014 [place], [place].  _______________________________________________________________________

## 2016-03-27 NOTE — Progress Notes (Deleted)
Clearance- Dr Marisue Humble- 02/28/16- on chart with LOV of 01/16/16 on chart  EKG on chart of 01/16/16

## 2016-03-28 ENCOUNTER — Encounter (HOSPITAL_COMMUNITY)
Admission: RE | Admit: 2016-03-28 | Discharge: 2016-03-28 | Disposition: A | Discharge status: Home / Self Care | Payer: BLUE CROSS/BLUE SHIELD | Source: Ambulatory Visit | Attending: General Surgery | Admitting: General Surgery

## 2016-03-28 ENCOUNTER — Encounter (HOSPITAL_COMMUNITY): Payer: Self-pay | Admitting: *Deleted

## 2016-03-28 DIAGNOSIS — Z01810 Encounter for preprocedural cardiovascular examination: Secondary | ICD-10-CM | POA: Diagnosis not present

## 2016-03-28 DIAGNOSIS — Z01818 Encounter for other preprocedural examination: Secondary | ICD-10-CM | POA: Diagnosis not present

## 2016-03-28 DIAGNOSIS — K6389 Other specified diseases of intestine: Secondary | ICD-10-CM | POA: Insufficient documentation

## 2016-03-28 HISTORY — DX: Nonrheumatic mitral (valve) prolapse: I34.1

## 2016-03-28 HISTORY — DX: Family history of other specified conditions: Z84.89

## 2016-03-28 HISTORY — DX: Hypothyroidism, unspecified: E03.9

## 2016-03-28 HISTORY — DX: Anemia, unspecified: D64.9

## 2016-03-28 HISTORY — DX: Anxiety disorder, unspecified: F41.9

## 2016-03-28 LAB — CBC WITH DIFFERENTIAL/PLATELET
BASOS ABS: 0 10*3/uL (ref 0.0–0.1)
BASOS PCT: 1 %
EOS PCT: 3 %
Eosinophils Absolute: 0.1 10*3/uL (ref 0.0–0.7)
HCT: 38.4 % (ref 36.0–46.0)
Hemoglobin: 12.5 g/dL (ref 12.0–15.0)
Lymphocytes Relative: 33 %
Lymphs Abs: 1.6 10*3/uL (ref 0.7–4.0)
MCH: 29 pg (ref 26.0–34.0)
MCHC: 32.6 g/dL (ref 30.0–36.0)
MCV: 89.1 fL (ref 78.0–100.0)
MONO ABS: 0.3 10*3/uL (ref 0.1–1.0)
Monocytes Relative: 6 %
NEUTROS ABS: 2.8 10*3/uL (ref 1.7–7.7)
Neutrophils Relative %: 57 %
PLATELETS: 200 10*3/uL (ref 150–400)
RBC: 4.31 MIL/uL (ref 3.87–5.11)
RDW: 13.6 % (ref 11.5–15.5)
WBC: 4.9 10*3/uL (ref 4.0–10.5)

## 2016-03-28 LAB — COMPREHENSIVE METABOLIC PANEL
ALBUMIN: 4.3 g/dL (ref 3.5–5.0)
ALT: 23 U/L (ref 14–54)
AST: 18 U/L (ref 15–41)
Alkaline Phosphatase: 83 U/L (ref 38–126)
Anion gap: 6 (ref 5–15)
BUN: 16 mg/dL (ref 6–20)
CHLORIDE: 102 mmol/L (ref 101–111)
CO2: 30 mmol/L (ref 22–32)
Calcium: 9.2 mg/dL (ref 8.9–10.3)
Creatinine, Ser: 0.69 mg/dL (ref 0.44–1.00)
GFR calc Af Amer: >60 mL/min (ref >60)
GFR calc non Af Amer: >60 mL/min (ref >60)
GLUCOSE: 146 mg/dL — AB (ref 65–99)
POTASSIUM: 4.9 mmol/L (ref 3.5–5.1)
SODIUM: 138 mmol/L (ref 135–145)
Total Bilirubin: 0.4 mg/dL (ref 0.3–1.2)
Total Protein: 7.2 g/dL (ref 6.5–8.1)

## 2016-03-28 LAB — GLUCOSE, CAPILLARY: Glucose-Capillary: 131 mg/dL — ABNORMAL HIGH (ref 65–99)

## 2016-03-28 LAB — ABO/RH: ABO/RH(D): A POS

## 2016-03-28 LAB — HCG, SERUM, QUALITATIVE: PREG SERUM: NEGATIVE

## 2016-03-28 NOTE — Progress Notes (Signed)
03/08/16- LOV- [facility] on chart along with HGBA1 C done 03/08/16- on chart

## 2016-04-03 DIAGNOSIS — F4320 Adjustment disorder, unspecified: Secondary | ICD-10-CM | POA: Diagnosis not present

## 2016-04-03 DIAGNOSIS — F330 Major depressive disorder, recurrent, mild: Secondary | ICD-10-CM | POA: Diagnosis not present

## 2016-04-05 DIAGNOSIS — F330 Major depressive disorder, recurrent, mild: Secondary | ICD-10-CM | POA: Diagnosis not present

## 2016-04-05 DIAGNOSIS — F4320 Adjustment disorder, unspecified: Secondary | ICD-10-CM | POA: Diagnosis not present

## 2016-04-06 ENCOUNTER — Inpatient Hospital Stay (HOSPITAL_COMMUNITY): Payer: BLUE CROSS/BLUE SHIELD | Admitting: Registered Nurse

## 2016-04-06 ENCOUNTER — Encounter (HOSPITAL_COMMUNITY)
Admission: RE | Disposition: A | Discharge status: Home / Self Care | Payer: Self-pay | Source: Ambulatory Visit | Attending: General Surgery

## 2016-04-06 ENCOUNTER — Encounter (HOSPITAL_COMMUNITY): Payer: Self-pay | Admitting: *Deleted

## 2016-04-06 ENCOUNTER — Inpatient Hospital Stay (HOSPITAL_COMMUNITY)
Admission: RE | Admit: 2016-04-06 | Discharge: 2016-04-09 | DRG: 331 | Disposition: A | Discharge status: Home / Self Care | Payer: BLUE CROSS/BLUE SHIELD | Source: Ambulatory Visit | Attending: General Surgery | Admitting: General Surgery

## 2016-04-06 DIAGNOSIS — Z7984 Long term (current) use of oral hypoglycemic drugs: Secondary | ICD-10-CM

## 2016-04-06 DIAGNOSIS — K6389 Other specified diseases of intestine: Secondary | ICD-10-CM | POA: Diagnosis not present

## 2016-04-06 DIAGNOSIS — K5730 Diverticulosis of large intestine without perforation or abscess without bleeding: Secondary | ICD-10-CM | POA: Diagnosis not present

## 2016-04-06 DIAGNOSIS — I10 Essential (primary) hypertension: Secondary | ICD-10-CM | POA: Diagnosis not present

## 2016-04-06 DIAGNOSIS — F419 Anxiety disorder, unspecified: Secondary | ICD-10-CM | POA: Diagnosis not present

## 2016-04-06 DIAGNOSIS — D490 Neoplasm of unspecified behavior of digestive system: Secondary | ICD-10-CM | POA: Diagnosis present

## 2016-04-06 DIAGNOSIS — E7800 Pure hypercholesterolemia, unspecified: Secondary | ICD-10-CM | POA: Diagnosis not present

## 2016-04-06 DIAGNOSIS — E119 Type 2 diabetes mellitus without complications: Secondary | ICD-10-CM | POA: Diagnosis present

## 2016-04-06 DIAGNOSIS — Z79899 Other long term (current) drug therapy: Secondary | ICD-10-CM

## 2016-04-06 DIAGNOSIS — N805 Endometriosis of intestine: Principal | ICD-10-CM | POA: Diagnosis present

## 2016-04-06 DIAGNOSIS — Z87891 Personal history of nicotine dependence: Secondary | ICD-10-CM | POA: Diagnosis not present

## 2016-04-06 DIAGNOSIS — G4700 Insomnia, unspecified: Secondary | ICD-10-CM | POA: Diagnosis not present

## 2016-04-06 DIAGNOSIS — K5710 Diverticulosis of small intestine without perforation or abscess without bleeding: Secondary | ICD-10-CM | POA: Diagnosis not present

## 2016-04-06 HISTORY — PX: LAPAROSCOPIC PARTIAL COLECTOMY: SHX5907

## 2016-04-06 LAB — GLUCOSE, CAPILLARY
GLUCOSE-CAPILLARY: 101 mg/dL — AB (ref 65–99)
GLUCOSE-CAPILLARY: 146 mg/dL — AB (ref 65–99)
GLUCOSE-CAPILLARY: 171 mg/dL — AB (ref 65–99)
Glucose-Capillary: 221 mg/dL — ABNORMAL HIGH (ref 65–99)
Glucose-Capillary: 189 mg/dL — ABNORMAL HIGH (ref 65–99)
Glucose-Capillary: 111 mg/dL — ABNORMAL HIGH (ref 65–99)

## 2016-04-06 SURGERY — LAPAROSCOPIC PARTIAL COLECTOMY
Anesthesia: General

## 2016-04-06 MED ORDER — SUGAMMADEX SODIUM 200 MG/2ML IV SOLN
INTRAVENOUS | Status: DC | PRN
  Administered 2016-04-06: 100 mg via INTRAVENOUS

## 2016-04-06 MED ORDER — ROCURONIUM BROMIDE 10 MG/ML (PF) SYRINGE
PREFILLED_SYRINGE | INTRAVENOUS | Status: DC | PRN
  Administered 2016-04-06: 40 mg via INTRAVENOUS
  Administered 2016-04-06: 10 mg via INTRAVENOUS
  Administered 2016-04-06: 20 mg via INTRAVENOUS
  Administered 2016-04-06: 10 mg via INTRAVENOUS

## 2016-04-06 MED ORDER — LACTATED RINGERS IV SOLN: INTRAVENOUS | Status: DC

## 2016-04-06 MED ORDER — DEXTROSE 5 % IV SOLN
2.0000 g | Freq: Two times a day (BID) | INTRAVENOUS | Status: AC
  Administered 2016-04-06: 2 g via INTRAVENOUS
  Filled 2016-04-06: qty 2

## 2016-04-06 MED ORDER — DEXAMETHASONE SODIUM PHOSPHATE 10 MG/ML IJ SOLN
INTRAMUSCULAR | Status: AC
  Filled 2016-04-06: qty 1

## 2016-04-06 MED ORDER — POTASSIUM CHLORIDE IN NACL 20-0.9 MEQ/L-% IV SOLN: INTRAVENOUS | Status: DC

## 2016-04-06 MED ORDER — ONDANSETRON HCL 4 MG/2ML IJ SOLN
4.0000 mg | INTRAMUSCULAR | Status: DC | PRN
  Administered 2016-04-07 (×2): 4 mg via INTRAVENOUS
  Filled 2016-04-06 (×2): qty 2

## 2016-04-06 MED ORDER — MIDAZOLAM HCL 2 MG/2ML IJ SOLN
INTRAMUSCULAR | Status: AC
  Filled 2016-04-06: qty 2

## 2016-04-06 MED ORDER — ALVIMOPAN 12 MG PO CAPS
12.0000 mg | ORAL_CAPSULE | Freq: Once | ORAL | Status: AC
  Administered 2016-04-06: 12 mg via ORAL
  Filled 2016-04-06: qty 1

## 2016-04-06 MED ORDER — ROCURONIUM BROMIDE 50 MG/5ML IV SOSY
PREFILLED_SYRINGE | INTRAVENOUS | Status: AC
  Filled 2016-04-06: qty 5

## 2016-04-06 MED ORDER — MIDAZOLAM HCL 5 MG/5ML IJ SOLN
INTRAMUSCULAR | Status: DC | PRN
  Administered 2016-04-06: 2 mg via INTRAVENOUS

## 2016-04-06 MED ORDER — SUGAMMADEX SODIUM 200 MG/2ML IV SOLN
INTRAVENOUS | Status: AC
  Filled 2016-04-06: qty 2

## 2016-04-06 MED ORDER — METOCLOPRAMIDE HCL 5 MG/ML IJ SOLN: 10.0000 mg | Freq: Once | INTRAMUSCULAR | Status: DC | PRN

## 2016-04-06 MED ORDER — MEPERIDINE HCL 50 MG/ML IJ SOLN: 6.2500 mg | INTRAMUSCULAR | Status: DC | PRN

## 2016-04-06 MED ORDER — PROPOFOL 10 MG/ML IV BOLUS
INTRAVENOUS | Status: AC
  Filled 2016-04-06: qty 20

## 2016-04-06 MED ORDER — BUPIVACAINE HCL (PF) 0.5 % IJ SOLN
INTRAMUSCULAR | Status: DC | PRN
  Administered 2016-04-06: 5 mL

## 2016-04-06 MED ORDER — MORPHINE SULFATE (PF) 10 MG/ML IV SOLN
2.0000 mg | INTRAVENOUS | Status: DC | PRN
  Administered 2016-04-06 – 2016-04-07 (×5): 2 mg via INTRAVENOUS
  Administered 2016-04-07: 4 mg via INTRAVENOUS
  Administered 2016-04-07: 2 mg via INTRAVENOUS
  Administered 2016-04-07: 4 mg via INTRAVENOUS
  Filled 2016-04-06 (×5): qty 1

## 2016-04-06 MED ORDER — CEFOTETAN DISODIUM-DEXTROSE 2-2.08 GM-% IV SOLR
2.0000 g | INTRAVENOUS | Status: AC
  Administered 2016-04-06: 2 g via INTRAVENOUS

## 2016-04-06 MED ORDER — POTASSIUM CHLORIDE IN NACL 20-0.9 MEQ/L-% IV SOLN
INTRAVENOUS | Status: AC
  Administered 2016-04-06: 1000 mL via INTRAVENOUS
  Filled 2016-04-06: qty 1000

## 2016-04-06 MED ORDER — LIP MEDEX EX OINT
TOPICAL_OINTMENT | CUTANEOUS | Status: AC
  Filled 2016-04-06: qty 7

## 2016-04-06 MED ORDER — BUPROPION HCL ER (XL) 150 MG PO TB24
150.0000 mg | ORAL_TABLET | Freq: Every day | ORAL | Status: DC
  Administered 2016-04-07 – 2016-04-09 (×3): 150 mg via ORAL
  Filled 2016-04-06 (×3): qty 1

## 2016-04-06 MED ORDER — ONDANSETRON HCL 4 MG PO TABS: 4.0000 mg | ORAL_TABLET | Freq: Four times a day (QID) | ORAL | Status: DC | PRN

## 2016-04-06 MED ORDER — LIDOCAINE 2% (20 MG/ML) 5 ML SYRINGE
INTRAMUSCULAR | Status: AC
  Filled 2016-04-06: qty 5

## 2016-04-06 MED ORDER — ALVIMOPAN 12 MG PO CAPS
12.0000 mg | ORAL_CAPSULE | Freq: Two times a day (BID) | ORAL | Status: DC
  Administered 2016-04-07 – 2016-04-08 (×3): 12 mg via ORAL
  Filled 2016-04-06 (×3): qty 1

## 2016-04-06 MED ORDER — ENOXAPARIN SODIUM 40 MG/0.4ML ~~LOC~~ SOLN
40.0000 mg | SUBCUTANEOUS | Status: DC
  Administered 2016-04-07 – 2016-04-09 (×3): 40 mg via SUBCUTANEOUS
  Filled 2016-04-06 (×3): qty 0.4

## 2016-04-06 MED ORDER — PROPOFOL 10 MG/ML IV BOLUS
INTRAVENOUS | Status: DC | PRN
  Administered 2016-04-06: 120 mg via INTRAVENOUS

## 2016-04-06 MED ORDER — MORPHINE SULFATE (PF) 10 MG/ML IV SOLN
INTRAVENOUS | Status: AC
  Administered 2016-04-06: 2 mg via INTRAVENOUS
  Filled 2016-04-06: qty 1

## 2016-04-06 MED ORDER — LIDOCAINE HCL (CARDIAC) 20 MG/ML IV SOLN
INTRAVENOUS | Status: DC | PRN
  Administered 2016-04-06: 100 mg via INTRAVENOUS

## 2016-04-06 MED ORDER — SODIUM CHLORIDE 0.9 % IV SOLN
INTRAVENOUS | Status: DC
  Administered 2016-04-06 – 2016-04-08 (×4): via INTRAVENOUS
  Filled 2016-04-06 (×8): qty 1000

## 2016-04-06 MED ORDER — LACTATED RINGERS IR SOLN
Status: DC | PRN
  Administered 2016-04-06: 1000 mL

## 2016-04-06 MED ORDER — POTASSIUM CHLORIDE IN NACL 20-0.9 MEQ/L-% IV SOLN
INTRAVENOUS | Status: DC
  Administered 2016-04-06: 1000 mL via INTRAVENOUS
  Filled 2016-04-06: qty 1000

## 2016-04-06 MED ORDER — BUPIVACAINE LIPOSOME 1.3 % IJ SUSP
20.0000 mL | Freq: Once | INTRAMUSCULAR | Status: AC
  Administered 2016-04-06: 11 mL
  Filled 2016-04-06: qty 20

## 2016-04-06 MED ORDER — INSULIN ASPART 100 UNIT/ML ~~LOC~~ SOLN
SUBCUTANEOUS | Status: AC
  Administered 2016-04-06: 5 [IU] via SUBCUTANEOUS
  Filled 2016-04-06: qty 1

## 2016-04-06 MED ORDER — BUPIVACAINE HCL (PF) 0.5 % IJ SOLN
INTRAMUSCULAR | Status: AC
  Filled 2016-04-06: qty 30

## 2016-04-06 MED ORDER — CEFOTETAN DISODIUM-DEXTROSE 2-2.08 GM-% IV SOLR
INTRAVENOUS | Status: AC
  Filled 2016-04-06: qty 50

## 2016-04-06 MED ORDER — VILAZODONE HCL 20 MG PO TABS
40.0000 mg | ORAL_TABLET | Freq: Every day | ORAL | Status: DC
  Administered 2016-04-07 – 2016-04-09 (×3): 40 mg via ORAL
  Filled 2016-04-06 (×4): qty 2

## 2016-04-06 MED ORDER — FENTANYL CITRATE (PF) 100 MCG/2ML IJ SOLN
25.0000 ug | INTRAMUSCULAR | Status: DC | PRN
  Administered 2016-04-06: 25 ug via INTRAVENOUS
  Administered 2016-04-06: 50 ug via INTRAVENOUS
  Administered 2016-04-06: 25 ug via INTRAVENOUS

## 2016-04-06 MED ORDER — FENTANYL CITRATE (PF) 100 MCG/2ML IJ SOLN
INTRAMUSCULAR | Status: AC
  Filled 2016-04-06: qty 4

## 2016-04-06 MED ORDER — LACTATED RINGERS IV SOLN
INTRAVENOUS | Status: DC | PRN
  Administered 2016-04-06 (×2): via INTRAVENOUS

## 2016-04-06 MED ORDER — FENTANYL CITRATE (PF) 100 MCG/2ML IJ SOLN
INTRAMUSCULAR | Status: AC
  Administered 2016-04-06: 25 ug via INTRAVENOUS
  Filled 2016-04-06: qty 2

## 2016-04-06 MED ORDER — POTASSIUM CHLORIDE IN NACL 20-0.9 MEQ/L-% IV SOLN
INTRAVENOUS | Status: DC
  Filled 2016-04-06 (×3): qty 1000

## 2016-04-06 MED ORDER — LISINOPRIL 5 MG PO TABS
5.0000 mg | ORAL_TABLET | Freq: Every evening | ORAL | Status: DC
  Administered 2016-04-06 – 2016-04-08 (×3): 5 mg via ORAL
  Filled 2016-04-06 (×3): qty 1

## 2016-04-06 MED ORDER — METHOCARBAMOL 1000 MG/10ML IJ SOLN
500.0000 mg | Freq: Three times a day (TID) | INTRAVENOUS | Status: DC
  Administered 2016-04-06 – 2016-04-09 (×8): 500 mg via INTRAVENOUS
  Filled 2016-04-06: qty 5
  Filled 2016-04-06 (×2): qty 550
  Filled 2016-04-06: qty 5
  Filled 2016-04-06: qty 550
  Filled 2016-04-06 (×2): qty 5
  Filled 2016-04-06 (×3): qty 550
  Filled 2016-04-06: qty 5
  Filled 2016-04-06: qty 550
  Filled 2016-04-06 (×4): qty 5

## 2016-04-06 MED ORDER — ALPRAZOLAM 0.5 MG PO TABS
0.5000 mg | ORAL_TABLET | Freq: Every day | ORAL | Status: DC | PRN
  Administered 2016-04-06: 0.5 mg via ORAL
  Filled 2016-04-06: qty 1

## 2016-04-06 MED ORDER — INSULIN ASPART 100 UNIT/ML ~~LOC~~ SOLN
0.0000 [IU] | SUBCUTANEOUS | Status: DC
  Administered 2016-04-06: 5 [IU] via SUBCUTANEOUS
  Administered 2016-04-06: 3 [IU] via SUBCUTANEOUS
  Administered 2016-04-07 – 2016-04-08 (×2): 2 [IU] via SUBCUTANEOUS
  Administered 2016-04-08: 3 [IU] via SUBCUTANEOUS
  Administered 2016-04-08: 5 [IU] via SUBCUTANEOUS
  Administered 2016-04-08: 2 [IU] via SUBCUTANEOUS

## 2016-04-06 MED ORDER — POTASSIUM CHLORIDE IN NACL 20-0.9 MEQ/L-% IV SOLN
INTRAVENOUS | Status: DC
  Filled 2016-04-06: qty 1000

## 2016-04-06 MED ORDER — DEXAMETHASONE SODIUM PHOSPHATE 10 MG/ML IJ SOLN
INTRAMUSCULAR | Status: DC | PRN
  Administered 2016-04-06: 10 mg via INTRAVENOUS

## 2016-04-06 MED ORDER — ONDANSETRON HCL 4 MG/2ML IJ SOLN
INTRAMUSCULAR | Status: AC
  Filled 2016-04-06: qty 2

## 2016-04-06 MED ORDER — BUPROPION HCL ER (XL) 300 MG PO TB24
300.0000 mg | ORAL_TABLET | Freq: Every day | ORAL | Status: DC
  Administered 2016-04-07 – 2016-04-09 (×3): 300 mg via ORAL
  Filled 2016-04-06 (×3): qty 1

## 2016-04-06 MED ORDER — FENTANYL CITRATE (PF) 100 MCG/2ML IJ SOLN
INTRAMUSCULAR | Status: DC | PRN
  Administered 2016-04-06 (×4): 50 ug via INTRAVENOUS

## 2016-04-06 MED ORDER — PANTOPRAZOLE SODIUM 40 MG IV SOLR
40.0000 mg | INTRAVENOUS | Status: DC
  Administered 2016-04-06 – 2016-04-08 (×3): 40 mg via INTRAVENOUS
  Filled 2016-04-06 (×3): qty 40

## 2016-04-06 MED ORDER — 0.9 % SODIUM CHLORIDE (POUR BTL) OPTIME
TOPICAL | Status: DC | PRN
  Administered 2016-04-06: 2000 mL

## 2016-04-06 SURGICAL SUPPLY — 83 items
APL SKNCLS STERI-STRIP NONHPOA (GAUZE/BANDAGES/DRESSINGS) ×1
APPLIER CLIP 5 13 M/L LIGAMAX5 (MISCELLANEOUS)
APPLIER CLIP ROT 10 11.4 M/L (STAPLE)
APR CLP MED LRG 11.4X10 (STAPLE)
APR CLP MED LRG 5 ANG JAW (MISCELLANEOUS)
BENZOIN TINCTURE PRP APPL 2/3 (GAUZE/BANDAGES/DRESSINGS) ×1 IMPLANT
BLADE EXTENDED COATED 6.5IN (ELECTRODE) IMPLANT
BLADE HEX COATED 2.75 (ELECTRODE) ×2 IMPLANT
CABLE HIGH FREQUENCY MONO STRZ (ELECTRODE) ×2 IMPLANT
CELLS DAT CNTRL 66122 CELL SVR (MISCELLANEOUS) IMPLANT
CLIP APPLIE 5 13 M/L LIGAMAX5 (MISCELLANEOUS) IMPLANT
CLIP APPLIE ROT 10 11.4 M/L (STAPLE) IMPLANT
COUNTER NEEDLE 20 DBL MAG RED (NEEDLE) ×2 IMPLANT
COVER MAYO STAND STRL (DRAPES) ×6 IMPLANT
COVER SURGICAL LIGHT HANDLE (MISCELLANEOUS) ×4 IMPLANT
DECANTER SPIKE VIAL GLASS SM (MISCELLANEOUS) ×1 IMPLANT
DISSECTOR BLUNT TIP ENDO 5MM (MISCELLANEOUS) IMPLANT
DRAIN CHANNEL 19F RND (DRAIN) IMPLANT
DRAPE LAPAROSCOPIC ABDOMINAL (DRAPES) ×2 IMPLANT
DRAPE SURG IRRIG POUCH 19X23 (DRAPES) ×1 IMPLANT
DRSG OPSITE POSTOP 4X10 (GAUZE/BANDAGES/DRESSINGS) IMPLANT
DRSG OPSITE POSTOP 4X6 (GAUZE/BANDAGES/DRESSINGS) IMPLANT
DRSG OPSITE POSTOP 4X8 (GAUZE/BANDAGES/DRESSINGS) IMPLANT
DRSG TEGADERM 2-3/8X2-3/4 SM (GAUZE/BANDAGES/DRESSINGS) ×1 IMPLANT
ELECT REM PT RETURN 15FT ADLT (MISCELLANEOUS) ×2 IMPLANT
ENDOLOOP SUT PDS II  0 18 (SUTURE) ×1
ENDOLOOP SUT PDS II 0 18 (SUTURE) IMPLANT
EVACUATOR SILICONE 100CC (DRAIN) IMPLANT
FILTER SMOKE EVAC LAPAROSHD (FILTER) IMPLANT
GAUZE SPONGE 2X2 8PLY STRL LF (GAUZE/BANDAGES/DRESSINGS) IMPLANT
GAUZE SPONGE 4X4 12PLY STRL (GAUZE/BANDAGES/DRESSINGS) ×1 IMPLANT
GLOVE ECLIPSE 8.0 STRL XLNG CF (GLOVE) ×4 IMPLANT
GLOVE INDICATOR 8.0 STRL GRN (GLOVE) ×4 IMPLANT
GOWN STRL REUS W/TWL XL LVL3 (GOWN DISPOSABLE) ×8 IMPLANT
HANDLE SUCTION POOLE (INSTRUMENTS) IMPLANT
HOLDER FOLEY CATH W/STRAP (MISCELLANEOUS) ×2 IMPLANT
IRRIG SUCT STRYKERFLOW 2 WTIP (MISCELLANEOUS) ×2
IRRIGATION SUCT STRKRFLW 2 WTP (MISCELLANEOUS) ×1 IMPLANT
LEGGING LITHOTOMY PAIR STRL (DRAPES) ×1 IMPLANT
LIGASURE IMPACT 36 18CM CVD LR (INSTRUMENTS) IMPLANT
PACK COLON (CUSTOM PROCEDURE TRAY) ×2 IMPLANT
PAD POSITIONING PINK XL (MISCELLANEOUS) ×2 IMPLANT
PORT LAP GEL ALEXIS MED 5-9CM (MISCELLANEOUS) ×1 IMPLANT
RELOAD PROXIMATE 75MM BLUE (ENDOMECHANICALS) ×2 IMPLANT
RELOAD STAPLE 75 3.8 BLU REG (ENDOMECHANICALS) IMPLANT
RETRACTOR WND ALEXIS 18 MED (MISCELLANEOUS) IMPLANT
RTRCTR WOUND ALEXIS 18CM MED (MISCELLANEOUS)
SCISSORS LAP 5X35 DISP (ENDOMECHANICALS) ×2 IMPLANT
SEALER TISSUE G2 STRG ARTC 35C (ENDOMECHANICALS) ×1 IMPLANT
SEALER TISSUE X1 CVD JAW (INSTRUMENTS) IMPLANT
SHEARS HARMONIC ACE PLUS 36CM (ENDOMECHANICALS) ×2 IMPLANT
SLEEVE XCEL OPT CAN 5 100 (ENDOMECHANICALS) ×6 IMPLANT
SPONGE DRAIN TRACH 4X4 STRL 2S (GAUZE/BANDAGES/DRESSINGS) IMPLANT
SPONGE GAUZE 2X2 STER 10/PKG (GAUZE/BANDAGES/DRESSINGS) ×1
STAPLER GUN LINEAR PROX 60 (STAPLE) ×1 IMPLANT
STAPLER PROXIMATE 75MM BLUE (STAPLE) ×2 IMPLANT
STAPLER VISISTAT 35W (STAPLE) ×2 IMPLANT
STRIP CLOSURE SKIN 1/2X4 (GAUZE/BANDAGES/DRESSINGS) ×1 IMPLANT
SUCTION POOLE HANDLE (INSTRUMENTS) ×2
SUT ETHILON 3 0 PS 1 (SUTURE) IMPLANT
SUT MNCRL AB 3-0 PS2 18 (SUTURE) ×1 IMPLANT
SUT MNCRL AB 4-0 PS2 18 (SUTURE) ×2 IMPLANT
SUT PDS AB 0 CT1 36 (SUTURE) ×2 IMPLANT
SUT PDS AB 1 CTX 36 (SUTURE) IMPLANT
SUT PDS AB 1 TP1 96 (SUTURE) IMPLANT
SUT PROLENE 2 0 SH DA (SUTURE) ×2 IMPLANT
SUT SILK 2 0 (SUTURE) ×2
SUT SILK 2 0 SH CR/8 (SUTURE) ×3 IMPLANT
SUT SILK 2-0 18XBRD TIE 12 (SUTURE) ×1 IMPLANT
SUT SILK 3 0 (SUTURE) ×2
SUT SILK 3 0 SH CR/8 (SUTURE) ×2 IMPLANT
SUT SILK 3-0 18XBRD TIE 12 (SUTURE) ×1 IMPLANT
SUT VICRYL 2 0 18  UND BR (SUTURE) ×1
SUT VICRYL 2 0 18 UND BR (SUTURE) ×1 IMPLANT
SYS LAPSCP GELPORT 120MM (MISCELLANEOUS)
SYSTEM LAPSCP GELPORT 120MM (MISCELLANEOUS) IMPLANT
TOWEL OR 17X26 10 PK STRL BLUE (TOWEL DISPOSABLE) ×2 IMPLANT
TOWEL OR NON WOVEN STRL DISP B (DISPOSABLE) ×2 IMPLANT
TRAY FOLEY BAG SILVER LF 14FR (CATHETERS) ×1 IMPLANT
TROCAR BLADELESS OPT 5 100 (ENDOMECHANICALS) ×2 IMPLANT
TROCAR XCEL BLUNT TIP 100MML (ENDOMECHANICALS) IMPLANT
TROCAR XCEL NON-BLD 11X100MML (ENDOMECHANICALS) IMPLANT
TUBING INSUF HEATED (TUBING) ×2 IMPLANT

## 2016-04-06 NOTE — Interval H&P Note (Signed)
History and Physical Interval Note:  04/06/2016 7:26 AM  Denise Kerr  has presented today for surgery, with the diagnosis of COLON MASS  The various methods of treatment have been discussed with the patient and family. After consideration of risks, benefits and other options for treatment, the patient has consented to  Procedure(s): LAPAROSCOPIC ASSISTED PARTIAL COLECTOMY (N/A) as a surgical intervention .  The patient's history has been reviewed, patient examined, no change in status, stable for surgery.  I have reviewed the patient's chart and labs.  Questions were answered to the patient's satisfaction.     [name] Lenna Sciara

## 2016-04-06 NOTE — Anesthesia Procedure Notes (Addendum)
Procedure Name: Intubation Date/Time: 04/06/2016 7:29 AM Performed by: Carleene Cooper A Pre-anesthesia Checklist: Patient identified, Suction available, Emergency Drugs available, Patient being monitored and Timeout performed Patient Re-evaluated:Patient Re-evaluated prior to inductionOxygen Delivery Method: Circle system utilized Preoxygenation: Pre-oxygenation with 100% oxygen Intubation Type: IV induction Ventilation: Mask ventilation without difficulty Laryngoscope Size: Miller and 2 Grade View: Grade I Tube type: Oral Tube size: 7.0 mm Number of attempts: 1 Airway Equipment and Method: Stylet Placement Confirmation: ETT inserted through vocal cords under direct vision,  positive ETCO2 and breath sounds checked- equal and bilateral Secured at: 21 cm Tube secured with: Tape Dental Injury: Teeth and Oropharynx as per pre-operative assessment  Comments: Intubated by Jackquline Bosch SRNA

## 2016-04-06 NOTE — Anesthesia Preprocedure Evaluation (Addendum)
Anesthesia Evaluation  Patient identified by MRN, date of birth, ID band Patient awake    Reviewed: Allergy & Precautions, NPO status , Patient's Chart, lab work & pertinent test results  Airway Mallampati: II  TM Distance: >3 FB Neck ROM: Full    Dental no notable dental hx.  Upper front 4 teeth veneered:   Pulmonary former smoker,    Pulmonary exam normal breath sounds clear to auscultation       Cardiovascular negative cardio ROS Normal cardiovascular exam Rhythm:Regular Rate:Normal     Neuro/Psych negative neurological ROS  negative psych ROS   GI/Hepatic negative GI ROS, Neg liver ROS,   Endo/Other  diabetes, Type 2  Renal/GU negative Renal ROS  negative genitourinary   Musculoskeletal negative musculoskeletal ROS (+)   Abdominal   Peds negative pediatric ROS (+)  Hematology negative hematology ROS (+)   Anesthesia Other Findings   Reproductive/Obstetrics negative OB ROS                            Anesthesia Physical Anesthesia Plan  ASA: II  Anesthesia Plan: General   Post-op Pain Management:    Induction: Intravenous  Airway Management Planned: Oral ETT  Additional Equipment:   Intra-op Plan:   Post-operative Plan: Extubation in OR  Informed Consent: I have reviewed the patients History and Physical, chart, labs and discussed the procedure including the risks, benefits and alternatives for the proposed anesthesia with the patient or authorized representative who has indicated his/her understanding and acceptance.   Dental advisory given  Plan Discussed with: CRNA  Anesthesia Plan Comments:         Anesthesia Quick Evaluation

## 2016-04-06 NOTE — Discharge Instructions (Addendum)
[facility], [place] [phone]  COLON SURGERY: POST OP INSTRUCTIONS  Always review your discharge instruction sheet given to you by the facility where your surgery was performed.  IF YOU HAVE DISABILITY OR FAMILY LEAVE FORMS, YOU MUST BRING THEM TO THE OFFICE FOR PROCESSING.  PLEASE DO NOT GIVE THEM TO YOUR DOCTOR.  1. A prescription for pain medication may be given to you upon discharge.  Take your pain medication as prescribed, if needed.  If narcotic pain medicine is not needed, then you may take acetaminophen (Tylenol) or ibuprofen (Advil) as needed. 2. Take your usually prescribed medications unless otherwise directed. 3. If you need a refill on your pain medication, please contact your pharmacy. They will contact our office to request authorization.  Prescriptions will not be filled after 5pm or on week-ends. 4. You should follow lean protein, lowfat diet.  Do not overeat.  Be sure to include lots of fluids daily. Most patients will experience some swelling and bruising in the area of the incision. Ice pack will help. Swelling and bruising can take several days to resolve..  5. It is common to experience some constipation if taking pain medication after surgery.  Increasing fluid intake and taking a stool softener will usually help or prevent this problem from occurring.  A mild laxative (Milk of Magnesia or Miralax) should be taken according to package directions if there are no bowel movements after 48 hours. 6.  You may have steri-strips (small skin tapes) in place directly over the incision.  These strips should be left on the skin.   Apply a dry gauze bandage over your incision.  You may shower and replace the bandage daily. 7. ACTIVITIES:  You may resume regular (light) daily activities beginning the next day--such as daily self-care, walking, climbing stairs--gradually increasing activities as tolerated.  You may have sexual intercourse when it is comfortable.  Refrain  from any heavy lifting or straining for at least 6 weeks.  Do not lift anything over 10 pounds.  a. You may drive when you no longer are taking prescription pain medication, you can comfortably wear a seatbelt, and you can safely maneuver your car and apply brakes b. Return to Work: _When released to do so by doctor.__________________________________ 8. You should see your doctor in the office for a follow-up appointment approximately 2-3 weeks after your surgery.  Make sure that you call for this appointment within a day or two after you arrive home to insure a convenient appointment time. OTHER INSTRUCTIONS:  _____________________________________________________________ _____________________________________________________________  WHEN TO CALL YOUR DOCTOR: 1. Fever over 101.0 2. Inability to urinate 3. Nausea and/or vomiting 4. Extreme swelling or bruising 5. Continued bleeding from incision. 6. Increased pain, redness, or drainage from the incision.  The clinic staff is available to answer your questions during regular business hours.  Please dont hesitate to call and ask to speak to one of the nurses if you have concerns.  For further questions, please visit [email or web address]

## 2016-04-06 NOTE — Transfer of Care (Signed)
Immediate Anesthesia Transfer of Care Note  Patient: Denise Kerr  Procedure(s) Performed: Procedure(s): LAPAROSCOPIC ASSISTED PARTIAL COLECTOMY (N/A)  Patient Location: PACU  Anesthesia Type:General  Level of Consciousness: awake, alert , oriented and patient cooperative  Airway & Oxygen Therapy: Patient Spontanous Breathing and Patient connected to face mask oxygen  Post-op Assessment: Report given to RN, Post -op Vital signs reviewed and stable and Patient moving all extremities  Post vital signs: Reviewed and stable  Last Vitals:  Vitals:   04/06/16 0553  BP: 110/76  Pulse: (!) 103  Resp: 16  Temp: 36.8 C    Last Pain:  Vitals:   04/06/16 0628  TempSrc:   PainSc: 0-No pain      Patients Stated Pain Goal: 4 ([id number])  Complications: No apparent anesthesia complications

## 2016-04-06 NOTE — Op Note (Signed)
OPERATIVE NOTE-LAPAROSCOPIC ASSISTED RIGHT COLECTOMY  Preop Dx:  Right colon neoplasm  Postop Dx:  Same  Procedure:  Laparoscopic hand assisted right colectomy  Surgeon:  Jackolyn Confer, M.D.  Asst:  Leighton Ruff, M.D.  Anesthesia:  General  EBL:  200 cc  Specimen:   Right colon and distal ileum  This is a 46 year old female with anemia. She underwent colonoscopy and was found to have a large mass in the proximal right colon near the appendiceal orifice. Biopsy was benign but mass cannot be completely removed. She now presents for the above procedure.  Procedure Detail:She was brought to the operating room placed supine on the operating table and a general anesthetic was administered. A Foley catheter was inserted. An oral gastric tube was inserted. The abdominal wall was widely sterilely prepped and draped. A timeout was performed.  She was placed in slight reverse Trendelenburg position. A 5 mm incision was made in the left subcostal area. Using a 5 mm Optiview trocar and laparoscope, access was gained to the peritoneal cavity and a pneumoperitoneum was created. Inspection of the area under the trocar demonstrated no evidence of bleeding or organ injury.  A 5 mm trocar was placed in the suprapubic region. A 5 mm trocar was placed in the left mid abdominal wall. The cecum was identified. The terminal ileum was mobile. The lateral attachments of the cecum and ascending colon were divided  and the ascending colon was medialized. I then mobilized the proximal half of the transverse colon with sharp and blunt dissection and using the Harmonic scalpel. The ileocolic artery was identified and isolated. It was then divided with the Enseal and a PDS Endoloop placed on the proximal stump. I then divided the mesentery using the Enseal of the right colon. I could easily bring the right colon and proximal dress transverse colon under the suprapubic port at this point.  A limited Pfannenstiel  incision was made for an extraction site. The 5 mm trocar in this region was removed. The fascia was divided transversely and the peritoneum divided longitudinally. A wound protection device was placed. I externalized the terminal ileum as well as the right colon and proximal half of the transverse colon. A small defect was placed in the mesentery adjacent to the ileum. The distal ileum was divided using the linear cutting stapler.  Just proximal to the middle colic vessels, a small defect was placed in the mesentery adjacent to the colon. The colon was divided here using the linear cutting stapler.  The specimen was handed off the field. The tumor was palpable.  Next, an enterotomy and colotomy were made and a side-to-side anastomosis was created using a linear cutting stapler. Staple lines were hemostatic.  The common defect was closed with a linear cutting stapler. A crotch stitch of 3-0 silk was placed at the distal anastomotic staple line. I then inverted the lateral aspects of the linear noncutting staple line with interrupted 3-0 silk sutures in a Lembert type fashion. The anastomosis was patent, viable, and under no tension. It was dropped back into the abdominal cavity. Abdominal cavity was irrigated with 1 L of irrigation.  Following this, gloves, gowns, and instruments were changed. Another liter of irrigation was used to irrigate out the abdominal cavity. There was no evidence of organ injury or bleeding. The peritoneum of the extraction site incision was approximated with running 2-0 Vicryl suture.  The fascia was approximated with running 0 PDS suture.  Repeat laparoscopy was performed and a 4  quadrant as well as central inspection was performed. There was no evidence of bleeding or organ injury. Residual irrigation fluid was evacuated. The fascial closure was solid. The pneumoperitoneum was released and the trocars were removed.  The subcutaneous tissue of the extraction site incision was  irrigated. The skin was closed with interrupted 3-0 Monocryl subcuticular stitches and Steri-Strips.. Trocar site incisions were closed with 4-0 Monocryl subcuticular stitches followed by benzoin and Steri-Strips. Sterile dressings were applied to all wounds.  She tolerated the procedure well without any apparent complications and was taken to the recovery room in satisfactory condition.

## 2016-04-06 NOTE — H&P (Signed)
JAMARRIA JUBB  DOB: 04-Oct-1970 Single / Language: English / Race: White Female  History of Present Illness  The patient is a 46 year old female.   Note:She was referred by Dr. Collene Mares for consultation regarding a right colon mass at the appendiceal orifice that could not be removed by way of colonoscopy. She was referred to Dr. Collene Mares for evaluation of abnormal weight loss and changes in her bowel habits. She also was noted to have iron deficiency anemia. There is a history of colon cancer in her maternal grandfather. She underwent a colonoscopy which demonstrated a polypoid lesion in the right colon at the appendiceal orifice. This was felt to be a large lesion and it could not be removed completely. A biopsy demonstrated unremarkable colonic mucosa. Because the area cannot be removed completely by way of colonoscopy, she was referred here to discuss partial colectomy. She has had a number of medical issues for the past 2 years.  She did have a CT of the abdomen and pelvis which did not show any evidence of this lesion or any suspicion for metastatic type disease.  PMH:  DM, hypercholesterolemia, migraine headaches, Anxiety   Allergies  Fetzima *ANTIDEPRESSANTS* Increase liver enzymes, increase thyroid, nausea, vomiting Allergies Reconciled  Prior to Admission medications   Medication Sig Start Date End Date Taking? Authorizing Provider  ALPRAZolam Duanne Moron) 0.5 MG tablet Take 0.5 mg by mouth daily as needed for anxiety or sleep.    Yes Historical Provider, MD  atorvastatin (LIPITOR) 10 MG tablet Take 10 mg by mouth daily at 6 PM. Takes with lisinopril   Yes Historical Provider, MD  buPROPion (WELLBUTRIN XL) 150 MG 24 hr tablet Take 150 mg by mouth daily. Takes along with 300 mg to equal 450 mg  daily   Yes Historical Provider, MD  buPROPion (WELLBUTRIN XL) 300 MG 24 hr tablet Take 300 mg by mouth daily. Takes along with 150 mg to equal 450 mg daily   Yes Historical Provider, MD   Cholecalciferol (MAXIMUM D3) 10000 units CAPS Take 10,000 Units by mouth daily.    Yes Historical Provider, MD  frovatriptan (FROVA) 2.5 MG tablet Take 1 tablet (2.5 mg total) by mouth as needed for migraine. If recurs, may repeat after 2 hours. Max of 3 tabs in 24 hours. 02/07/15 03/27/16 Yes Rita Ohara, MD  Hydrocodone-Acetaminophen (VICODIN) 5-300 MG TABS Take 1-2 tablets by mouth every 6 (six) hours as needed (pain, headache). Patient taking differently: Take 1-2 tablets by mouth every 6 (six) hours as needed (pain, headache). Depends on pain level if takes 1-2 tablets 02/10/15  Yes Rita Ohara, MD  iron polysaccharides (NIFEREX) 150 MG capsule Take 150 mg by mouth 2 (two) times daily.   Yes Historical Provider, MD  linagliptin (TRADJENTA) 5 MG TABS tablet Take 5 mg by mouth daily.   Yes Historical Provider, MD  lisinopril (PRINIVIL,ZESTRIL) 5 MG tablet TAKE 1 TABLET DAILY Patient taking differently: Take 5 mg by mouth every evening.  02/07/15  Yes Rita Ohara, MD  metFORMIN (GLUCOPHAGE) 1000 MG tablet TAKE 1 TABLET TWICE A DAY WITH MEALS 02/07/15  Yes Rita Ohara, MD  NATURE-THROID 65 MG tablet take 1/4 tablet daily 01/11/16  Yes Historical Provider, MD  promethazine (PHENERGAN) 25 MG tablet Take 1 tablet (25 mg total) by mouth every 8 (eight) hours as needed for nausea or vomiting. 01/25/15  Yes Rita Ohara, MD  tretinoin (RETIN-A) 0.025 % cream APPLY 1 APPLICATION TOPICALLY EVERY NIGHT AT BEDTIME Patient taking differently:  Apply 1 application topically 2 (two) times daily.  05/26/14  Yes Rita Ohara, MD  Vilazodone HCl (VIIBRYD) 40 MG TABS Take 40 mg by mouth daily.   Yes Historical Provider, MD  atorvastatin (LIPITOR) 20 MG tablet TAKE 1 TABLET DAILY Patient not taking: Reported on 03/27/2016 02/07/15   Rita Ohara, MD  canagliflozin (INVOKANA) 100 MG TABS tablet Take 1 tablet (100 mg total) by mouth daily. BEFORE first meal of the day Patient not taking: Reported on 10/15/2015 09/06/14   Rita Ohara, MD   glimepiride (AMARYL) 1 MG tablet TAKE ONE-HALF (1/2) TABLET (0.5 MG TOTAL) TWICE A DAY Patient not taking: Reported on 10/15/2015 02/07/15   Rita Ohara, MD  glucose blood test strip Use as instructed. ONE TOUCH ULTRA TEST STRIPS Patient taking differently: Test 2-3 times daily ONE TOUCH ULTRA TEST STRIPS 12/02/13   Rita Ohara, MD  L-Methylfolate (DEPLIN) 15 MG TABS Take 1 tablet by mouth daily.    Historical Provider, MD  metroNIDAZOLE (FLAGYL) 500 MG tablet Take 500 mg by mouth See admin instructions. Will take prior to procedure 02/29/16   Historical Provider, MD  neomycin (MYCIFRADIN) 500 MG tablet Will take prior to procedure 02/29/16   Historical Provider, MD  norgestrel-ethinyl estradiol (CRYSELLE-28) 0.3-30 MG-MCG tablet Take 1 tablet by mouth daily. Patient not taking: Reported on 03/27/2016 01/25/15   Rita Ohara, MD  omega-3 acid ethyl esters (LOVAZA) 1 G capsule TAKE 2 CAPSULES BY MOUTH TWICE DAILY Patient not taking: Reported on 10/15/2015 01/31/15   Rita Ohara, MD  ondansetron (ZOFRAN ODT) 4 MG disintegrating tablet Take 1 tablet (4 mg total) by mouth every 8 (eight) hours as needed for nausea or vomiting. Patient not taking: Reported on 03/27/2016 08/03/13   Rita Ohara, MD  saxagliptin HCl (ONGLYZA) 5 MG TABS tablet TAKE 1 TABLET(5 MG) BY MOUTH DAILY Patient not taking: Reported on 10/15/2015 02/07/15   Rita Ohara, MD  Suvorexant (BELSOMRA) 20 MG TABS Take 20 mg by mouth at bedtime as needed (sleep).    Historical Provider, MD    Social History Alcohol use Occasional alcohol use. Caffeine use Carbonated beverages, Tea. No drug use Tobacco use Former smoker.  Family History  Anesthetic complications Sister. Arthritis Mother. Breast Cancer Family Members In General. Colon Polyps Mother. Depression Father, Mother. Diabetes Mellitus Father. Heart disease in female family member before age 36 Ischemic Bowel Disease Sister. Migraine Headache Mother.  Other Problems  Anxiety  Disorder Back Pain Diabetes Mellitus Heart murmur Hypercholesterolemia Migraine Headache   Physical Exam   The physical exam findings are as follows: Note:General: Thin female in NAD. Pleasant and cooperative.  HEENT: [place]/AT, no external nasal or ear masses, mucous membranes are moist  EYES: EOMI, no scleral icterus, pupils normal  NECK: Supple, no obvious mass or thyroid mass/enlargement, no trachea deviation  CV: RRR, no murmur, no edema  CHEST/RESP: Breath sounds equal and clear. Respirations nonlabored.  ABDOMEN: Soft, nontender, nondistended, no masses, no organomegaly, active bowel sounds, no scars, no hernias.  MUSCULOSKELETAL: FROM, good muscle tone, no edema  LYMPHATIC: No palpable cervical, supraclavicular adenopathy.  SKIN: No jaundice.  NEUROLOGIC: Alert and oriented, answers questions appropriately.  PSYCHIATRIC: Normal mood, affect , and behavior.    Assessment & Plan Odis Hollingshead MD; 02/29/2016 12:11 PM)  MASS OF COLON (K63.9) Impression: She felt she had a carcinoid tumor but I explained to her the pathology did not support that. I recommended a laparoscopic-assisted partial colectomy. I have explained the procedure, risks, and aftercare  of colon resection. Risks include but are not limited to bleeding, infection, wound problems, anesthesia, anastomotic leak, need for colostomy, need for reoperative surgery, injury to intraabominal organs (such as intestine, spleen, kidney, bladder, ureter, etc.), ileus, irregular bowel habits. She seems to understand and would like to proceed. She states her family has a history of vasovagal reactions to general anesthesia. I asked her to mention this during her preoperative this.   Jackolyn Confer, M.D.

## 2016-04-06 NOTE — Anesthesia Postprocedure Evaluation (Signed)
Anesthesia Post Note  Patient: Denise Kerr  Procedure(s) Performed: Procedure(s) (LRB): LAPAROSCOPIC ASSISTED PARTIAL COLECTOMY (N/A)  Patient location during evaluation: PACU Anesthesia Type: General Level of consciousness: awake and alert Pain management: pain level controlled Vital Signs Assessment: post-procedure vital signs reviewed and stable Respiratory status: spontaneous breathing, nonlabored ventilation, respiratory function stable and patient connected to nasal cannula oxygen Cardiovascular status: blood pressure returned to baseline and stable Postop Assessment: no signs of nausea or vomiting Anesthetic complications: no       Last Vitals:  Vitals:   04/06/16 1029 04/06/16 1030  BP: (!) 114/95 123/68  Pulse: (!) 103 (!) 102  Resp: 16 15  Temp: 37.1 C     Last Pain:  Vitals:   04/06/16 1029  TempSrc:   PainSc: 0-No pain                 Montez Hageman

## 2016-04-07 LAB — CBC
HEMATOCRIT: 35.3 % — AB (ref 36.0–46.0)
HEMOGLOBIN: 11.5 g/dL — AB (ref 12.0–15.0)
MCH: 29 pg (ref 26.0–34.0)
MCHC: 32.6 g/dL (ref 30.0–36.0)
MCV: 89.1 fL (ref 78.0–100.0)
Platelets: 183 10*3/uL (ref 150–400)
RBC: 3.96 MIL/uL (ref 3.87–5.11)
RDW: 13.8 % (ref 11.5–15.5)
WBC: 14.2 10*3/uL — AB (ref 4.0–10.5)

## 2016-04-07 LAB — BASIC METABOLIC PANEL
ANION GAP: 8 (ref 5–15)
BUN: 10 mg/dL (ref 6–20)
CO2: 27 mmol/L (ref 22–32)
Calcium: 8.8 mg/dL — ABNORMAL LOW (ref 8.9–10.3)
Chloride: 104 mmol/L (ref 101–111)
Creatinine, Ser: 0.76 mg/dL (ref 0.44–1.00)
Glucose, Bld: 134 mg/dL — ABNORMAL HIGH (ref 65–99)
POTASSIUM: 4.7 mmol/L (ref 3.5–5.1)
SODIUM: 139 mmol/L (ref 135–145)

## 2016-04-07 LAB — GLUCOSE, CAPILLARY
GLUCOSE-CAPILLARY: 95 mg/dL (ref 65–99)
GLUCOSE-CAPILLARY: 120 mg/dL — AB (ref 65–99)
GLUCOSE-CAPILLARY: 104 mg/dL — AB (ref 65–99)
GLUCOSE-CAPILLARY: 106 mg/dL — AB (ref 65–99)
Glucose-Capillary: 89 mg/dL (ref 65–99)
Glucose-Capillary: 138 mg/dL — ABNORMAL HIGH (ref 65–99)

## 2016-04-07 MED ORDER — LORAZEPAM 1 MG PO TABS
1.0000 mg | ORAL_TABLET | Freq: Every evening | ORAL | Status: DC | PRN
  Administered 2016-04-07 – 2016-04-08 (×2): 1 mg via ORAL
  Filled 2016-04-07 (×2): qty 1

## 2016-04-07 NOTE — Progress Notes (Signed)
  General Surgery [facility]  Assessment & Plan:  POD#1 status post lap-assisted right colectomy  Begin clear liquid diet  OOB, ambulate  Ativan for sleep  IVF  On Entereg        Denise Regal, MD, [facility]       Office: [phone]    Subjective: Patient complains of inability to sleep last night.  Denies nausea or emesis.  Objective: Vital signs in last 24 hours: Temp:  [98 F (36.7 C)-99 F (37.2 C)] 98.6 F (37 C) (02/10 0500) Pulse Rate:  [84-105] 98 (02/10 0500) Resp:  [11-22] 18 (02/10 0500) BP: (112-134)/(65-95) 128/65 (02/10 0500) SpO2:  [97 %-100 %] 97 % (02/10 0500)    Intake/Output from previous day: 02/09 0701 - 02/10 0700 In: 2633.3 [P.O.:30; I.V.:2438.3; IV Piggyback:165] Out: 1905 [Urine:1805; Blood:100] Intake/Output this shift: No intake/output data recorded.  Physical Exam: HEENT - sclerae clear, mucous membranes moist Neck - soft Abdomen - soft, no distension; few BS present; wounds dry and intact Ext - no edema, non-tender Neuro - alert & oriented, no focal deficits  Lab Results:   Recent Labs  04/07/16 0509  WBC 14.2*  HGB 11.5*  HCT 35.3*  PLT 183   BMET  Recent Labs  04/07/16 0509  NA 139  K 4.7  CL 104  CO2 27  GLUCOSE 134*  BUN 10  CREATININE 0.76  CALCIUM 8.8*   PT/INR No results for input(s): LABPROT, INR in the last 72 hours. Comprehensive Metabolic Panel:    Component Value Date/Time   NA 139 04/07/2016 0509   NA 138 03/28/2016 1048   K 4.7 04/07/2016 0509   K 4.9 03/28/2016 1048   CL 104 04/07/2016 0509   CL 102 03/28/2016 1048   CO2 27 04/07/2016 0509   CO2 30 03/28/2016 1048   BUN 10 04/07/2016 0509   BUN 16 03/28/2016 1048   CREATININE 0.76 04/07/2016 0509   CREATININE 0.69 03/28/2016 1048   CREATININE 0.78 05/26/2014 0001   CREATININE 0.71 11/17/2013 0927   GLUCOSE 134 (H) 04/07/2016 0509   GLUCOSE 146 (H) 03/28/2016 1048   CALCIUM 8.8  (L) 04/07/2016 0509   CALCIUM 9.2 03/28/2016 1048   AST 18 03/28/2016 1048   AST 21 05/26/2014 0001   ALT 23 03/28/2016 1048   ALT 24 05/26/2014 0001   ALKPHOS 83 03/28/2016 1048   ALKPHOS 57 05/26/2014 0001   BILITOT 0.4 03/28/2016 1048   BILITOT 0.5 05/26/2014 0001   PROT 7.2 03/28/2016 1048   PROT 6.7 05/26/2014 0001   ALBUMIN 4.3 03/28/2016 1048   ALBUMIN 4.0 05/26/2014 0001    Studies/Results: No results found.    Denise Kerr 04/07/2016  Patient ID: Denise Kerr, female   DOB: 08-15-1970, 46 y.o.   MRN: [id number]

## 2016-04-08 LAB — GLUCOSE, CAPILLARY
GLUCOSE-CAPILLARY: 131 mg/dL — AB (ref 65–99)
GLUCOSE-CAPILLARY: 200 mg/dL — AB (ref 65–99)
GLUCOSE-CAPILLARY: 206 mg/dL — AB (ref 65–99)
GLUCOSE-CAPILLARY: 99 mg/dL (ref 65–99)
Glucose-Capillary: 129 mg/dL — ABNORMAL HIGH (ref 65–99)
Glucose-Capillary: 212 mg/dL — ABNORMAL HIGH (ref 65–99)

## 2016-04-08 MED ORDER — INSULIN ASPART 100 UNIT/ML ~~LOC~~ SOLN
0.0000 [IU] | Freq: Three times a day (TID) | SUBCUTANEOUS | Status: DC
Start: 2016-04-09 — End: 2016-04-09
  Administered 2016-04-09: 3 [IU] via SUBCUTANEOUS

## 2016-04-08 MED ORDER — HYDROCODONE-ACETAMINOPHEN 5-325 MG PO TABS
1.0000 | ORAL_TABLET | ORAL | Status: DC | PRN
  Administered 2016-04-08: 1 via ORAL
  Filled 2016-04-08: qty 1

## 2016-04-08 NOTE — Progress Notes (Signed)
Pharmacy Brief Note - Alvimopan (Entereg)  The standing order set for alvimopan (Entereg) now includes an automatic order to discontinue the drug after the patient has had a bowel movement.  The change was approved by the [place] and the Medical Executive Committee.    This patient has had a bowel movement documented by nursing.  RN also called pharmacy to inform that the patient has had a large BM this afternoon.  Therefore, alvimopan has been discontinued.  If there are questions, please contact the pharmacy at [phone].  Thank you  Gretta Arab PharmD, BCPS Pager [phone] 04/08/2016 5:03 PM

## 2016-04-08 NOTE — Progress Notes (Signed)
  General Surgery [facility]  Assessment & Plan:  POD#2 status post lap-assisted right colectomy             Advance to carb modified diet this AM             OOB, ambulate             Decrease IVF rate             Continue [place], MD, [facility]       Office: [phone]    Subjective: Patient appears comfortable.  Tolerated clear liquid diet.  Ambulating. Foley out this AM.  Objective: Vital signs in last 24 hours: Temp:  [98.5 F (36.9 C)-99.3 F (37.4 C)] 98.9 F (37.2 C) (02/11 0533) Pulse Rate:  [83-103] 96 (02/11 0533) Resp:  [16] 16 (02/11 0533) BP: (110-134)/(67-85) 110/77 (02/11 0533) SpO2:  [98 %-100 %] 100 % (02/11 0533)    Intake/Output from previous day: 02/10 0701 - 02/11 0700 In: 2250 [P.O.:360; I.V.:1890] Out: 2850 [Urine:2850] Intake/Output this shift: No intake/output data recorded.  Physical Exam: HEENT - sclerae clear, mucous membranes moist Abdomen - soft, minimal distension; BS present; wounds dry and intact Ext - no edema, non-tender Neuro - alert & oriented, no focal deficits  Lab Results:   Recent Labs  04/07/16 0509  WBC 14.2*  HGB 11.5*  HCT 35.3*  PLT 183   BMET  Recent Labs  04/07/16 0509  NA 139  K 4.7  CL 104  CO2 27  GLUCOSE 134*  BUN 10  CREATININE 0.76  CALCIUM 8.8*   PT/INR No results for input(s): LABPROT, INR in the last 72 hours. Comprehensive Metabolic Panel:    Component Value Date/Time   NA 139 04/07/2016 0509   NA 138 03/28/2016 1048   K 4.7 04/07/2016 0509   K 4.9 03/28/2016 1048   CL 104 04/07/2016 0509   CL 102 03/28/2016 1048   CO2 27 04/07/2016 0509   CO2 30 03/28/2016 1048   BUN 10 04/07/2016 0509   BUN 16 03/28/2016 1048   CREATININE 0.76 04/07/2016 0509   CREATININE 0.69 03/28/2016 1048   CREATININE 0.78 05/26/2014 0001   CREATININE 0.71 11/17/2013 0927   GLUCOSE 134 (H) 04/07/2016 0509   GLUCOSE 146  (H) 03/28/2016 1048   CALCIUM 8.8 (L) 04/07/2016 0509   CALCIUM 9.2 03/28/2016 1048   AST 18 03/28/2016 1048   AST 21 05/26/2014 0001   ALT 23 03/28/2016 1048   ALT 24 05/26/2014 0001   ALKPHOS 83 03/28/2016 1048   ALKPHOS 57 05/26/2014 0001   BILITOT 0.4 03/28/2016 1048   BILITOT 0.5 05/26/2014 0001   PROT 7.2 03/28/2016 1048   PROT 6.7 05/26/2014 0001   ALBUMIN 4.3 03/28/2016 1048   ALBUMIN 4.0 05/26/2014 0001    Studies/Results: No results found.    Janayah Zavada M 04/08/2016  Patient ID: [name], female   DOB: 07-15-1970, 46 y.o.   MRN: [id number]

## 2016-04-08 NOTE — Progress Notes (Signed)
Pt reports MD promised to order PO pain meds. Order not placed. Dr. Harlow Asa made aware. New order given for norco 5/325 mg. Madelin Headings.

## 2016-04-09 ENCOUNTER — Encounter (HOSPITAL_COMMUNITY): Payer: Self-pay | Admitting: General Surgery

## 2016-04-09 LAB — GLUCOSE, CAPILLARY: GLUCOSE-CAPILLARY: 166 mg/dL — AB (ref 65–99)

## 2016-04-09 LAB — TYPE AND SCREEN
ABO/RH(D): A POS
Antibody Screen: NEGATIVE

## 2016-04-09 MED ORDER — HYDROCODONE-ACETAMINOPHEN 5-325 MG PO TABS: 1.0000 | ORAL_TABLET | ORAL | 0 refills | Status: DC | PRN

## 2016-04-09 NOTE — Progress Notes (Signed)
Assessment Principal Problem:   Colon neoplasm s/p laparoscopic assisted right hemicolectomy 04/06/16-progressing well; pathology pending. Active Problems:   Type 2 diabetes mellitus (HCC)-cbgs 129-212      Plan:  Discharge today.  Instructions given to her.   LOS: 3 days     3 Days Post-Op  Subjective: Feels good.  Tolerating diet.  Slept well last night.  Ambulating independently.  Objective: Vital signs in last 24 hours: Temp:  [98 F (36.7 C)-98.6 F (37 C)] 98.2 F (36.8 C) (02/12 0528) Pulse Rate:  [89-127] 102 (02/12 0528) Resp:  [16] 16 (02/11 1333) BP: (92-119)/(58-80) 92/58 (02/12 0528) SpO2:  [98 %-100 %] 99 % (02/12 0528) Last BM Date: 04/08/16  Intake/Output from previous day: 02/11 0701 - 02/12 0700 In: 1992.9 [P.O.:720; I.V.:1272.9] Out: 700 [Urine:700] Intake/Output this shift: No intake/output data recorded.  PE: General- In NAD Abdomen-soft, active bowel sounds, incisions are clean and intact  Lab Results:   Recent Labs  04/07/16 0509  WBC 14.2*  HGB 11.5*  HCT 35.3*  PLT 183   BMET  Recent Labs  04/07/16 0509  NA 139  K 4.7  CL 104  CO2 27  GLUCOSE 134*  BUN 10  CREATININE 0.76  CALCIUM 8.8*   PT/INR No results for input(s): LABPROT, INR in the last 72 hours. Comprehensive Metabolic Panel:    Component Value Date/Time   NA 139 04/07/2016 0509   NA 138 03/28/2016 1048   K 4.7 04/07/2016 0509   K 4.9 03/28/2016 1048   CL 104 04/07/2016 0509   CL 102 03/28/2016 1048   CO2 27 04/07/2016 0509   CO2 30 03/28/2016 1048   BUN 10 04/07/2016 0509   BUN 16 03/28/2016 1048   CREATININE 0.76 04/07/2016 0509   CREATININE 0.69 03/28/2016 1048   CREATININE 0.78 05/26/2014 0001   CREATININE 0.71 11/17/2013 0927   GLUCOSE 134 (H) 04/07/2016 0509   GLUCOSE 146 (H) 03/28/2016 1048   CALCIUM 8.8 (L) 04/07/2016 0509   CALCIUM 9.2 03/28/2016 1048   AST 18 03/28/2016 1048   AST 21 05/26/2014 0001   ALT 23 03/28/2016 1048   ALT 24  05/26/2014 0001   ALKPHOS 83 03/28/2016 1048   ALKPHOS 57 05/26/2014 0001   BILITOT 0.4 03/28/2016 1048   BILITOT 0.5 05/26/2014 0001   PROT 7.2 03/28/2016 1048   PROT 6.7 05/26/2014 0001   ALBUMIN 4.3 03/28/2016 1048   ALBUMIN 4.0 05/26/2014 0001     Studies/Results: No results found.  Anti-infectives: Anti-infectives    Start     Dose/Rate Route Frequency Ordered Stop   04/06/16 2000  cefoTEtan (CEFOTAN) 2 g in dextrose 5 % 50 mL IVPB     2 g 100 mL/hr over 30 Minutes Intravenous Every 12 hours 04/06/16 1440 04/06/16 2047   04/06/16 0604  cefoTEtan in Dextrose 5% (CEFOTAN) IVPB 2 g     2 g Intravenous On call to O.R. 04/06/16 0604 04/06/16 0745       Lloyd Ayo J 04/09/2016

## 2016-04-11 NOTE — Discharge Summary (Signed)
Physician Discharge Summary  Patient ID: Denise Kerr MRN: [id number] DOB/AGE: [birth year] 46 y.o.  Admit date: 04/06/2016 Discharge date: 04/09/2016  Admission Diagnoses:  Endometrioma of cecum with submucosal hemorrhage  Discharge Diagnoses:  Principal Problem:   Colon neoplasm s/p laparoscopic assisted right hemicolectomy 04/06/16 Active Problems:   Type 2 diabetes mellitus ([place])   Discharged Condition: good  Hospital Course: He underwent the above procedure and tolerated it well.  She advanced rapidly on the postop colorectal surgery pathway and was able to be discharged on POD #3.  Discharge instructions were given to her.   Discharge Exam: Blood pressure (!) 92/58, pulse (!) 102, temperature 98.2 F (36.8 C), temperature source Oral, resp. rate 16, height 5' 3.25" (1.607 m), weight 50.8 kg (112 lb), last menstrual period 01/11/2016, SpO2 99 %.   Disposition: 01-Home or Self Care   Allergies as of 04/09/2016      Reactions   Fetzima [levomilnacipran]    Elevated liver enzymes per MD notes      Medication List    STOP taking these medications   Hydrocodone-Acetaminophen 5-300 MG Tabs Commonly known as:  VICODIN Replaced by:  HYDROcodone-acetaminophen 5-325 MG tablet     TAKE these medications   ALPRAZolam 0.5 MG tablet Commonly known as:  XANAX Take 0.5 mg by mouth daily as needed for anxiety or sleep.   atorvastatin 10 MG tablet Commonly known as:  LIPITOR Take 10 mg by mouth daily at 6 PM. Takes with lisinopril   atorvastatin 20 MG tablet Commonly known as:  LIPITOR TAKE 1 TABLET DAILY   BELSOMRA 20 MG Tabs Generic drug:  Suvorexant Take 20 mg by mouth at bedtime as needed (sleep).   buPROPion 300 MG 24 hr tablet Commonly known as:  WELLBUTRIN XL Take 300 mg by mouth daily. Takes along with 150 mg to equal 450 mg daily   buPROPion 150 MG 24 hr tablet Commonly known as:  WELLBUTRIN XL Take 150 mg by mouth daily. Takes along with 300 mg to equal 450  mg  daily   canagliflozin 100 MG Tabs tablet Commonly known as:  INVOKANA Take 1 tablet (100 mg total) by mouth daily. BEFORE first meal of the day   DEPLIN 15 MG Tabs Take 1 tablet by mouth daily.   frovatriptan 2.5 MG tablet Commonly known as:  FROVA Take 1 tablet (2.5 mg total) by mouth as needed for migraine. If recurs, may repeat after 2 hours. Max of 3 tabs in 24 hours.   glimepiride 1 MG tablet Commonly known as:  AMARYL TAKE ONE-HALF (1/2) TABLET (0.5 MG TOTAL) TWICE A DAY   glucose blood test strip Use as instructed. ONE TOUCH ULTRA TEST STRIPS What changed:  additional instructions   HYDROcodone-acetaminophen 5-325 MG tablet Commonly known as:  NORCO/VICODIN Take 1-2 tablets by mouth every 4 (four) hours as needed for moderate pain. Replaces:  Hydrocodone-Acetaminophen 5-300 MG Tabs   iron polysaccharides 150 MG capsule Commonly known as:  NIFEREX Take 150 mg by mouth 2 (two) times daily.   linagliptin 5 MG Tabs tablet Commonly known as:  TRADJENTA Take 5 mg by mouth daily.   lisinopril 5 MG tablet Commonly known as:  PRINIVIL,ZESTRIL TAKE 1 TABLET DAILY What changed:  how much to take  how to take this  when to take this  additional instructions   MAXIMUM D3 10000 units Caps Generic drug:  Cholecalciferol Take 10,000 Units by mouth daily.   metFORMIN 1000 MG tablet Commonly known as:  GLUCOPHAGE TAKE 1 TABLET TWICE A DAY WITH MEALS   metroNIDAZOLE 500 MG tablet Commonly known as:  FLAGYL Take 500 mg by mouth See admin instructions. Will take prior to procedure   NATURE-THROID 65 MG tablet Generic drug:  thyroid take 1/4 tablet daily   neomycin 500 MG tablet Commonly known as:  MYCIFRADIN Will take prior to procedure   norgestrel-ethinyl estradiol 0.3-30 MG-MCG tablet Commonly known as:  CRYSELLE-28 Take 1 tablet by mouth daily.   omega-3 acid ethyl esters 1 g capsule Commonly known as:  LOVAZA TAKE 2 CAPSULES BY MOUTH TWICE DAILY    ondansetron 4 MG disintegrating tablet Commonly known as:  ZOFRAN ODT Take 1 tablet (4 mg total) by mouth every 8 (eight) hours as needed for nausea or vomiting.   promethazine 25 MG tablet Commonly known as:  PHENERGAN Take 1 tablet (25 mg total) by mouth every 8 (eight) hours as needed for nausea or vomiting.   saxagliptin HCl 5 MG Tabs tablet Commonly known as:  ONGLYZA TAKE 1 TABLET(5 MG) BY MOUTH DAILY   tretinoin 0.025 % cream Commonly known as:  RETIN-A APPLY 1 APPLICATION TOPICALLY EVERY NIGHT AT BEDTIME What changed:  how much to take  how to take this  when to take this  additional instructions   VIIBRYD 40 MG Tabs Generic drug:  Vilazodone HCl Take 40 mg by mouth daily.        Signed: Odis Hollingshead 04/11/2016, 2:13 PM

## 2016-05-01 DIAGNOSIS — F330 Major depressive disorder, recurrent, mild: Secondary | ICD-10-CM | POA: Diagnosis not present

## 2016-05-07 ENCOUNTER — Other Ambulatory Visit: Payer: Self-pay | Admitting: Obstetrics & Gynecology

## 2016-05-08 DIAGNOSIS — F330 Major depressive disorder, recurrent, mild: Secondary | ICD-10-CM | POA: Diagnosis not present

## 2016-05-10 DIAGNOSIS — E739 Lactose intolerance, unspecified: Secondary | ICD-10-CM | POA: Insufficient documentation

## 2016-05-15 ENCOUNTER — Ambulatory Visit (INDEPENDENT_AMBULATORY_CARE_PROVIDER_SITE_OTHER): Payer: BLUE CROSS/BLUE SHIELD | Admitting: Obstetrics & Gynecology

## 2016-05-15 ENCOUNTER — Encounter: Payer: Self-pay | Admitting: Obstetrics & Gynecology

## 2016-05-15 VITALS — BP 104/64 | HR 76 | Resp 12 | Ht 63.75 in | Wt 111.6 lb

## 2016-05-15 DIAGNOSIS — N809 Endometriosis, unspecified: Secondary | ICD-10-CM | POA: Diagnosis not present

## 2016-05-15 DIAGNOSIS — L659 Nonscarring hair loss, unspecified: Secondary | ICD-10-CM

## 2016-05-15 DIAGNOSIS — N80129 Deep endometriosis of ovary, unspecified ovary: Secondary | ICD-10-CM

## 2016-05-15 DIAGNOSIS — N805 Endometriosis of intestine, unspecified: Secondary | ICD-10-CM

## 2016-05-15 DIAGNOSIS — E559 Vitamin D deficiency, unspecified: Secondary | ICD-10-CM

## 2016-05-15 DIAGNOSIS — D649 Anemia, unspecified: Secondary | ICD-10-CM

## 2016-05-15 LAB — IRON AND TIBC
%SAT: 11 % (ref 11–50)
IRON: 38 ug/dL — AB (ref 40–190)
TIBC: 348 ug/dL (ref 250–450)
UIBC: 310 ug/dL (ref 125–400)
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
## 2016-05-16 LAB — VITAMIN D 25 HYDROXY (VIT D DEFICIENCY, FRACTURES): VIT D 25 HYDROXY: 41 ng/mL (ref 30–100)

## 2016-05-16 LAB — FERRITIN: Ferritin: 15 ng/mL (ref 10–232)

## 2016-05-18 LAB — TESTOS,TOTAL,FREE AND SHBG (FEMALE)
Sex Hormone Binding Glob.: 103 nmol/L (ref 17–124)
TESTOSTERONE,TOTAL,LC/MS/MS: 18 ng/dL (ref 2–45)
Testosterone, Free: 1 pg/mL (ref 0.1–6.4)

## 2016-05-22 DIAGNOSIS — F330 Major depressive disorder, recurrent, mild: Secondary | ICD-10-CM | POA: Diagnosis not present

## 2016-05-22 DIAGNOSIS — D509 Iron deficiency anemia, unspecified: Secondary | ICD-10-CM | POA: Diagnosis not present

## 2016-05-22 DIAGNOSIS — R634 Abnormal weight loss: Secondary | ICD-10-CM | POA: Diagnosis not present

## 2016-05-28 ENCOUNTER — Other Ambulatory Visit: Payer: Self-pay | Admitting: Obstetrics & Gynecology

## 2016-05-28 ENCOUNTER — Telehealth: Payer: Self-pay | Admitting: *Deleted

## 2016-05-28 DIAGNOSIS — E611 Iron deficiency: Secondary | ICD-10-CM

## 2016-05-28 NOTE — Telephone Encounter (Signed)
-----   Message from Megan Salon, MD sent at 05/28/2016 11:56 AM EDT ----- Please let pt know her testosterone levels were normal.  Her Vit D was normal.   Her ferritin was low and her iron level was low.  Consider rechecking in 3 months vs referral to hematology for consideration of iron infusion.  I can refer her if that is what she desires.

## 2016-05-28 NOTE — Telephone Encounter (Signed)
Patient returned call. Results reviewed with patient and she verbalized understanding. Patient states that she would like to go ahead with a referral to hematology versus waiting 3 months to recheck. She states she has had low ferritin since October, and it is disheartening it has gone down since then. Patient states, "I feel like a slug and just have no energy, so I think it's best to proceed with referral." RN advised this message would be sent to Dr. Sabra Heck so that she can place referral and our referral coordinator would be in touch once referral processed. Patient agreeable.   Routing to provider for review.   [place]

## 2016-05-28 NOTE — Telephone Encounter (Signed)
Message left to return call to Denise Kerr at [phone].    

## 2016-05-29 DIAGNOSIS — F330 Major depressive disorder, recurrent, mild: Secondary | ICD-10-CM | POA: Diagnosis not present

## 2016-05-30 NOTE — Telephone Encounter (Signed)
Referral in place. Encounter closed.

## 2016-05-30 NOTE — Telephone Encounter (Signed)
Referral has been made to Dr. Marin Olp.

## 2016-06-05 DIAGNOSIS — F330 Major depressive disorder, recurrent, mild: Secondary | ICD-10-CM | POA: Diagnosis not present

## 2016-06-07 ENCOUNTER — Other Ambulatory Visit (HOSPITAL_BASED_OUTPATIENT_CLINIC_OR_DEPARTMENT_OTHER): Payer: BLUE CROSS/BLUE SHIELD

## 2016-06-07 ENCOUNTER — Ambulatory Visit (HOSPITAL_BASED_OUTPATIENT_CLINIC_OR_DEPARTMENT_OTHER): Payer: BLUE CROSS/BLUE SHIELD | Admitting: Family

## 2016-06-07 ENCOUNTER — Other Ambulatory Visit: Payer: Self-pay | Admitting: Family

## 2016-06-07 ENCOUNTER — Ambulatory Visit: Payer: BLUE CROSS/BLUE SHIELD

## 2016-06-07 VITALS — BP 119/74 | HR 100 | Temp 98.3°F | Resp 18 | Wt 114.0 lb

## 2016-06-07 DIAGNOSIS — D649 Anemia, unspecified: Secondary | ICD-10-CM

## 2016-06-07 DIAGNOSIS — K909 Intestinal malabsorption, unspecified: Secondary | ICD-10-CM | POA: Diagnosis not present

## 2016-06-07 DIAGNOSIS — R197 Diarrhea, unspecified: Secondary | ICD-10-CM

## 2016-06-07 DIAGNOSIS — D509 Iron deficiency anemia, unspecified: Secondary | ICD-10-CM | POA: Diagnosis not present

## 2016-06-07 DIAGNOSIS — D500 Iron deficiency anemia secondary to blood loss (chronic): Secondary | ICD-10-CM

## 2016-06-07 LAB — CBC WITH DIFFERENTIAL (CANCER CENTER ONLY)
BASO#: 0 10*3/uL (ref 0.0–0.2)
BASO%: 0.6 % (ref 0.0–2.0)
EOS ABS: 0.1 10*3/uL (ref 0.0–0.5)
EOS%: 1.7 % (ref 0.0–7.0)
HCT: 40.3 % (ref 34.8–46.6)
HEMOGLOBIN: 13.5 g/dL (ref 11.6–15.9)
LYMPH#: 1.6 10*3/uL (ref 0.9–3.3)
LYMPH%: 34.2 % (ref 14.0–48.0)
MCH: 30.1 pg (ref 26.0–34.0)
MCHC: 33.5 g/dL (ref 32.0–36.0)
MCV: 90 fL (ref 81–101)
MONO#: 0.4 10*3/uL (ref 0.1–0.9)
MONO%: 8 % (ref 0.0–13.0)
NEUT#: 2.7 10*3/uL (ref 1.5–6.5)
NEUT%: 55.5 % (ref 39.6–80.0)
PLATELETS: 207 10*3/uL (ref 145–400)
RBC: 4.49 10*6/uL (ref 3.70–5.32)
RDW: 13.3 % (ref 11.1–15.7)
WBC: 4.8 10*3/uL (ref 3.9–10.0)

## 2016-06-07 LAB — COMPREHENSIVE METABOLIC PANEL (CC13)
ALK PHOS: 86 IU/L (ref 39–117)
ALT: 12 IU/L (ref 0–32)
AST (SGOT): 14 IU/L (ref 0–40)
Albumin, Serum: 4.4 g/dL (ref 3.5–5.5)
Albumin/Globulin Ratio: 1.6 (ref 1.2–2.2)
BUN/Creatinine Ratio: 23 (ref 9–23)
BUN: 18 mg/dL (ref 6–24)
Bilirubin Total: 0.2 mg/dL (ref 0.0–1.2)
CALCIUM: 9.8 mg/dL (ref 8.7–10.2)
CO2: 27 mmol/L (ref 18–29)
CREATININE: 0.77 mg/dL (ref 0.57–1.00)
Chloride, Ser: 97 mmol/L (ref 96–106)
GFR calc Af Amer: 106 mL/min/{1.73_m2} (ref >59)
GFR, EST NON AFRICAN AMERICAN: 92 mL/min/{1.73_m2} (ref >59)
GLOBULIN, TOTAL: 2.8 g/dL (ref 1.5–4.5)
GLUCOSE: 105 mg/dL — AB (ref 65–99)
Potassium, Ser: 4.1 mmol/L (ref 3.5–5.2)
SODIUM: 138 mmol/L (ref 134–144)
Total Protein: 7.2 g/dL (ref 6.0–8.5)

## 2016-06-07 LAB — CHCC SATELLITE - SMEAR
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
## 2016-06-08 LAB — IRON AND TIBC
%SAT: 19 % — ABNORMAL LOW (ref 21–57)
Iron: 71 ug/dL (ref 41–142)
TIBC: 365 ug/dL (ref 236–444)
UIBC: 294 ug/dL (ref 120–384)

## 2016-06-08 LAB — RETICULOCYTES: RETICULOCYTE COUNT: 0.8 % (ref 0.6–2.6)

## 2016-06-08 LAB — FERRITIN: Ferritin: 15 ng/ml (ref 9–269)

## 2016-06-08 LAB — ERYTHROPOIETIN: ERYTHROPOIETIN: 12 m[IU]/mL (ref 2.6–18.5)

## 2016-06-08 MED ORDER — PANCRELIPASE (LIP-PROT-AMYL) 25000 UNITS PO CPEP: 25000.0000 [IU] | ORAL_CAPSULE | Freq: Three times a day (TID) | ORAL | 0 refills | Status: DC

## 2016-06-09 ENCOUNTER — Encounter: Payer: Self-pay | Admitting: Family

## 2016-06-09 DIAGNOSIS — N921 Excessive and frequent menstruation with irregular cycle: Secondary | ICD-10-CM

## 2016-06-09 DIAGNOSIS — K6389 Other specified diseases of intestine: Secondary | ICD-10-CM | POA: Insufficient documentation

## 2016-06-09 DIAGNOSIS — K909 Intestinal malabsorption, unspecified: Secondary | ICD-10-CM

## 2016-06-09 DIAGNOSIS — D500 Iron deficiency anemia secondary to blood loss (chronic): Secondary | ICD-10-CM

## 2016-06-09 HISTORY — DX: Excessive and frequent menstruation with irregular cycle: N92.1

## 2016-06-09 HISTORY — DX: Intestinal malabsorption, unspecified: K90.9

## 2016-06-09 HISTORY — DX: Iron deficiency anemia secondary to blood loss (chronic): D50.0

## 2016-06-11 ENCOUNTER — Other Ambulatory Visit: Payer: Self-pay

## 2016-06-11 DIAGNOSIS — D500 Iron deficiency anemia secondary to blood loss (chronic): Secondary | ICD-10-CM

## 2016-06-11 DIAGNOSIS — K909 Intestinal malabsorption, unspecified: Secondary | ICD-10-CM

## 2016-06-11 DIAGNOSIS — R197 Diarrhea, unspecified: Secondary | ICD-10-CM

## 2016-06-11 MED ORDER — PANCRELIPASE (LIP-PROT-AMYL) 25000 UNITS PO CPEP: 25000.0000 [IU] | ORAL_CAPSULE | Freq: Three times a day (TID) | ORAL | 0 refills | Status: DC

## 2016-06-12 DIAGNOSIS — F330 Major depressive disorder, recurrent, mild: Secondary | ICD-10-CM | POA: Diagnosis not present

## 2016-06-15 ENCOUNTER — Ambulatory Visit (HOSPITAL_BASED_OUTPATIENT_CLINIC_OR_DEPARTMENT_OTHER): Payer: BLUE CROSS/BLUE SHIELD

## 2016-06-15 VITALS — BP 97/68 | HR 85 | Temp 97.8°F | Resp 17

## 2016-06-15 DIAGNOSIS — D509 Iron deficiency anemia, unspecified: Secondary | ICD-10-CM

## 2016-06-15 DIAGNOSIS — D500 Iron deficiency anemia secondary to blood loss (chronic): Secondary | ICD-10-CM

## 2016-06-15 DIAGNOSIS — K909 Intestinal malabsorption, unspecified: Secondary | ICD-10-CM

## 2016-06-15 MED ORDER — SODIUM CHLORIDE 0.9 % IV SOLN
Freq: Once | INTRAVENOUS | Status: AC
  Administered 2016-06-15: 15:00:00 via INTRAVENOUS

## 2016-06-15 MED ORDER — SODIUM CHLORIDE 0.9 % IV SOLN
510.0000 mg | Freq: Once | INTRAVENOUS | Status: AC
  Administered 2016-06-15: 510 mg via INTRAVENOUS
  Filled 2016-06-15: qty 17

## 2016-06-15 NOTE — Patient Instructions (Signed)

## 2016-06-18 ENCOUNTER — Encounter: Payer: Self-pay | Admitting: Family

## 2016-06-19 DIAGNOSIS — F330 Major depressive disorder, recurrent, mild: Secondary | ICD-10-CM | POA: Diagnosis not present

## 2016-06-21 ENCOUNTER — Encounter: Payer: Self-pay | Admitting: Certified Nurse Midwife

## 2016-06-21 ENCOUNTER — Ambulatory Visit (INDEPENDENT_AMBULATORY_CARE_PROVIDER_SITE_OTHER): Payer: BLUE CROSS/BLUE SHIELD | Admitting: Certified Nurse Midwife

## 2016-06-21 VITALS — BP 114/70 | HR 64 | Resp 16 | Ht 63.75 in | Wt 113.0 lb

## 2016-06-21 DIAGNOSIS — B3731 Acute candidiasis of vulva and vagina: Secondary | ICD-10-CM

## 2016-06-21 DIAGNOSIS — F410 Panic disorder [episodic paroxysmal anxiety] without agoraphobia: Secondary | ICD-10-CM | POA: Diagnosis not present

## 2016-06-21 DIAGNOSIS — B373 Candidiasis of vulva and vagina: Secondary | ICD-10-CM

## 2016-06-21 DIAGNOSIS — N912 Amenorrhea, unspecified: Secondary | ICD-10-CM

## 2016-06-21 DIAGNOSIS — F3342 Major depressive disorder, recurrent, in full remission: Secondary | ICD-10-CM | POA: Diagnosis not present

## 2016-06-21 LAB — POCT URINE PREGNANCY: Preg Test, Ur: NEGATIVE

## 2016-06-21 MED ORDER — TERCONAZOLE 0.4 % VA CREA: 1.0000 | TOPICAL_CREAM | Freq: Every day | VAGINAL | 0 refills | Status: DC

## 2016-06-21 MED ORDER — NYSTATIN-TRIAMCINOLONE 100000-0.1 UNIT/GM-% EX OINT: 1.0000 "application " | TOPICAL_OINTMENT | Freq: Two times a day (BID) | CUTANEOUS | 0 refills | Status: DC

## 2016-06-21 NOTE — Progress Notes (Signed)
Encounter reviewed Denise Wesolowski, MD   

## 2016-06-21 NOTE — Patient Instructions (Signed)

## 2016-06-21 NOTE — Progress Notes (Signed)
46 y.o. Single Caucasian female [id number] here with complaint of vaginal symptoms of itching, burning, and increase discharge. Describes discharge as normal, but feels very uncomfortable in vaginal area with irritation..Onset of symptoms 2 weeks ago. Denies new personal products. Recent surgery for endometrioma still under follow up with Dr. Jonette Eva, Dr. Collene Mares. Concerned about appearance of scar and small ridge. Non tender, no redness or tenderness.  No other health issues.  Urinary symptoms none .   ROS pertinent to HPI  O:Healthy female WDWN Affect: normal, orientation x 3  Exam:Skin warm and dry Abdomen:soft, non tender, low transverse scar has healed well with very little ridge noted.  Inguinal Lymph nodes: no enlargement or tenderness Pelvic exam: External genital: normal female with scaling and scant exudate BUS: negative Vagina: scant white discharge noted. Ph:4.0   ,Wet prep taken,  Cervix: normal, non tender, no CMT Uterus: normal, non tender Adnexa:normal, non tender, no masses or fullness noted upt-neg  Wet Prep results: KOH, Saline + yeast both vaginal and external vulva area.   A:Normal pelvic exam Yeast vaginitis/vulvitis  P: Discussed finding of normal pelvic exam.  Discussed yeast vaginitis/vulvitis finding and need for treatment. Comfort measures of Aveeno sitz bath. Rx Terazol 7 vaginal cream with instructions, see order Rx Nystatin/Triamcinolone ointment  See order with instructions.   Rv prn

## 2016-06-26 DIAGNOSIS — F330 Major depressive disorder, recurrent, mild: Secondary | ICD-10-CM | POA: Diagnosis not present

## 2016-07-03 DIAGNOSIS — F330 Major depressive disorder, recurrent, mild: Secondary | ICD-10-CM | POA: Diagnosis not present

## 2016-07-04 ENCOUNTER — Telehealth: Payer: Self-pay | Admitting: Certified Nurse Midwife

## 2016-07-04 MED ORDER — FLUCONAZOLE 150 MG PO TABS: ORAL_TABLET | ORAL | 0 refills | Status: DC

## 2016-07-04 NOTE — Telephone Encounter (Signed)
Patient wants to speak with the nurse. She has some questions and may need to come in.

## 2016-07-04 NOTE — Telephone Encounter (Signed)
Spoke with patient. Patient was seen in the office on 06/21/2016 with Melvia Heaps CNM. Patient states that she used Terazol cream for yeast infection for 5 nights and then started her menses. Patient is now having vaginal itching and thick white discharge again. Asking if she can use Terazol again or if she can try and alternative medication. Advised I will review with covering provider as Melvia Heaps CNM is out of the office today. Patient is agreeable.  Routing to Kem Boroughs, FNP for review and advise.

## 2016-07-04 NOTE — Telephone Encounter (Signed)
Lets try Diflucan 150 mg X 2 doses and see if this helps.

## 2016-07-04 NOTE — Telephone Encounter (Signed)
Spoke with patient. Advised of message as seen below from Denise Kerr, [place]. Patient is agreeable and verbalizes understanding. Rx for Diflucan 150 mg take 1 tablet po once, repeat in 72 hours if symptoms persist#2 0RF sent to pharmacy on file.  Routing to covering provider for final review. Patient agreeable to disposition. Will close encounter.

## 2016-07-10 DIAGNOSIS — F330 Major depressive disorder, recurrent, mild: Secondary | ICD-10-CM | POA: Diagnosis not present

## 2016-07-11 DIAGNOSIS — E119 Type 2 diabetes mellitus without complications: Secondary | ICD-10-CM | POA: Diagnosis not present

## 2016-07-11 DIAGNOSIS — E038 Other specified hypothyroidism: Secondary | ICD-10-CM | POA: Diagnosis not present

## 2016-07-11 DIAGNOSIS — Z0000 Encounter for general adult medical examination without abnormal findings: Secondary | ICD-10-CM | POA: Diagnosis not present

## 2016-07-12 ENCOUNTER — Other Ambulatory Visit: Payer: Self-pay | Admitting: *Deleted

## 2016-07-13 ENCOUNTER — Other Ambulatory Visit (HOSPITAL_BASED_OUTPATIENT_CLINIC_OR_DEPARTMENT_OTHER): Payer: BLUE CROSS/BLUE SHIELD

## 2016-07-13 DIAGNOSIS — K909 Intestinal malabsorption, unspecified: Secondary | ICD-10-CM

## 2016-07-13 DIAGNOSIS — D509 Iron deficiency anemia, unspecified: Secondary | ICD-10-CM | POA: Diagnosis not present

## 2016-07-13 DIAGNOSIS — D500 Iron deficiency anemia secondary to blood loss (chronic): Secondary | ICD-10-CM | POA: Diagnosis not present

## 2016-07-13 DIAGNOSIS — R197 Diarrhea, unspecified: Secondary | ICD-10-CM

## 2016-07-13 LAB — CBC WITH DIFFERENTIAL (CANCER CENTER ONLY)
BASO#: 0 10*3/uL (ref 0.0–0.2)
BASO%: 0.3 % (ref 0.0–2.0)
EOS ABS: 0.1 10*3/uL (ref 0.0–0.5)
EOS%: 1.4 % (ref 0.0–7.0)
HCT: 41.3 % (ref 34.8–46.6)
HEMOGLOBIN: 13.7 g/dL (ref 11.6–15.9)
LYMPH#: 1.7 10*3/uL (ref 0.9–3.3)
LYMPH%: 21.7 % (ref 14.0–48.0)
MCH: 30.6 pg (ref 26.0–34.0)
MCHC: 33.2 g/dL (ref 32.0–36.0)
MCV: 92 fL (ref 81–101)
MONO#: 0.5 10*3/uL (ref 0.1–0.9)
MONO%: 7 % (ref 0.0–13.0)
NEUT#: 5.4 10*3/uL (ref 1.5–6.5)
NEUT%: 69.6 % (ref 39.6–80.0)
Platelets: 205 10*3/uL (ref 145–400)
RBC: 4.47 10*6/uL (ref 3.70–5.32)
RDW: 13.7 % (ref 11.1–15.7)
WBC: 7.7 10*3/uL (ref 3.9–10.0)

## 2016-07-13 LAB — RETICULOCYTES: RETICULOCYTE COUNT: 1.3 % (ref 0.6–2.6)

## 2016-07-14 LAB — VITAMIN B12: Vitamin B12: 612 pg/mL (ref 232–1245)

## 2016-07-16 ENCOUNTER — Encounter: Payer: Self-pay | Admitting: *Deleted

## 2016-07-16 LAB — FERRITIN: FERRITIN: 107 ng/mL (ref 9–269)

## 2016-07-16 LAB — IRON AND TIBC
%SAT: 26 % (ref 21–57)
IRON: 66 ug/dL (ref 41–142)
TIBC: 256 ug/dL (ref 236–444)
UIBC: 190 ug/dL (ref 120–384)

## 2016-07-17 DIAGNOSIS — F330 Major depressive disorder, recurrent, mild: Secondary | ICD-10-CM | POA: Diagnosis not present

## 2016-07-18 DIAGNOSIS — E611 Iron deficiency: Secondary | ICD-10-CM | POA: Diagnosis not present

## 2016-07-18 DIAGNOSIS — E119 Type 2 diabetes mellitus without complications: Secondary | ICD-10-CM | POA: Diagnosis not present

## 2016-07-18 DIAGNOSIS — K5289 Other specified noninfective gastroenteritis and colitis: Secondary | ICD-10-CM | POA: Diagnosis not present

## 2016-07-18 DIAGNOSIS — Z0000 Encounter for general adult medical examination without abnormal findings: Secondary | ICD-10-CM | POA: Diagnosis not present

## 2016-07-18 DIAGNOSIS — E038 Other specified hypothyroidism: Secondary | ICD-10-CM | POA: Diagnosis not present

## 2016-07-18 DIAGNOSIS — Z1389 Encounter for screening for other disorder: Secondary | ICD-10-CM | POA: Diagnosis not present

## 2016-07-19 ENCOUNTER — Ambulatory Visit (HOSPITAL_BASED_OUTPATIENT_CLINIC_OR_DEPARTMENT_OTHER): Payer: BLUE CROSS/BLUE SHIELD | Admitting: Hematology & Oncology

## 2016-07-19 ENCOUNTER — Other Ambulatory Visit: Payer: BLUE CROSS/BLUE SHIELD

## 2016-07-19 VITALS — BP 97/70 | HR 93 | Temp 97.9°F | Resp 18 | Wt 115.4 lb

## 2016-07-19 DIAGNOSIS — D500 Iron deficiency anemia secondary to blood loss (chronic): Secondary | ICD-10-CM

## 2016-07-19 DIAGNOSIS — R5382 Chronic fatigue, unspecified: Secondary | ICD-10-CM | POA: Diagnosis not present

## 2016-07-19 DIAGNOSIS — E0810 Diabetes mellitus due to underlying condition with ketoacidosis without coma: Secondary | ICD-10-CM

## 2016-07-19 DIAGNOSIS — D509 Iron deficiency anemia, unspecified: Secondary | ICD-10-CM | POA: Diagnosis not present

## 2016-07-19 DIAGNOSIS — E119 Type 2 diabetes mellitus without complications: Secondary | ICD-10-CM | POA: Diagnosis not present

## 2016-07-19 DIAGNOSIS — E785 Hyperlipidemia, unspecified: Secondary | ICD-10-CM

## 2016-07-19 DIAGNOSIS — D490 Neoplasm of unspecified behavior of digestive system: Secondary | ICD-10-CM

## 2016-07-19 DIAGNOSIS — F5101 Primary insomnia: Secondary | ICD-10-CM

## 2016-07-19 NOTE — Progress Notes (Signed)
Hematology and Oncology Follow Up Visit  Denise Kerr [id number] 05/11/1970 46 y.o. 07/19/2016   Principle Diagnosis:   Iron deficiency anemia due to malabsorption  Chronic fatigue  Non-insulin-dependent diabetes  Current Therapy:    IV iron-last dose given on 06/15/2016     Interim History:  Ms. Begeman is back for follow-up. We first saw her in early April. She had iron deficiency. We checked her iron studies, her ferritin was 15 with an iron saturation of 19%.  We gave her IV iron. Unfortunately, she does not feel any better. She probably feels worse. She has a myriad of complaints. I'm not sure exactly what we can really do regarding this. She's had a very thorough evaluation from a hematologic standpoint so far. She has a normal vitamin B-12 level. She does not have a issue with thyroid. She's been tested for testosterone which is been okay.  She really has a limited quality of life. She says she just really cannot do much. She says that a boyfriend had to bring her to the office today because she was just so fatigued.  She's had no fever. She's had no rashes. She's had some bruising.  There is then no change in bowels or bladder.  Overall, her performance status is ECOG 2-3.  Medications:  Current Outpatient Prescriptions:  .  ALPRAZolam (XANAX) 0.5 MG tablet, Take 0.5 mg by mouth daily as needed for anxiety or sleep. , Disp: , Rfl:  .  atorvastatin (LIPITOR) 10 MG tablet, Take 10 mg by mouth daily at 6 PM. Takes with lisinopril, Disp: , Rfl:  .  buPROPion (WELLBUTRIN XL) 300 MG 24 hr tablet, Take 300 mg by mouth daily. Takes along with 150 mg to equal 450 mg daily, Disp: , Rfl:  .  canagliflozin (INVOKANA) 100 MG TABS tablet, Take 1 tablet (100 mg total) by mouth daily. BEFORE first meal of the day, Disp: 30 tablet, Rfl: 5 .  fluconazole (DIFLUCAN) 150 MG tablet, Take 1 tablet by mouth once, repeat in 72 hours if symptoms persist., Disp: 2 tablet, Rfl: 0 .  gabapentin  (NEURONTIN) 100 MG capsule, TK 1 C PO QID, Disp: , Rfl: 12 .  glucose blood test strip, Use as instructed. ONE TOUCH ULTRA TEST STRIPS (Patient taking differently: Test 2-3 times daily ONE TOUCH ULTRA TEST STRIPS), Disp: 100 each, Rfl: PRN .  HYDROcodone-acetaminophen (NORCO/VICODIN) 5-325 MG tablet, Take 1-2 tablets by mouth every 4 (four) hours as needed for moderate pain., Disp: 40 tablet, Rfl: 0 .  lisinopril (PRINIVIL,ZESTRIL) 5 MG tablet, TAKE 1 TABLET DAILY (Patient taking differently: Take 5 mg by mouth every evening. ), Disp: 90 tablet, Rfl: 0 .  metFORMIN (GLUCOPHAGE) 1000 MG tablet, TAKE 1 TABLET TWICE A DAY WITH MEALS, Disp: 180 tablet, Rfl: 0 .  NATURE-THROID 65 MG tablet, take 1/4 tablet daily, Disp: , Rfl: 11 .  nystatin-triamcinolone ointment (MYCOLOG), Apply 1 application topically 2 (two) times daily., Disp: 30 g, Rfl: 0 .  promethazine (PHENERGAN) 25 MG tablet, Take 1 tablet (25 mg total) by mouth every 8 (eight) hours as needed for nausea or vomiting., Disp: 30 tablet, Rfl: 0 .  terconazole (TERAZOL 7) 0.4 % vaginal cream, Place 1 applicator vaginally at bedtime., Disp: 45 g, Rfl: 0 .  tretinoin (RETIN-A) 0.025 % cream, APPLY 1 APPLICATION TOPICALLY EVERY NIGHT AT BEDTIME (Patient taking differently: Apply 1 application topically 2 (two) times daily. ), Disp: 45 g, Rfl: 1 .  Vilazodone HCl (VIIBRYD) 40 MG  TABS, Take 40 mg by mouth daily., Disp: , Rfl:  .  zaleplon (SONATA) 10 MG capsule, TK 2 CS PO QHS, Disp: , Rfl: 5 .  ZENPEP 25000-79000 units CPEP, TK 1 C PO TID BEFORE MEALS, Disp: , Rfl: 0  Allergies:  Allergies  Allergen Reactions  . Fetzima [Levomilnacipran]     Elevated liver enzymes per MD notes    Past Medical History, Surgical history, Social history, and Family History were reviewed and updated.  Review of Systems:  As above  Physical Exam:  weight is 115 lb 6.1 oz (52.3 kg). Her oral temperature is 97.9 F (36.6 C). Her blood pressure is 97/70 and her pulse  is 93. Her respiration is 18 and oxygen saturation is 100%.   Wt Readings from Last 3 Encounters:  07/19/16 115 lb 6.1 oz (52.3 kg)  06/21/16 113 lb (51.3 kg)  06/07/16 114 lb (51.7 kg)      Slight pale appearing white female in no obvious distress. Head and neck exam shows no ocular or oral lesions. There are no palpable cervical or supraclavicular lymph nodes. Lungs are clear bilaterally. Cardiac exam regular rate and rhythm with no murmurs, rubs or bruits. Abdomen is soft. She has decent bowel sounds. She has healed laparotomy scar. There is no fluid wave. There is no palpable abdominal mass. There is no guarding or rebound tenderness. Back exam shows no tenderness over the spine, ribs or hips. Extremities shows no clubbing, cyanosis or edema. Skin exam shows no rashes, ecchymoses or petechia. Neurological exam shows no focal neurological deficits.  Lab Results  Component Value Date   WBC 7.7 07/13/2016   HGB 13.7 07/13/2016   HCT 41.3 07/13/2016   MCV 92 07/13/2016   PLT 205 07/13/2016     Chemistry      Component Value Date/Time   NA 138 06/07/2016 1356   K 4.1 06/07/2016 1356   CL 97 06/07/2016 1356   CO2 27 06/07/2016 1356   BUN 18 06/07/2016 1356   CREATININE 0.77 06/07/2016 1356   CREATININE 0.78 05/26/2014 0001      Component Value Date/Time   CALCIUM 9.8 06/07/2016 1356   ALKPHOS 86 06/07/2016 1356   AST 14 06/07/2016 1356   ALT 12 06/07/2016 1356   BILITOT 0.2 06/07/2016 1356         Impression and Plan: Ms. Bhavsar is a 46 year old white female. She has iron deficiency anemia.  I'm still not sure exactly what is going on with her. I will think any doctor knows what exactly is going on with her.  I think that the only thing that we can offer is a bone marrow biopsy. I talked to her at length about this. I told her that I cannot think of any blood tests that they really help [place] out. She clearly is not iron deficient any longer. She had lab work done on May 18.  This showed a ferritin of 107 with iron saturation of 26%.  She sees other physicians.  She had her abdominal surgery about 3 have months ago. I would think that she has gotten over this.  We will order a bone marrow biopsy on her. We will have to see what this shows.  We will plan to get her back in about 6 weeks.  I spent about 45 minutes with her. I just feel bad that she is not feeling better.   Volanda Napoleon, MD 5/24/20186:02 PM

## 2016-07-24 DIAGNOSIS — F330 Major depressive disorder, recurrent, mild: Secondary | ICD-10-CM | POA: Diagnosis not present

## 2016-07-27 ENCOUNTER — Encounter: Payer: Self-pay | Admitting: Hematology & Oncology

## 2016-07-30 ENCOUNTER — Encounter: Payer: Self-pay | Admitting: *Deleted

## 2016-07-30 ENCOUNTER — Telehealth: Payer: Self-pay | Admitting: *Deleted

## 2016-07-30 NOTE — Telephone Encounter (Addendum)
Called patient with no answer. Left message on voice mail asking her to call the number below or this office to follow up on bone marrow. Message also sent through My Chart requesting follow up.   ----- Message from Volanda Napoleon, MD sent at 07/30/2016 10:28 AM EDT ----- Regarding: FW: biopsy  Denise Kerr:  Please call her and find out what the issue is with her not getting the bone marrow biopsy scheduled?? Thanks!!  Laurey Arrow ----- Message ----- From: Alecia Lemming Sent: 07/30/6801   9:20 AM To: Volanda Napoleon, MD Subject: biopsy                                         Have called patient several times she will not return the call.can someone from your office call patient have her call [phone]. Thanks Elmo Putt

## 2016-07-31 DIAGNOSIS — F330 Major depressive disorder, recurrent, mild: Secondary | ICD-10-CM | POA: Diagnosis not present

## 2016-07-31 DIAGNOSIS — R194 Change in bowel habit: Secondary | ICD-10-CM | POA: Diagnosis not present

## 2016-07-31 DIAGNOSIS — D509 Iron deficiency anemia, unspecified: Secondary | ICD-10-CM | POA: Diagnosis not present

## 2016-08-07 DIAGNOSIS — F330 Major depressive disorder, recurrent, mild: Secondary | ICD-10-CM | POA: Diagnosis not present

## 2016-08-08 ENCOUNTER — Encounter: Payer: Self-pay | Admitting: Family

## 2016-08-09 ENCOUNTER — Other Ambulatory Visit: Payer: Self-pay | Admitting: Radiology

## 2016-08-10 ENCOUNTER — Ambulatory Visit (HOSPITAL_COMMUNITY): Payer: BLUE CROSS/BLUE SHIELD

## 2016-08-10 ENCOUNTER — Ambulatory Visit (HOSPITAL_COMMUNITY): Admission: RE | Admit: 2016-08-10 | Payer: BLUE CROSS/BLUE SHIELD | Source: Ambulatory Visit

## 2016-08-14 DIAGNOSIS — F330 Major depressive disorder, recurrent, mild: Secondary | ICD-10-CM | POA: Diagnosis not present

## 2016-08-17 DIAGNOSIS — E611 Iron deficiency: Secondary | ICD-10-CM | POA: Diagnosis not present

## 2016-08-21 DIAGNOSIS — F330 Major depressive disorder, recurrent, mild: Secondary | ICD-10-CM | POA: Diagnosis not present

## 2016-08-28 DIAGNOSIS — F330 Major depressive disorder, recurrent, mild: Secondary | ICD-10-CM | POA: Diagnosis not present

## 2016-08-31 DIAGNOSIS — E611 Iron deficiency: Secondary | ICD-10-CM | POA: Diagnosis not present

## 2016-08-31 DIAGNOSIS — E038 Other specified hypothyroidism: Secondary | ICD-10-CM | POA: Diagnosis not present

## 2016-08-31 DIAGNOSIS — K5289 Other specified noninfective gastroenteritis and colitis: Secondary | ICD-10-CM | POA: Diagnosis not present

## 2016-08-31 DIAGNOSIS — Z6820 Body mass index (BMI) 20.0-20.9, adult: Secondary | ICD-10-CM | POA: Diagnosis not present

## 2016-09-04 DIAGNOSIS — F330 Major depressive disorder, recurrent, mild: Secondary | ICD-10-CM | POA: Diagnosis not present

## 2016-09-11 DIAGNOSIS — F330 Major depressive disorder, recurrent, mild: Secondary | ICD-10-CM | POA: Diagnosis not present

## 2016-09-18 DIAGNOSIS — F330 Major depressive disorder, recurrent, mild: Secondary | ICD-10-CM | POA: Diagnosis not present

## 2016-09-18 DIAGNOSIS — E119 Type 2 diabetes mellitus without complications: Secondary | ICD-10-CM | POA: Diagnosis not present

## 2016-09-25 DIAGNOSIS — F330 Major depressive disorder, recurrent, mild: Secondary | ICD-10-CM | POA: Diagnosis not present

## 2016-10-02 DIAGNOSIS — F330 Major depressive disorder, recurrent, mild: Secondary | ICD-10-CM | POA: Diagnosis not present

## 2016-10-08 ENCOUNTER — Encounter: Payer: Self-pay | Admitting: Hematology & Oncology

## 2016-10-09 DIAGNOSIS — F330 Major depressive disorder, recurrent, mild: Secondary | ICD-10-CM | POA: Diagnosis not present

## 2016-10-10 ENCOUNTER — Encounter: Payer: Self-pay | Admitting: Hematology & Oncology

## 2016-10-16 DIAGNOSIS — F330 Major depressive disorder, recurrent, mild: Secondary | ICD-10-CM | POA: Diagnosis not present

## 2016-10-18 DIAGNOSIS — F341 Dysthymic disorder: Secondary | ICD-10-CM | POA: Diagnosis not present

## 2016-10-23 DIAGNOSIS — F330 Major depressive disorder, recurrent, mild: Secondary | ICD-10-CM | POA: Diagnosis not present

## 2016-10-31 DIAGNOSIS — F330 Major depressive disorder, recurrent, mild: Secondary | ICD-10-CM | POA: Diagnosis not present

## 2016-11-06 DIAGNOSIS — F330 Major depressive disorder, recurrent, mild: Secondary | ICD-10-CM | POA: Diagnosis not present

## 2016-11-07 ENCOUNTER — Other Ambulatory Visit: Payer: Self-pay | Admitting: Hematology & Oncology

## 2016-11-07 DIAGNOSIS — E785 Hyperlipidemia, unspecified: Secondary | ICD-10-CM

## 2016-11-07 DIAGNOSIS — D490 Neoplasm of unspecified behavior of digestive system: Secondary | ICD-10-CM

## 2016-11-07 DIAGNOSIS — K909 Intestinal malabsorption, unspecified: Secondary | ICD-10-CM

## 2016-11-07 DIAGNOSIS — F411 Generalized anxiety disorder: Secondary | ICD-10-CM

## 2016-11-07 DIAGNOSIS — D500 Iron deficiency anemia secondary to blood loss (chronic): Secondary | ICD-10-CM

## 2016-11-08 DIAGNOSIS — Z23 Encounter for immunization: Secondary | ICD-10-CM | POA: Diagnosis not present

## 2016-11-08 DIAGNOSIS — E611 Iron deficiency: Secondary | ICD-10-CM | POA: Diagnosis not present

## 2016-11-08 DIAGNOSIS — E038 Other specified hypothyroidism: Secondary | ICD-10-CM | POA: Diagnosis not present

## 2016-11-08 DIAGNOSIS — K529 Noninfective gastroenteritis and colitis, unspecified: Secondary | ICD-10-CM | POA: Diagnosis not present

## 2016-11-08 DIAGNOSIS — E119 Type 2 diabetes mellitus without complications: Secondary | ICD-10-CM | POA: Diagnosis not present

## 2016-11-09 ENCOUNTER — Other Ambulatory Visit: Payer: BLUE CROSS/BLUE SHIELD

## 2016-11-09 ENCOUNTER — Ambulatory Visit: Payer: BLUE CROSS/BLUE SHIELD | Admitting: Hematology & Oncology

## 2016-11-13 DIAGNOSIS — F330 Major depressive disorder, recurrent, mild: Secondary | ICD-10-CM | POA: Diagnosis not present

## 2016-11-15 ENCOUNTER — Ambulatory Visit (HOSPITAL_BASED_OUTPATIENT_CLINIC_OR_DEPARTMENT_OTHER): Payer: BLUE CROSS/BLUE SHIELD

## 2016-11-15 DIAGNOSIS — K909 Intestinal malabsorption, unspecified: Secondary | ICD-10-CM

## 2016-11-15 DIAGNOSIS — D500 Iron deficiency anemia secondary to blood loss (chronic): Secondary | ICD-10-CM | POA: Diagnosis not present

## 2016-11-15 DIAGNOSIS — E785 Hyperlipidemia, unspecified: Secondary | ICD-10-CM

## 2016-11-15 DIAGNOSIS — C186 Malignant neoplasm of descending colon: Secondary | ICD-10-CM

## 2016-11-15 DIAGNOSIS — D490 Neoplasm of unspecified behavior of digestive system: Secondary | ICD-10-CM

## 2016-11-15 DIAGNOSIS — F411 Generalized anxiety disorder: Secondary | ICD-10-CM

## 2016-11-15 LAB — IRON AND TIBC
%SAT: 38 % (ref 21–57)
Iron: 107 ug/dL (ref 41–142)
TIBC: 286 ug/dL (ref 236–444)
UIBC: 179 ug/dL (ref 120–384)

## 2016-11-15 LAB — TSH: TSH: 1.159 m(IU)/L (ref 0.308–3.960)

## 2016-11-15 LAB — CBC WITH DIFFERENTIAL (CANCER CENTER ONLY)
BASO#: 0 10*3/uL (ref 0.0–0.2)
BASO%: 0.4 % (ref 0.0–2.0)
EOS ABS: 0.1 10*3/uL (ref 0.0–0.5)
EOS%: 1.8 % (ref 0.0–7.0)
HCT: 41.3 % (ref 34.8–46.6)
HGB: 13.8 g/dL (ref 11.6–15.9)
LYMPH#: 1.4 10*3/uL (ref 0.9–3.3)
LYMPH%: 24.2 % (ref 14.0–48.0)
MCH: 30.9 pg (ref 26.0–34.0)
MCHC: 33.4 g/dL (ref 32.0–36.0)
MCV: 92 fL (ref 81–101)
MONO#: 0.3 10*3/uL (ref 0.1–0.9)
MONO%: 5.6 % (ref 0.0–13.0)
NEUT#: 3.8 10*3/uL (ref 1.5–6.5)
NEUT%: 68 % (ref 39.6–80.0)
Platelets: 194 10*3/uL (ref 145–400)
RBC: 4.47 10*6/uL (ref 3.70–5.32)
RDW: 12.7 % (ref 11.1–15.7)
WBC: 5.6 10*3/uL (ref 3.9–10.0)

## 2016-11-15 LAB — COMPREHENSIVE METABOLIC PANEL
ALBUMIN: 4.1 g/dL (ref 3.5–5.0)
ALK PHOS: 75 U/L (ref 40–150)
ALT: 13 U/L (ref 0–55)
AST: 18 U/L (ref 5–34)
Anion Gap: 10 mEq/L (ref 3–11)
BUN: 19.3 mg/dL (ref 7.0–26.0)
CALCIUM: 9.7 mg/dL (ref 8.4–10.4)
CHLORIDE: 102 meq/L (ref 98–109)
CO2: 26 mEq/L (ref 22–29)
Creatinine: 0.8 mg/dL (ref 0.6–1.1)
EGFR: 85 mL/min/{1.73_m2} — AB (ref >90)
GLUCOSE: 130 mg/dL (ref 70–140)
POTASSIUM: 3.9 meq/L (ref 3.5–5.1)
SODIUM: 138 meq/L (ref 136–145)
Total Bilirubin: 0.5 mg/dL (ref 0.20–1.20)
Total Protein: 7.2 g/dL (ref 6.4–8.3)

## 2016-11-15 LAB — FERRITIN: FERRITIN: 55 ng/mL (ref 9–269)

## 2016-11-16 LAB — SOLUBLE TRANSFERRIN RECEPTOR: Transferrin Receptor: 14 nmol/L (ref 12.2–27.3)

## 2016-11-16 LAB — GASTRIN: Gastrin, Serum: 37 pg/mL (ref 0–115)

## 2016-11-18 ENCOUNTER — Encounter: Payer: Self-pay | Admitting: Hematology & Oncology

## 2016-11-19 LAB — PROTEIN ELECTROPHORESIS, SERUM, WITH REFLEX
A/G RATIO SPE: 1.3 (ref 0.7–1.7)
ALBUMIN: 3.7 g/dL (ref 2.9–4.4)
Alpha 1: 0.2 g/dL (ref 0.0–0.4)
Alpha 2: 0.6 g/dL (ref 0.4–1.0)
BETA: 1 g/dL (ref 0.7–1.3)
GAMMA GLOBULIN: 1 g/dL (ref 0.4–1.8)
Globulin, Total: 2.9 g/dL (ref 2.2–3.9)
Total Protein: 6.6 g/dL (ref 6.0–8.5)

## 2016-11-20 DIAGNOSIS — F330 Major depressive disorder, recurrent, mild: Secondary | ICD-10-CM | POA: Diagnosis not present

## 2016-11-20 LAB — HEMOCHROMATOSIS DNA-PCR(C282Y,H63D)

## 2016-11-21 DIAGNOSIS — D490 Neoplasm of unspecified behavior of digestive system: Secondary | ICD-10-CM | POA: Diagnosis not present

## 2016-11-23 LAB — UIFE/LIGHT CHAINS/TP QN, 24-HR UR

## 2016-11-24 LAB — METANEPHRINES, URINE, 24 HOUR
METANEPH TOTAL UR: 64 ug/L
Metanephrines, 24H Ur: 109 ug/24 hr (ref 45–290)
NORMETANEPHRINE 24H UR: 180 ug/(24.h) (ref 82–500)
NORMETANEPHRINE UR: 106 ug/L

## 2016-11-26 LAB — CATECHOLAMINES, FRACTIONATED, URINE, 24 HOUR
DOPAMINE 24H UR: 70 ug/(24.h) (ref 0–510)
Dopamine, Rand Ur: 41 ug/L
EPINEPHRINE 24H UR: 7 ug/(24.h) (ref 0–20)
EPINEPHRINE RAND UR: 4 ug/L
Norepinephrine, 24H Ur: 24 ug/24 hr (ref 0–135)
Norepinephrine, Rand Ur: 14 ug/L

## 2016-11-27 DIAGNOSIS — F330 Major depressive disorder, recurrent, mild: Secondary | ICD-10-CM | POA: Diagnosis not present

## 2016-11-28 ENCOUNTER — Encounter: Payer: Self-pay | Admitting: Hematology & Oncology

## 2016-11-28 NOTE — Progress Notes (Unsigned)
Faxed medical records to: [facility] P: [phone] CASE: [id number]        COPY SCANNED

## 2016-11-29 LAB — 5 HIAA, QUANTITATIVE, URINE, 24 HOUR
5-HIAA, URINE: 1.6 mg/L
5-HIAA,QUANT.,24 HR URINE: 2.7 mg/(24.h) (ref 0.0–14.9)

## 2016-12-03 ENCOUNTER — Ambulatory Visit (HOSPITAL_BASED_OUTPATIENT_CLINIC_OR_DEPARTMENT_OTHER): Payer: BLUE CROSS/BLUE SHIELD | Admitting: Hematology & Oncology

## 2016-12-03 DIAGNOSIS — R1033 Periumbilical pain: Secondary | ICD-10-CM

## 2016-12-03 DIAGNOSIS — R109 Unspecified abdominal pain: Secondary | ICD-10-CM | POA: Diagnosis not present

## 2016-12-03 DIAGNOSIS — R945 Abnormal results of liver function studies: Secondary | ICD-10-CM

## 2016-12-03 DIAGNOSIS — R1115 Cyclical vomiting syndrome unrelated to migraine: Secondary | ICD-10-CM

## 2016-12-03 DIAGNOSIS — R634 Abnormal weight loss: Secondary | ICD-10-CM

## 2016-12-03 NOTE — Progress Notes (Signed)
Hematology and Oncology Follow Up Visit  Denise Kerr [id number] 1970/02/28 46 y.o. 12/03/2016   Principle Diagnosis:   Iron deficiency anemia due to malabsorption  Chronic fatigue  Non-insulin-dependent diabetes  Hemochromatosis - Heterozygote for H63D mutation  Current Therapy:    IV iron-last dose given on 06/15/2016     Interim History:  Ms. Askari is back for follow-up. Unfortunately, she just is not getting better. She is not eating well. She has abdominal pain. She has no energy.  We ran a lot of studies on her. The only study that came back positive is that she is heterozygous for hemachromatosis. She has the H63D mutation. I'm not sure how this is tied in to her symptoms.  She definitely is not iron overloaded. Her ferritin is only 55 with a iron saturation of 38%.  We ran monoclonal studies. She does not have a monoclonal spike in her serum.  We ran a gastrin level to make sure she does not have [place] syndrome. She is having a lot of diarrhea.  We checked a TSH on her. This was normal at 1.16.  I don't know if she has chronic fatigue syndrome. I told her that this is some that we just don't deal with in our practice. She would have to see infectious disease for this.  Her metabolic studies all look okay. Her liver function tests are all right.  We ran urine studies looking for pheochromocytoma. Her urine studies all are normal.  Her urine for 5-HIAA was also normal.  I am not sure what else we can do. I just cannot find any obvious hematologic or obvious oncologic problem.  I have recommended to her a bone marrow biopsy. She has refused this. I don't think this is unreasonable as I think the results from a bone marrow biopsy would be very low yield.  I recommended that she get referred to one of the academic [facility] to see if they might be able to figure out what to be wrong with her.   I just feel bad for her. I want her to feel  better.  I know she is on quite a few medications. She has diabetes. I will note the diabetes is causing issues. She is on medications for depression. She is on Synthroid.  One possibility would be to do a CT scan of her pancreas to see if anything might be there that could be causing some issues for her.   Overall, her performance status is ECOG 2-3.  Medications:  Current Outpatient Prescriptions:  .  ALPRAZolam (XANAX) 0.5 MG tablet, Take 0.5 mg by mouth daily as needed for anxiety or sleep. , Disp: , Rfl:  .  atorvastatin (LIPITOR) 10 MG tablet, Take 10 mg by mouth daily at 6 PM. Takes with lisinopril, Disp: , Rfl:  .  buPROPion (WELLBUTRIN XL) 300 MG 24 hr tablet, Take 300 mg by mouth daily. Takes along with 150 mg to equal 450 mg daily, Disp: , Rfl:  .  canagliflozin (INVOKANA) 100 MG TABS tablet, Take 1 tablet (100 mg total) by mouth daily. BEFORE first meal of the day, Disp: 30 tablet, Rfl: 5 .  fluconazole (DIFLUCAN) 150 MG tablet, Take 1 tablet by mouth once, repeat in 72 hours if symptoms persist. (Patient taking differently: as needed. Take 1 tablet by mouth once, repeat in 72 hours if symptoms persist.), Disp: 2 tablet, Rfl: 0 .  glucose blood test strip, Use as instructed. ONE TOUCH ULTRA TEST  STRIPS (Patient taking differently: Test 2-3 times daily ONE TOUCH ULTRA TEST STRIPS), Disp: 100 each, Rfl: PRN .  HYDROcodone-acetaminophen (NORCO/VICODIN) 5-325 MG tablet, Take 1-2 tablets by mouth every 4 (four) hours as needed for moderate pain., Disp: 40 tablet, Rfl: 0 .  lisinopril (PRINIVIL,ZESTRIL) 5 MG tablet, TAKE 1 TABLET DAILY (Patient taking differently: Take 5 mg by mouth every evening. ), Disp: 90 tablet, Rfl: 0 .  metFORMIN (GLUCOPHAGE) 1000 MG tablet, TAKE 1 TABLET TWICE A DAY WITH MEALS, Disp: 180 tablet, Rfl: 0 .  promethazine (PHENERGAN) 25 MG tablet, Take 1 tablet (25 mg total) by mouth every 8 (eight) hours as needed for nausea or vomiting., Disp: 30 tablet, Rfl: 0 .   SYNTHROID 50 MCG tablet, Take 50 mcg by mouth every morning., Disp: , Rfl: 7 .  tretinoin (RETIN-A) 0.025 % cream, APPLY 1 APPLICATION TOPICALLY EVERY NIGHT AT BEDTIME (Patient taking differently: Apply 1 application topically 2 (two) times daily. ), Disp: 45 g, Rfl: 1 .  Vilazodone HCl (VIIBRYD) 40 MG TABS, Take 40 mg by mouth daily., Disp: , Rfl:  .  zaleplon (SONATA) 10 MG capsule, TK 2 CS PO QHS, Disp: , Rfl: 5  Allergies:  Allergies  Allergen Reactions  . Fetzima [Levomilnacipran]     Elevated liver enzymes per MD notes  . Viberzi [Eluxadoline] Other (See Comments)    Nausea and vomiting.  "stopped her system"    Past Medical History, Surgical history, Social history, and Family History were reviewed and updated.  Review of Systems: As stated in the interim history  Physical Exam:  weight is 119 lb 3.2 oz (54.1 kg). Her oral temperature is 98.6 F (37 C). Her blood pressure is 119/76 and her pulse is 101 (abnormal).   Wt Readings from Last 3 Encounters:  12/03/16 119 lb 3.2 oz (54.1 kg)  07/19/16 115 lb 6.1 oz (52.3 kg)  06/21/16 113 lb (51.3 kg)      Well-developed and well-nourished white female in no obvious distress. Head and neck exam shows no ocular or oral lesions. There are no palpable cervical or supraclavicular lymph nodes. Lungs are clear bilaterally. Cardiac exam regular rate and rhythm with no murmurs, rubs or bruits. Abdomen is soft. There may be some slight tenderness in the periumbilical area. No fluid wave is noted. There is no guarding or rebound tenderness. She has no obvious abdominal mass. There is no palpable liver or spleen tip. Her liver edge might be at the right costal margin. I cannot palpate any inguinal adenopathy bilaterally. Axillary exam shows no bilateral axillary adenopathy. Back exam shows no tenderness over the spine, ribs or hips. Extremities shows no clubbing, cyanosis or edema. She has good range of motion of her joints. She has good strength  in upper and lower extremities. She has no venous cords in her legs. Skin exam shows some slightly dry skin. No ecchymoses are noted. There is no rashes with her skin. Neurological exam shows no focal neurological deficits.  Lab Results  Component Value Date   WBC 5.6 11/15/2016   HGB 13.8 11/15/2016   HCT 41.3 11/15/2016   MCV 92 11/15/2016   PLT 194 11/15/2016     Chemistry      Component Value Date/Time   NA 138 11/15/2016 1117   K 3.9 11/15/2016 1117   CL 97 06/07/2016 1356   CO2 26 11/15/2016 1117   BUN 19.3 11/15/2016 1117   CREATININE 0.8 11/15/2016 1117      Component  Value Date/Time   CALCIUM 9.7 11/15/2016 1117   ALKPHOS 75 11/15/2016 1117   AST 18 11/15/2016 1117   ALT 13 11/15/2016 1117   BILITOT 0.50 11/15/2016 1117         Impression and Plan: Ms. Stammer is a 46 year old white female. I still am not sure as to what is causing all of her problems.  I spent about 45 minutes with she and her husband. I just feel her frustration. I know she hates feeling this way. She does not need to live like this. Hopefully, we or somebody can help her out.  We will see about a CT scan. Hopefully, insurance will cover this.  I still think that having her referred to an academic [facility] would be a decent idea to see if they can sort through all of her test results to see what might be going on.  I am not sure we really have to get her back here. Again I cannot find any obvious hematologic or oncologic problem that we can treat.  She does have the hemachromatosis. However, given that she is heterozygous for a minor gene, I would not think that iron overload would cause issues for her. However, this might be where the CT scan can help [place] out.   Volanda Napoleon, MD 10/8/20183:31 PM

## 2016-12-04 DIAGNOSIS — F330 Major depressive disorder, recurrent, mild: Secondary | ICD-10-CM | POA: Diagnosis not present

## 2016-12-11 DIAGNOSIS — F330 Major depressive disorder, recurrent, mild: Secondary | ICD-10-CM | POA: Diagnosis not present

## 2016-12-11 DIAGNOSIS — Z0289 Encounter for other administrative examinations: Secondary | ICD-10-CM

## 2016-12-18 ENCOUNTER — Other Ambulatory Visit: Payer: Self-pay | Admitting: Family

## 2016-12-18 DIAGNOSIS — F330 Major depressive disorder, recurrent, mild: Secondary | ICD-10-CM | POA: Diagnosis not present

## 2016-12-19 ENCOUNTER — Ambulatory Visit (HOSPITAL_COMMUNITY): Admission: RE | Admit: 2016-12-19 | Payer: BLUE CROSS/BLUE SHIELD | Source: Ambulatory Visit

## 2016-12-20 ENCOUNTER — Other Ambulatory Visit: Payer: Self-pay | Admitting: *Deleted

## 2016-12-20 DIAGNOSIS — D500 Iron deficiency anemia secondary to blood loss (chronic): Secondary | ICD-10-CM

## 2016-12-21 ENCOUNTER — Encounter: Payer: Self-pay | Admitting: Hematology & Oncology

## 2016-12-24 ENCOUNTER — Other Ambulatory Visit (HOSPITAL_BASED_OUTPATIENT_CLINIC_OR_DEPARTMENT_OTHER): Payer: BLUE CROSS/BLUE SHIELD

## 2016-12-24 DIAGNOSIS — D509 Iron deficiency anemia, unspecified: Secondary | ICD-10-CM

## 2016-12-24 DIAGNOSIS — D500 Iron deficiency anemia secondary to blood loss (chronic): Secondary | ICD-10-CM

## 2016-12-24 LAB — CMP (CANCER CENTER ONLY)
ALK PHOS: 73 U/L (ref 26–84)
ALT: 18 U/L (ref 10–47)
AST: 22 U/L (ref 11–38)
Albumin: 3.6 g/dL (ref 3.3–5.5)
BUN: 14 mg/dL (ref 7–22)
CO2: 31 mEq/L (ref 18–33)
CREATININE: 1 mg/dL (ref 0.6–1.2)
Calcium: 9.6 mg/dL (ref 8.0–10.3)
Chloride: 101 mEq/L (ref 98–108)
GLUCOSE: 203 mg/dL — AB (ref 73–118)
POTASSIUM: 4.6 meq/L (ref 3.3–4.7)
SODIUM: 143 meq/L (ref 128–145)
TOTAL PROTEIN: 6.9 g/dL (ref 6.4–8.1)
Total Bilirubin: 0.6 mg/dl (ref 0.20–1.60)

## 2016-12-24 LAB — CBC WITH DIFFERENTIAL (CANCER CENTER ONLY)
BASO#: 0 10*3/uL (ref 0.0–0.2)
BASO%: 0.4 % (ref 0.0–2.0)
EOS%: 2.8 % (ref 0.0–7.0)
Eosinophils Absolute: 0.1 10*3/uL (ref 0.0–0.5)
HCT: 41.8 % (ref 34.8–46.6)
HGB: 14 g/dL (ref 11.6–15.9)
LYMPH#: 1.7 10*3/uL (ref 0.9–3.3)
LYMPH%: 33.7 % (ref 14.0–48.0)
MCH: 30.9 pg (ref 26.0–34.0)
MCHC: 33.5 g/dL (ref 32.0–36.0)
MCV: 92 fL (ref 81–101)
MONO#: 0.4 10*3/uL (ref 0.1–0.9)
MONO%: 7.4 % (ref 0.0–13.0)
NEUT#: 2.8 10*3/uL (ref 1.5–6.5)
NEUT%: 55.7 % (ref 39.6–80.0)
PLATELETS: 214 10*3/uL (ref 145–400)
RBC: 4.53 10*6/uL (ref 3.70–5.32)
RDW: 12.7 % (ref 11.1–15.7)
WBC: 5 10*3/uL (ref 3.9–10.0)

## 2016-12-25 DIAGNOSIS — F330 Major depressive disorder, recurrent, mild: Secondary | ICD-10-CM | POA: Diagnosis not present

## 2016-12-26 ENCOUNTER — Ambulatory Visit (HOSPITAL_COMMUNITY): Payer: BLUE CROSS/BLUE SHIELD

## 2016-12-31 ENCOUNTER — Encounter: Payer: Self-pay | Admitting: Hematology & Oncology

## 2017-01-03 ENCOUNTER — Ambulatory Visit (HOSPITAL_COMMUNITY)
Admission: RE | Admit: 2017-01-03 | Discharge: 2017-01-03 | Disposition: A | Discharge status: Home / Self Care | Payer: BLUE CROSS/BLUE SHIELD | Source: Ambulatory Visit | Attending: Hematology & Oncology | Admitting: Hematology & Oncology

## 2017-01-03 DIAGNOSIS — R634 Abnormal weight loss: Secondary | ICD-10-CM | POA: Diagnosis not present

## 2017-01-03 DIAGNOSIS — G43A0 Cyclical vomiting, not intractable: Secondary | ICD-10-CM | POA: Diagnosis not present

## 2017-01-03 DIAGNOSIS — R945 Abnormal results of liver function studies: Secondary | ICD-10-CM | POA: Diagnosis not present

## 2017-01-03 DIAGNOSIS — R160 Hepatomegaly, not elsewhere classified: Secondary | ICD-10-CM | POA: Insufficient documentation

## 2017-01-03 DIAGNOSIS — R1115 Cyclical vomiting syndrome unrelated to migraine: Secondary | ICD-10-CM

## 2017-01-03 DIAGNOSIS — R1033 Periumbilical pain: Secondary | ICD-10-CM

## 2017-01-03 MED ORDER — IOPAMIDOL (ISOVUE-300) INJECTION 61%
INTRAVENOUS | Status: AC
  Administered 2017-01-03: 100 mL via INTRAVENOUS
  Filled 2017-01-03: qty 100

## 2017-01-03 MED ORDER — IOPAMIDOL (ISOVUE-300) INJECTION 61%
30.0000 mL | Freq: Once | INTRAVENOUS | Status: AC | PRN
  Administered 2017-01-03: 30 mL via ORAL

## 2017-01-03 MED ORDER — IOPAMIDOL (ISOVUE-300) INJECTION 61%
100.0000 mL | Freq: Once | INTRAVENOUS | Status: AC | PRN
  Administered 2017-01-03: 100 mL via INTRAVENOUS

## 2017-01-03 MED ORDER — IOPAMIDOL (ISOVUE-300) INJECTION 61%
INTRAVENOUS | Status: AC
  Filled 2017-01-03: qty 30

## 2017-01-03 MED ORDER — IOPAMIDOL (ISOVUE-M 300) INJECTION 61%: 15.0000 mL | Freq: Once | INTRAMUSCULAR | Status: DC | PRN

## 2017-01-07 DIAGNOSIS — Z1211 Encounter for screening for malignant neoplasm of colon: Secondary | ICD-10-CM | POA: Diagnosis not present

## 2017-01-07 DIAGNOSIS — R933 Abnormal findings on diagnostic imaging of other parts of digestive tract: Secondary | ICD-10-CM | POA: Diagnosis not present

## 2017-01-08 DIAGNOSIS — F330 Major depressive disorder, recurrent, mild: Secondary | ICD-10-CM | POA: Diagnosis not present

## 2017-01-10 ENCOUNTER — Telehealth: Payer: Self-pay | Admitting: *Deleted

## 2017-01-10 NOTE — Telephone Encounter (Signed)
Patient wants results from CT scan last week.  Reviewed scan with Dr Marin Olp. He would like patient to have results.  Patient is aware of results. She will speak to her GI MD regarding the finding in the liver.

## 2017-01-15 DIAGNOSIS — F330 Major depressive disorder, recurrent, mild: Secondary | ICD-10-CM | POA: Diagnosis not present

## 2017-01-21 DIAGNOSIS — F411 Generalized anxiety disorder: Secondary | ICD-10-CM | POA: Diagnosis not present

## 2017-01-21 DIAGNOSIS — F3342 Major depressive disorder, recurrent, in full remission: Secondary | ICD-10-CM | POA: Diagnosis not present

## 2017-01-22 DIAGNOSIS — F330 Major depressive disorder, recurrent, mild: Secondary | ICD-10-CM | POA: Diagnosis not present

## 2017-01-25 ENCOUNTER — Encounter: Payer: Self-pay | Admitting: Hematology & Oncology

## 2017-01-26 DIAGNOSIS — Z1211 Encounter for screening for malignant neoplasm of colon: Secondary | ICD-10-CM | POA: Diagnosis not present

## 2017-01-29 DIAGNOSIS — F330 Major depressive disorder, recurrent, mild: Secondary | ICD-10-CM | POA: Diagnosis not present

## 2017-02-05 DIAGNOSIS — F330 Major depressive disorder, recurrent, mild: Secondary | ICD-10-CM | POA: Diagnosis not present

## 2017-02-11 DIAGNOSIS — F330 Major depressive disorder, recurrent, mild: Secondary | ICD-10-CM | POA: Diagnosis not present

## 2017-02-21 DIAGNOSIS — F330 Major depressive disorder, recurrent, mild: Secondary | ICD-10-CM | POA: Diagnosis not present

## 2017-02-27 DIAGNOSIS — F330 Major depressive disorder, recurrent, mild: Secondary | ICD-10-CM | POA: Diagnosis not present

## 2017-03-05 DIAGNOSIS — F330 Major depressive disorder, recurrent, mild: Secondary | ICD-10-CM | POA: Diagnosis not present

## 2017-03-11 DIAGNOSIS — F330 Major depressive disorder, recurrent, mild: Secondary | ICD-10-CM | POA: Diagnosis not present

## 2017-03-12 DIAGNOSIS — Z7984 Long term (current) use of oral hypoglycemic drugs: Secondary | ICD-10-CM | POA: Diagnosis not present

## 2017-03-12 DIAGNOSIS — E119 Type 2 diabetes mellitus without complications: Secondary | ICD-10-CM | POA: Diagnosis not present

## 2017-03-12 DIAGNOSIS — K529 Noninfective gastroenteritis and colitis, unspecified: Secondary | ICD-10-CM | POA: Diagnosis not present

## 2017-03-12 DIAGNOSIS — F330 Major depressive disorder, recurrent, mild: Secondary | ICD-10-CM | POA: Diagnosis not present

## 2017-03-15 DIAGNOSIS — M6289 Other specified disorders of muscle: Secondary | ICD-10-CM | POA: Insufficient documentation

## 2017-03-19 DIAGNOSIS — E119 Type 2 diabetes mellitus without complications: Secondary | ICD-10-CM | POA: Diagnosis not present

## 2017-03-19 DIAGNOSIS — Z6821 Body mass index (BMI) 21.0-21.9, adult: Secondary | ICD-10-CM | POA: Diagnosis not present

## 2017-03-19 DIAGNOSIS — F330 Major depressive disorder, recurrent, mild: Secondary | ICD-10-CM | POA: Diagnosis not present

## 2017-03-19 DIAGNOSIS — K529 Noninfective gastroenteritis and colitis, unspecified: Secondary | ICD-10-CM | POA: Diagnosis not present

## 2017-03-19 DIAGNOSIS — R160 Hepatomegaly, not elsewhere classified: Secondary | ICD-10-CM | POA: Diagnosis not present

## 2017-03-25 DIAGNOSIS — F330 Major depressive disorder, recurrent, mild: Secondary | ICD-10-CM | POA: Diagnosis not present

## 2017-04-02 DIAGNOSIS — F330 Major depressive disorder, recurrent, mild: Secondary | ICD-10-CM | POA: Diagnosis not present

## 2017-04-09 DIAGNOSIS — F330 Major depressive disorder, recurrent, mild: Secondary | ICD-10-CM | POA: Diagnosis not present

## 2017-04-09 DIAGNOSIS — R634 Abnormal weight loss: Secondary | ICD-10-CM | POA: Diagnosis not present

## 2017-04-09 DIAGNOSIS — L659 Nonscarring hair loss, unspecified: Secondary | ICD-10-CM | POA: Diagnosis not present

## 2017-04-09 DIAGNOSIS — R5383 Other fatigue: Secondary | ICD-10-CM | POA: Diagnosis not present

## 2017-04-09 DIAGNOSIS — R197 Diarrhea, unspecified: Secondary | ICD-10-CM | POA: Diagnosis not present

## 2017-04-09 DIAGNOSIS — K529 Noninfective gastroenteritis and colitis, unspecified: Secondary | ICD-10-CM | POA: Insufficient documentation

## 2017-04-16 DIAGNOSIS — F330 Major depressive disorder, recurrent, mild: Secondary | ICD-10-CM | POA: Diagnosis not present

## 2017-04-19 DIAGNOSIS — K529 Noninfective gastroenteritis and colitis, unspecified: Secondary | ICD-10-CM | POA: Diagnosis not present

## 2017-04-19 DIAGNOSIS — Z7984 Long term (current) use of oral hypoglycemic drugs: Secondary | ICD-10-CM | POA: Diagnosis not present

## 2017-04-19 DIAGNOSIS — E119 Type 2 diabetes mellitus without complications: Secondary | ICD-10-CM | POA: Diagnosis not present

## 2017-04-22 DIAGNOSIS — K529 Noninfective gastroenteritis and colitis, unspecified: Secondary | ICD-10-CM | POA: Diagnosis not present

## 2017-04-23 DIAGNOSIS — A049 Bacterial intestinal infection, unspecified: Secondary | ICD-10-CM | POA: Diagnosis not present

## 2017-04-23 DIAGNOSIS — F330 Major depressive disorder, recurrent, mild: Secondary | ICD-10-CM | POA: Diagnosis not present

## 2017-04-23 DIAGNOSIS — B9689 Other specified bacterial agents as the cause of diseases classified elsewhere: Secondary | ICD-10-CM | POA: Diagnosis not present

## 2017-04-23 DIAGNOSIS — K529 Noninfective gastroenteritis and colitis, unspecified: Secondary | ICD-10-CM | POA: Diagnosis not present

## 2017-04-23 DIAGNOSIS — K639 Disease of intestine, unspecified: Secondary | ICD-10-CM | POA: Diagnosis not present

## 2017-04-30 DIAGNOSIS — F330 Major depressive disorder, recurrent, mild: Secondary | ICD-10-CM | POA: Diagnosis not present

## 2017-05-02 DIAGNOSIS — D509 Iron deficiency anemia, unspecified: Secondary | ICD-10-CM | POA: Diagnosis not present

## 2017-05-02 DIAGNOSIS — K6389 Other specified diseases of intestine: Secondary | ICD-10-CM | POA: Diagnosis not present

## 2017-05-02 DIAGNOSIS — K529 Noninfective gastroenteritis and colitis, unspecified: Secondary | ICD-10-CM | POA: Diagnosis not present

## 2017-05-02 DIAGNOSIS — Z9049 Acquired absence of other specified parts of digestive tract: Secondary | ICD-10-CM | POA: Diagnosis not present

## 2017-05-07 DIAGNOSIS — F330 Major depressive disorder, recurrent, mild: Secondary | ICD-10-CM | POA: Diagnosis not present

## 2017-05-14 ENCOUNTER — Other Ambulatory Visit: Payer: Self-pay | Admitting: Internal Medicine

## 2017-05-14 DIAGNOSIS — Z139 Encounter for screening, unspecified: Secondary | ICD-10-CM

## 2017-05-14 DIAGNOSIS — F330 Major depressive disorder, recurrent, mild: Secondary | ICD-10-CM | POA: Diagnosis not present

## 2017-05-15 ENCOUNTER — Ambulatory Visit
Admission: RE | Admit: 2017-05-15 | Discharge: 2017-05-15 | Disposition: A | Discharge status: Home / Self Care | Payer: BLUE CROSS/BLUE SHIELD | Source: Ambulatory Visit | Attending: Internal Medicine | Admitting: Internal Medicine

## 2017-05-15 DIAGNOSIS — Z139 Encounter for screening, unspecified: Secondary | ICD-10-CM

## 2017-05-15 DIAGNOSIS — Z1231 Encounter for screening mammogram for malignant neoplasm of breast: Secondary | ICD-10-CM | POA: Diagnosis not present

## 2017-05-21 DIAGNOSIS — F330 Major depressive disorder, recurrent, mild: Secondary | ICD-10-CM | POA: Diagnosis not present

## 2017-05-27 DIAGNOSIS — H9313 Tinnitus, bilateral: Secondary | ICD-10-CM | POA: Diagnosis not present

## 2017-05-27 DIAGNOSIS — R4189 Other symptoms and signs involving cognitive functions and awareness: Secondary | ICD-10-CM | POA: Diagnosis not present

## 2017-05-27 DIAGNOSIS — R209 Unspecified disturbances of skin sensation: Secondary | ICD-10-CM | POA: Diagnosis not present

## 2017-05-28 DIAGNOSIS — F330 Major depressive disorder, recurrent, mild: Secondary | ICD-10-CM | POA: Diagnosis not present

## 2017-05-29 ENCOUNTER — Other Ambulatory Visit: Payer: Self-pay | Admitting: Registered Nurse

## 2017-05-29 DIAGNOSIS — R4189 Other symptoms and signs involving cognitive functions and awareness: Secondary | ICD-10-CM

## 2017-05-29 DIAGNOSIS — H9313 Tinnitus, bilateral: Secondary | ICD-10-CM

## 2017-06-02 ENCOUNTER — Ambulatory Visit
Admission: RE | Admit: 2017-06-02 | Discharge: 2017-06-02 | Disposition: A | Discharge status: Home / Self Care | Payer: BLUE CROSS/BLUE SHIELD | Source: Ambulatory Visit | Attending: Registered Nurse | Admitting: Registered Nurse

## 2017-06-02 DIAGNOSIS — R4189 Other symptoms and signs involving cognitive functions and awareness: Secondary | ICD-10-CM

## 2017-06-02 DIAGNOSIS — R413 Other amnesia: Secondary | ICD-10-CM | POA: Diagnosis not present

## 2017-06-02 DIAGNOSIS — H9313 Tinnitus, bilateral: Secondary | ICD-10-CM

## 2017-06-02 MED ORDER — GADOBENATE DIMEGLUMINE 529 MG/ML IV SOLN
10.0000 mL | Freq: Once | INTRAVENOUS | Status: AC | PRN
  Administered 2017-06-02: 10 mL via INTRAVENOUS

## 2017-06-04 DIAGNOSIS — F330 Major depressive disorder, recurrent, mild: Secondary | ICD-10-CM | POA: Diagnosis not present

## 2017-06-05 ENCOUNTER — Other Ambulatory Visit: Payer: Self-pay | Admitting: Internal Medicine

## 2017-06-05 DIAGNOSIS — R9089 Other abnormal findings on diagnostic imaging of central nervous system: Secondary | ICD-10-CM

## 2017-06-06 DIAGNOSIS — K592 Neurogenic bowel, not elsewhere classified: Secondary | ICD-10-CM | POA: Diagnosis not present

## 2017-06-06 DIAGNOSIS — K558 Other vascular disorders of intestine: Secondary | ICD-10-CM | POA: Diagnosis not present

## 2017-06-06 DIAGNOSIS — K529 Noninfective gastroenteritis and colitis, unspecified: Secondary | ICD-10-CM | POA: Diagnosis not present

## 2017-06-06 DIAGNOSIS — D509 Iron deficiency anemia, unspecified: Secondary | ICD-10-CM | POA: Diagnosis not present

## 2017-06-07 DIAGNOSIS — Q2733 Arteriovenous malformation of digestive system vessel: Secondary | ICD-10-CM | POA: Diagnosis not present

## 2017-06-07 DIAGNOSIS — D509 Iron deficiency anemia, unspecified: Secondary | ICD-10-CM | POA: Diagnosis not present

## 2017-06-07 DIAGNOSIS — R197 Diarrhea, unspecified: Secondary | ICD-10-CM | POA: Diagnosis not present

## 2017-06-11 DIAGNOSIS — F330 Major depressive disorder, recurrent, mild: Secondary | ICD-10-CM | POA: Diagnosis not present

## 2017-06-13 ENCOUNTER — Ambulatory Visit
Admission: RE | Admit: 2017-06-13 | Discharge: 2017-06-13 | Disposition: A | Discharge status: Home / Self Care | Payer: BLUE CROSS/BLUE SHIELD | Source: Ambulatory Visit | Attending: Internal Medicine | Admitting: Internal Medicine

## 2017-06-13 DIAGNOSIS — R200 Anesthesia of skin: Secondary | ICD-10-CM | POA: Diagnosis not present

## 2017-06-13 DIAGNOSIS — R9089 Other abnormal findings on diagnostic imaging of central nervous system: Secondary | ICD-10-CM

## 2017-06-18 DIAGNOSIS — F330 Major depressive disorder, recurrent, mild: Secondary | ICD-10-CM | POA: Diagnosis not present

## 2017-06-25 DIAGNOSIS — F330 Major depressive disorder, recurrent, mild: Secondary | ICD-10-CM | POA: Diagnosis not present

## 2017-07-02 DIAGNOSIS — F330 Major depressive disorder, recurrent, mild: Secondary | ICD-10-CM | POA: Diagnosis not present

## 2017-07-03 ENCOUNTER — Other Ambulatory Visit: Payer: Self-pay | Admitting: Internal Medicine

## 2017-07-05 ENCOUNTER — Other Ambulatory Visit: Payer: Self-pay | Admitting: Internal Medicine

## 2017-07-05 DIAGNOSIS — R160 Hepatomegaly, not elsewhere classified: Secondary | ICD-10-CM

## 2017-07-09 DIAGNOSIS — K529 Noninfective gastroenteritis and colitis, unspecified: Secondary | ICD-10-CM | POA: Diagnosis not present

## 2017-07-09 DIAGNOSIS — K6389 Other specified diseases of intestine: Secondary | ICD-10-CM | POA: Diagnosis not present

## 2017-07-11 ENCOUNTER — Ambulatory Visit
Admission: RE | Admit: 2017-07-11 | Discharge: 2017-07-11 | Disposition: A | Discharge status: Home / Self Care | Payer: BLUE CROSS/BLUE SHIELD | Source: Ambulatory Visit | Attending: Internal Medicine | Admitting: Internal Medicine

## 2017-07-11 DIAGNOSIS — K7689 Other specified diseases of liver: Secondary | ICD-10-CM | POA: Diagnosis not present

## 2017-07-11 DIAGNOSIS — R160 Hepatomegaly, not elsewhere classified: Secondary | ICD-10-CM

## 2017-07-11 MED ORDER — GADOXETATE DISODIUM 0.25 MMOL/ML IV SOLN
5.0000 mL | Freq: Once | INTRAVENOUS | Status: AC | PRN
  Administered 2017-07-11: 5 mL via INTRAVENOUS

## 2017-07-16 DIAGNOSIS — F330 Major depressive disorder, recurrent, mild: Secondary | ICD-10-CM | POA: Diagnosis not present

## 2017-07-18 DIAGNOSIS — F3342 Major depressive disorder, recurrent, in full remission: Secondary | ICD-10-CM | POA: Diagnosis not present

## 2017-07-18 DIAGNOSIS — F411 Generalized anxiety disorder: Secondary | ICD-10-CM | POA: Diagnosis not present

## 2017-07-23 DIAGNOSIS — E038 Other specified hypothyroidism: Secondary | ICD-10-CM | POA: Diagnosis not present

## 2017-07-23 DIAGNOSIS — E119 Type 2 diabetes mellitus without complications: Secondary | ICD-10-CM | POA: Diagnosis not present

## 2017-07-23 DIAGNOSIS — F330 Major depressive disorder, recurrent, mild: Secondary | ICD-10-CM | POA: Diagnosis not present

## 2017-07-23 DIAGNOSIS — Z0000 Encounter for general adult medical examination without abnormal findings: Secondary | ICD-10-CM | POA: Diagnosis not present

## 2017-07-23 DIAGNOSIS — R82998 Other abnormal findings in urine: Secondary | ICD-10-CM | POA: Diagnosis not present

## 2017-07-30 DIAGNOSIS — F330 Major depressive disorder, recurrent, mild: Secondary | ICD-10-CM | POA: Diagnosis not present

## 2017-07-31 DIAGNOSIS — Z1389 Encounter for screening for other disorder: Secondary | ICD-10-CM | POA: Diagnosis not present

## 2017-07-31 DIAGNOSIS — E119 Type 2 diabetes mellitus without complications: Secondary | ICD-10-CM | POA: Diagnosis not present

## 2017-07-31 DIAGNOSIS — E038 Other specified hypothyroidism: Secondary | ICD-10-CM | POA: Diagnosis not present

## 2017-07-31 DIAGNOSIS — Z23 Encounter for immunization: Secondary | ICD-10-CM | POA: Diagnosis not present

## 2017-07-31 DIAGNOSIS — G43909 Migraine, unspecified, not intractable, without status migrainosus: Secondary | ICD-10-CM | POA: Diagnosis not present

## 2017-07-31 DIAGNOSIS — E7849 Other hyperlipidemia: Secondary | ICD-10-CM | POA: Diagnosis not present

## 2017-07-31 DIAGNOSIS — Z0000 Encounter for general adult medical examination without abnormal findings: Secondary | ICD-10-CM | POA: Diagnosis not present

## 2017-07-31 DIAGNOSIS — E611 Iron deficiency: Secondary | ICD-10-CM | POA: Diagnosis not present

## 2017-08-06 DIAGNOSIS — F330 Major depressive disorder, recurrent, mild: Secondary | ICD-10-CM | POA: Diagnosis not present

## 2017-08-13 DIAGNOSIS — D509 Iron deficiency anemia, unspecified: Secondary | ICD-10-CM | POA: Diagnosis not present

## 2017-08-13 DIAGNOSIS — K529 Noninfective gastroenteritis and colitis, unspecified: Secondary | ICD-10-CM | POA: Diagnosis not present

## 2017-08-14 ENCOUNTER — Telehealth: Payer: Self-pay | Admitting: Hematology & Oncology

## 2017-08-14 DIAGNOSIS — F330 Major depressive disorder, recurrent, mild: Secondary | ICD-10-CM | POA: Diagnosis not present

## 2017-08-14 NOTE — Telephone Encounter (Signed)
Faxed medical records to: [facility] F: [phone] P: [phone]      COPY SCANNED

## 2017-08-20 DIAGNOSIS — F330 Major depressive disorder, recurrent, mild: Secondary | ICD-10-CM | POA: Diagnosis not present

## 2017-08-27 DIAGNOSIS — F330 Major depressive disorder, recurrent, mild: Secondary | ICD-10-CM | POA: Diagnosis not present

## 2017-09-03 DIAGNOSIS — F330 Major depressive disorder, recurrent, mild: Secondary | ICD-10-CM | POA: Diagnosis not present

## 2017-09-20 ENCOUNTER — Telehealth: Payer: Self-pay | Admitting: Hematology & Oncology

## 2017-09-20 NOTE — Telephone Encounter (Signed)
Faxed medical records to: [facility] P: [phone] CASE: [id number]        COPY SCANNED

## 2017-09-24 DIAGNOSIS — F330 Major depressive disorder, recurrent, mild: Secondary | ICD-10-CM | POA: Diagnosis not present

## 2017-10-01 DIAGNOSIS — F330 Major depressive disorder, recurrent, mild: Secondary | ICD-10-CM | POA: Diagnosis not present

## 2017-10-03 ENCOUNTER — Telehealth: Payer: Self-pay | Admitting: Obstetrics & Gynecology

## 2017-10-03 NOTE — Telephone Encounter (Signed)
Spoke with patient. Reports pain with intercourse last night, first time this has occurred and is concerned. Pain is internal. Same partner x2 years. No bleeding or pain after, using adequate lubrication. Denies vaginal d/c, odor or urinary complaints.   LMP June 2018. No contraceptive.   Reports long Hx of anemia and "intestinal issues". Seeing Dr. 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   Seen as new pt on 05/15/16, [name] 06/21/16. Next AEX 10/29/17 with Dr. Sabra Heck.   OV scheduled for 8/13 at 11:15am with Dr. Sabra Heck. Patient declined earlier appt offered.   Routing to provider for final review. Patient is agreeable to disposition. Will close encounter.

## 2017-10-03 NOTE — Telephone Encounter (Signed)
Patient experienced painful intercourse and would like to come in and discuss since this is something new for her.
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
## 2017-10-08 ENCOUNTER — Encounter: Payer: Self-pay | Admitting: Obstetrics & Gynecology

## 2017-10-08 ENCOUNTER — Ambulatory Visit: Payer: BLUE CROSS/BLUE SHIELD | Admitting: Obstetrics & Gynecology

## 2017-10-08 VITALS — BP 110/78 | HR 106 | Resp 16 | Ht 63.75 in | Wt 120.6 lb

## 2017-10-08 DIAGNOSIS — N912 Amenorrhea, unspecified: Secondary | ICD-10-CM

## 2017-10-08 DIAGNOSIS — N941 Unspecified dyspareunia: Secondary | ICD-10-CM

## 2017-10-08 DIAGNOSIS — L905 Scar conditions and fibrosis of skin: Secondary | ICD-10-CM | POA: Diagnosis not present

## 2017-10-08 DIAGNOSIS — R3915 Urgency of urination: Secondary | ICD-10-CM

## 2017-10-08 DIAGNOSIS — F330 Major depressive disorder, recurrent, mild: Secondary | ICD-10-CM | POA: Diagnosis not present

## 2017-10-08 LAB — POCT URINALYSIS DIPSTICK
Bilirubin, UA: NEGATIVE
GLUCOSE UA: POSITIVE — AB
KETONES UA: NEGATIVE
Leukocytes, UA: NEGATIVE
NITRITE UA: NEGATIVE
PROTEIN UA: NEGATIVE
Urobilinogen, UA: 0.2 E.U./dL
pH, UA: 5 (ref 5.0–8.0)
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
## 2017-10-09 LAB — FOLLICLE STIMULATING HORMONE: FSH: 148 m[IU]/mL

## 2017-10-09 LAB — ESTRADIOL

## 2017-10-10 ENCOUNTER — Telehealth: Payer: Self-pay | Admitting: Obstetrics & Gynecology

## 2017-10-10 DIAGNOSIS — R197 Diarrhea, unspecified: Secondary | ICD-10-CM

## 2017-10-10 NOTE — Telephone Encounter (Signed)
Patient has questions about her [facility] lab results that were resulted in [place]. Pleasd advise.

## 2017-10-10 NOTE — Telephone Encounter (Signed)
Call to patient. Patient states she reviewed [facility] result in MyChart and concerned as to why the level is out of the reference range. Patient states, "the reference range is there for a reason and my level being out of range is concerning." RN advised level consistent with menopause. Patient states she understands, but states she and Dr. Sabra Heck had discussed doing additional lab work and wondered if that needed to be done at this time? RN advised would review with covering provider and return call with recommendations. Patient states she has aex scheduled 10-29-17 with Dr. Sabra Heck and is okay to discuss further at that appointment. Patient states she would also like to proceed with referral to [facility] GI that she and Dr. Sabra Heck had discussed. RN advised would update Dr. Sabra Heck.   Routing to covering provider for review.  Cc Dr. Sabra Heck

## 2017-10-11 NOTE — Telephone Encounter (Signed)
Her [place] level is not worrisome, c/w menopause. She should discuss any additional lab work with Dr Sabra Heck at her next visit.   Please set up referral to [facility] as requested.

## 2017-10-14 DIAGNOSIS — D509 Iron deficiency anemia, unspecified: Secondary | ICD-10-CM | POA: Diagnosis not present

## 2017-10-14 DIAGNOSIS — K529 Noninfective gastroenteritis and colitis, unspecified: Secondary | ICD-10-CM | POA: Diagnosis not present

## 2017-10-14 NOTE — Telephone Encounter (Signed)
New patient referral packet faxed to [facility] [place]. Fax #: [phone].

## 2017-10-14 NOTE — Telephone Encounter (Signed)
Call to patient. Message given to patient as seen below from Dr. Talbert Nan and patient verbalized understanding. RN advised would set up referral to [facility] GI. Patient agreeable and aware would be contacted to schedule appointment.

## 2017-10-15 DIAGNOSIS — F330 Major depressive disorder, recurrent, mild: Secondary | ICD-10-CM | POA: Diagnosis not present

## 2017-10-21 NOTE — Telephone Encounter (Signed)
Spoke with patient. Patient has referral to [facility], is awaiting return call for scheduling. Patient wants Dr. Sabra Heck to be aware she was seen by Dr. Christoper Fabian, [facility], on 10/14/17 and started on a new medication. Report available in Care Everywhere. Patient will further discuss at [place] on 9/3.   Routing to E. Marcelline Deist, Therapist, sports.   Cc: Dr. Sabra Heck

## 2017-10-22 DIAGNOSIS — F330 Major depressive disorder, recurrent, mild: Secondary | ICD-10-CM | POA: Diagnosis not present

## 2017-10-23 NOTE — Telephone Encounter (Signed)
Call placed to [facility] to check status of referral [phone]) I was advised by Cassandra, patient has been placed on a "wait List", adding they have no appointments available at this time. Cassandra also advises they will call the patient when the "new schedule is available.   I have also placed a call to the patient, to verify if she is aware of the above information. Left voicemail message requesting a return call from the patient.   cc: Dr Sabra Heck

## 2017-10-24 DIAGNOSIS — E119 Type 2 diabetes mellitus without complications: Secondary | ICD-10-CM | POA: Diagnosis not present

## 2017-10-27 DIAGNOSIS — K5520 Angiodysplasia of colon without hemorrhage: Secondary | ICD-10-CM | POA: Insufficient documentation

## 2017-10-29 ENCOUNTER — Other Ambulatory Visit: Payer: Self-pay

## 2017-10-29 ENCOUNTER — Encounter: Payer: Self-pay | Admitting: Obstetrics & Gynecology

## 2017-10-29 ENCOUNTER — Ambulatory Visit (INDEPENDENT_AMBULATORY_CARE_PROVIDER_SITE_OTHER): Payer: BLUE CROSS/BLUE SHIELD | Admitting: Obstetrics & Gynecology

## 2017-10-29 ENCOUNTER — Other Ambulatory Visit (HOSPITAL_COMMUNITY)
Admission: RE | Admit: 2017-10-29 | Discharge: 2017-10-29 | Disposition: A | Discharge status: Home / Self Care | Payer: BLUE CROSS/BLUE SHIELD | Source: Ambulatory Visit | Attending: Obstetrics & Gynecology | Admitting: Obstetrics & Gynecology

## 2017-10-29 VITALS — BP 100/66 | HR 92 | Resp 16 | Ht 64.0 in | Wt 122.0 lb

## 2017-10-29 DIAGNOSIS — Z124 Encounter for screening for malignant neoplasm of cervix: Secondary | ICD-10-CM

## 2017-10-29 DIAGNOSIS — F330 Major depressive disorder, recurrent, mild: Secondary | ICD-10-CM | POA: Diagnosis not present

## 2017-10-29 DIAGNOSIS — Z01419 Encounter for gynecological examination (general) (routine) without abnormal findings: Secondary | ICD-10-CM | POA: Diagnosis not present

## 2017-10-29 MED ORDER — ESTROGENS, CONJUGATED 0.625 MG/GM VA CREA
TOPICAL_CREAM | VAGINAL | 6 refills | Status: DC
Start: 2017-10-29 — End: 2018-07-24
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
## 2017-10-30 ENCOUNTER — Encounter: Payer: Self-pay | Admitting: Obstetrics & Gynecology

## 2017-10-30 ENCOUNTER — Telehealth: Payer: Self-pay | Admitting: Obstetrics & Gynecology

## 2017-10-30 NOTE — Telephone Encounter (Signed)
Patient sent the following correspondence through [place]. Routing to triage to assist patient with request.  Hello Dr. Sabra Heck,    Thank you so much for your time and compassion during my visit yesterday. Your kindness in my medical 'team' is unmatched. My apologies to all of your later patients for their wait time.  I would like to request the vitamin E suppository if it is possible to use on days not using topical estrogen cream.  I also made a call to [facility] to check on the new schedule for GI. I must have misunderstood the information from your office. New GI schedules do not come out until October and those appts. will be for January, February and March of 2020. I am unclear if I would be considered for appointments for early 2020 as well. However, I am on a wait list. And while speaking with the sched/calendar coordinator, she intimated no one cancels their appts. Is there possible to get clarity from your office? Also, do you have anyone to contact there [facility]) to expedite this process? I know I am asking a lot but I am in a position where I can lose everything.  Thank you,  [facility]

## 2017-10-30 NOTE — Telephone Encounter (Signed)
Routing to Dr. Sabra Heck as patient has requested a prescription for Vaginal Vitamin E suppository, unsure if discussed at recent visit.   Routing message to Magdalene Patricia to assist with [facility] referral.

## 2017-10-31 LAB — CYTOLOGY - PAP
Diagnosis: NEGATIVE
HPV: NOT DETECTED

## 2017-10-31 NOTE — Telephone Encounter (Signed)
Dr. Sabra Heck and Olivia Mackie,  I have called and spoke with Cassandra at [facility].I was advised the patient has been placed on a "wait List. They have no appointments available at this time. The new schedule will be open the end of September and they are scheduling out into January 2020. Would you like to refer her to another place if so where?  [facility]

## 2017-10-31 NOTE — Telephone Encounter (Signed)
She really doesn't need Vit E vaginal suppositories and vaginal estrogen cream.  It's basically too much and a waste of money.  The estrogen should work quite well twice weekly as prescribed.    It would be helpful to forward this to [facility] to have her call [facility].  We've been able to get patients in so much faster than six months from now.  I'm going to include Rosa on this message.  Thanks.

## 2017-10-31 NOTE — Telephone Encounter (Signed)
Called patient and advised of appointment at [facility] with Dr. Mellody Drown on 11/04/17 at 1:20 in [place].  Pt very appreciative. There was a cancellation and she was able to be worked in.   Pt given messages from Dr. Sabra Heck and verbalized understanding on need to not use Vit E with Vaginal estrogen.  Encounter to Dr. Sabra Heck and Magdalene Patricia to close referral.  Encounter closed.

## 2017-11-04 DIAGNOSIS — K6389 Other specified diseases of intestine: Secondary | ICD-10-CM | POA: Diagnosis not present

## 2017-11-04 DIAGNOSIS — Z6822 Body mass index (BMI) 22.0-22.9, adult: Secondary | ICD-10-CM | POA: Diagnosis not present

## 2017-11-05 DIAGNOSIS — F330 Major depressive disorder, recurrent, mild: Secondary | ICD-10-CM | POA: Diagnosis not present

## 2017-11-12 DIAGNOSIS — M62838 Other muscle spasm: Secondary | ICD-10-CM | POA: Diagnosis not present

## 2017-11-12 DIAGNOSIS — R102 Pelvic and perineal pain: Secondary | ICD-10-CM | POA: Diagnosis not present

## 2017-11-12 DIAGNOSIS — L910 Hypertrophic scar: Secondary | ICD-10-CM | POA: Diagnosis not present

## 2017-11-12 DIAGNOSIS — M6281 Muscle weakness (generalized): Secondary | ICD-10-CM | POA: Diagnosis not present

## 2017-11-19 DIAGNOSIS — F330 Major depressive disorder, recurrent, mild: Secondary | ICD-10-CM | POA: Diagnosis not present

## 2017-11-20 DIAGNOSIS — R3911 Hesitancy of micturition: Secondary | ICD-10-CM | POA: Diagnosis not present

## 2017-11-20 DIAGNOSIS — R102 Pelvic and perineal pain: Secondary | ICD-10-CM | POA: Diagnosis not present

## 2017-11-20 DIAGNOSIS — M62838 Other muscle spasm: Secondary | ICD-10-CM | POA: Diagnosis not present

## 2017-11-20 DIAGNOSIS — M6281 Muscle weakness (generalized): Secondary | ICD-10-CM | POA: Diagnosis not present

## 2017-11-26 DIAGNOSIS — F330 Major depressive disorder, recurrent, mild: Secondary | ICD-10-CM | POA: Diagnosis not present

## 2017-12-03 DIAGNOSIS — F330 Major depressive disorder, recurrent, mild: Secondary | ICD-10-CM | POA: Diagnosis not present

## 2017-12-04 DIAGNOSIS — N941 Unspecified dyspareunia: Secondary | ICD-10-CM | POA: Diagnosis not present

## 2017-12-04 DIAGNOSIS — M6281 Muscle weakness (generalized): Secondary | ICD-10-CM | POA: Diagnosis not present

## 2017-12-04 DIAGNOSIS — R102 Pelvic and perineal pain: Secondary | ICD-10-CM | POA: Diagnosis not present

## 2017-12-04 DIAGNOSIS — M62838 Other muscle spasm: Secondary | ICD-10-CM | POA: Diagnosis not present

## 2017-12-10 DIAGNOSIS — M6281 Muscle weakness (generalized): Secondary | ICD-10-CM | POA: Diagnosis not present

## 2017-12-10 DIAGNOSIS — R102 Pelvic and perineal pain: Secondary | ICD-10-CM | POA: Diagnosis not present

## 2017-12-10 DIAGNOSIS — F330 Major depressive disorder, recurrent, mild: Secondary | ICD-10-CM | POA: Diagnosis not present

## 2017-12-10 DIAGNOSIS — M62838 Other muscle spasm: Secondary | ICD-10-CM | POA: Diagnosis not present

## 2017-12-10 DIAGNOSIS — L910 Hypertrophic scar: Secondary | ICD-10-CM | POA: Diagnosis not present

## 2017-12-17 DIAGNOSIS — F330 Major depressive disorder, recurrent, mild: Secondary | ICD-10-CM | POA: Diagnosis not present

## 2017-12-17 DIAGNOSIS — L219 Seborrheic dermatitis, unspecified: Secondary | ICD-10-CM | POA: Insufficient documentation

## 2017-12-18 DIAGNOSIS — N941 Unspecified dyspareunia: Secondary | ICD-10-CM | POA: Diagnosis not present

## 2017-12-18 DIAGNOSIS — M6281 Muscle weakness (generalized): Secondary | ICD-10-CM | POA: Diagnosis not present

## 2017-12-18 DIAGNOSIS — M6289 Other specified disorders of muscle: Secondary | ICD-10-CM | POA: Diagnosis not present

## 2017-12-18 DIAGNOSIS — M62838 Other muscle spasm: Secondary | ICD-10-CM | POA: Diagnosis not present

## 2017-12-24 DIAGNOSIS — F330 Major depressive disorder, recurrent, mild: Secondary | ICD-10-CM | POA: Diagnosis not present

## 2017-12-31 DIAGNOSIS — F330 Major depressive disorder, recurrent, mild: Secondary | ICD-10-CM | POA: Diagnosis not present

## 2017-12-31 DIAGNOSIS — M6289 Other specified disorders of muscle: Secondary | ICD-10-CM | POA: Diagnosis not present

## 2017-12-31 DIAGNOSIS — N941 Unspecified dyspareunia: Secondary | ICD-10-CM | POA: Diagnosis not present

## 2017-12-31 DIAGNOSIS — R102 Pelvic and perineal pain: Secondary | ICD-10-CM | POA: Diagnosis not present

## 2017-12-31 DIAGNOSIS — M62838 Other muscle spasm: Secondary | ICD-10-CM | POA: Diagnosis not present

## 2018-01-01 DIAGNOSIS — L309 Dermatitis, unspecified: Secondary | ICD-10-CM | POA: Diagnosis not present

## 2018-01-02 ENCOUNTER — Telehealth: Payer: Self-pay | Admitting: Obstetrics & Gynecology

## 2018-01-02 NOTE — Telephone Encounter (Signed)
Patient called and scheduled an appointment tomorrow, 01/03/18, at 4:00 PM with Dr. Sabra Heck for vaginal irritation and itching. She'd like to know if the nurse has some recommendations for relief until her appointment.

## 2018-01-02 NOTE — Telephone Encounter (Signed)
Call to patient. Patient states that since last Sunday she has been "very itchy." Patient states she does not feel like this is a yeast infection. States the labia are really dry and she feels like that is what is contributing to the itchiness. Lasted used premarin cream this am. States she has washed the area well and patted dry. Also trying to avoid tight fitting clothing. RN advised could use coconut oil or hydrocortisone cream and apply light layer externally. RN advised not to place anything vaginally as it could interfere with possible testing Dr. Sabra Heck would need to do tomorrow. Patient verbalized understanding and agreeable.   Routing to provider and will close encounter.

## 2018-01-03 ENCOUNTER — Other Ambulatory Visit: Payer: Self-pay

## 2018-01-03 ENCOUNTER — Ambulatory Visit: Payer: BLUE CROSS/BLUE SHIELD | Admitting: Obstetrics & Gynecology

## 2018-01-03 ENCOUNTER — Encounter: Payer: Self-pay | Admitting: Obstetrics & Gynecology

## 2018-01-03 VITALS — BP 122/80 | HR 96 | Temp 98.0°F | Resp 16 | Ht 64.0 in | Wt 123.0 lb

## 2018-01-03 DIAGNOSIS — N9089 Other specified noninflammatory disorders of vulva and perineum: Secondary | ICD-10-CM

## 2018-01-03 DIAGNOSIS — B3731 Acute candidiasis of vulva and vagina: Secondary | ICD-10-CM

## 2018-01-03 DIAGNOSIS — B373 Candidiasis of vulva and vagina: Secondary | ICD-10-CM

## 2018-01-03 MED ORDER — TRIAMCINOLONE ACETONIDE 0.5 % EX OINT: 1.0000 "application " | TOPICAL_OINTMENT | Freq: Three times a day (TID) | CUTANEOUS | 0 refills | Status: DC

## 2018-01-03 MED ORDER — FLUCONAZOLE 150 MG PO TABS: 150.0000 mg | ORAL_TABLET | Freq: Once | ORAL | 0 refills | Status: DC
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
## 2018-01-04 LAB — VAGINITIS/VAGINOSIS, DNA PROBE
Candida Species: POSITIVE — AB
Gardnerella vaginalis: NEGATIVE
Trichomonas vaginosis: NEGATIVE

## 2018-01-07 DIAGNOSIS — F330 Major depressive disorder, recurrent, mild: Secondary | ICD-10-CM | POA: Diagnosis not present

## 2018-01-08 ENCOUNTER — Telehealth: Payer: Self-pay | Admitting: Obstetrics & Gynecology

## 2018-01-08 MED ORDER — FLUCONAZOLE 150 MG PO TABS: 150.0000 mg | ORAL_TABLET | Freq: Once | ORAL | 0 refills | Status: AC

## 2018-01-08 NOTE — Telephone Encounter (Signed)
Patient has some questions for Dr.Miller's nurse after her appointment last Friday.

## 2018-01-08 NOTE — Telephone Encounter (Signed)
Spoke with patient. Patient reports vaginal itching and swelling are improving, still experiencing some vaginal discharge.   Patient states her diflucan RX only contained 1 tablet, she was advised to take 2nd tab 72 hrs later.   Reviewed OV note dated 01/03/18. Diflucan 150mg  po x 1, repeat 72 hours. Reviewed RX sent on 01/03/18, only 1 tab dispensed, no refills.   Rx for diflucan 150mg  po once #1/0RF to verified pharmacy.   Routing to provider for final review. Patient is agreeable to disposition. Will close encounter.

## 2018-01-09 DIAGNOSIS — F3342 Major depressive disorder, recurrent, in full remission: Secondary | ICD-10-CM | POA: Diagnosis not present

## 2018-01-09 DIAGNOSIS — F411 Generalized anxiety disorder: Secondary | ICD-10-CM | POA: Diagnosis not present

## 2018-01-09 DIAGNOSIS — F341 Dysthymic disorder: Secondary | ICD-10-CM | POA: Diagnosis not present

## 2018-01-14 DIAGNOSIS — R102 Pelvic and perineal pain: Secondary | ICD-10-CM | POA: Diagnosis not present

## 2018-01-14 DIAGNOSIS — M62838 Other muscle spasm: Secondary | ICD-10-CM | POA: Diagnosis not present

## 2018-01-14 DIAGNOSIS — F330 Major depressive disorder, recurrent, mild: Secondary | ICD-10-CM | POA: Diagnosis not present

## 2018-01-14 DIAGNOSIS — M6289 Other specified disorders of muscle: Secondary | ICD-10-CM | POA: Diagnosis not present

## 2018-01-27 ENCOUNTER — Telehealth: Payer: Self-pay | Admitting: Obstetrics & Gynecology

## 2018-01-27 DIAGNOSIS — R197 Diarrhea, unspecified: Secondary | ICD-10-CM | POA: Diagnosis not present

## 2018-01-27 DIAGNOSIS — M6281 Muscle weakness (generalized): Secondary | ICD-10-CM | POA: Diagnosis not present

## 2018-01-27 DIAGNOSIS — M62838 Other muscle spasm: Secondary | ICD-10-CM | POA: Diagnosis not present

## 2018-01-27 DIAGNOSIS — M6289 Other specified disorders of muscle: Secondary | ICD-10-CM | POA: Diagnosis not present

## 2018-01-27 NOTE — Telephone Encounter (Signed)
Spoke with patient. Patient reports vaginal itching around clitoris and vaginal dryness. Treated for yeast 11/8 with diflucan x2 and triamcinolone ointment, symptoms resolved. Started abx doxycyline, prescribed by GI, symptoms have returned. Triamcinolone ointment provides some relief, does not resolve symptoms. Denies vaginal d/c or odor.   Recommended OV for further evaluation, scheduled for 12/3 at 3:45pm with Dr. Sabra Heck.   Routing to provider for final review. Patient is agreeable to disposition. Will close encounter.

## 2018-01-27 NOTE — Telephone Encounter (Signed)
Patient is still having ongoing issues.

## 2018-01-28 ENCOUNTER — Encounter: Payer: Self-pay | Admitting: Obstetrics & Gynecology

## 2018-01-28 ENCOUNTER — Other Ambulatory Visit: Payer: Self-pay

## 2018-01-28 ENCOUNTER — Ambulatory Visit: Payer: BLUE CROSS/BLUE SHIELD | Admitting: Obstetrics & Gynecology

## 2018-01-28 VITALS — BP 106/76 | HR 100 | Resp 16 | Ht 64.0 in | Wt 123.2 lb

## 2018-01-28 DIAGNOSIS — K580 Irritable bowel syndrome with diarrhea: Secondary | ICD-10-CM | POA: Diagnosis not present

## 2018-01-28 DIAGNOSIS — F330 Major depressive disorder, recurrent, mild: Secondary | ICD-10-CM | POA: Diagnosis not present

## 2018-01-28 DIAGNOSIS — G43909 Migraine, unspecified, not intractable, without status migrainosus: Secondary | ICD-10-CM | POA: Diagnosis not present

## 2018-01-28 DIAGNOSIS — N898 Other specified noninflammatory disorders of vagina: Secondary | ICD-10-CM

## 2018-01-28 DIAGNOSIS — Z23 Encounter for immunization: Secondary | ICD-10-CM | POA: Diagnosis not present

## 2018-01-28 DIAGNOSIS — E119 Type 2 diabetes mellitus without complications: Secondary | ICD-10-CM | POA: Diagnosis not present

## 2018-01-28 DIAGNOSIS — B373 Candidiasis of vulva and vagina: Secondary | ICD-10-CM | POA: Diagnosis not present

## 2018-01-28 DIAGNOSIS — E611 Iron deficiency: Secondary | ICD-10-CM | POA: Diagnosis not present

## 2018-01-28 MED ORDER — FLUCONAZOLE 150 MG PO TABS: ORAL_TABLET | ORAL | 1 refills | Status: DC
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
## 2018-01-29 LAB — VAGINITIS/VAGINOSIS, DNA PROBE
Candida Species: NEGATIVE
Gardnerella vaginalis: NEGATIVE
TRICHOMONAS VAG: NEGATIVE

## 2018-01-31 ENCOUNTER — Telehealth: Payer: Self-pay | Admitting: Obstetrics & Gynecology

## 2018-01-31 NOTE — Telephone Encounter (Signed)
Note reviewed.  I have no additional recommendations.  Encounter can be closed.

## 2018-01-31 NOTE — Telephone Encounter (Signed)
Spoke with patient. Calling to provide update for Dr. Sabra Heck.   1. 01/27/18 Ferritin 25.9, down from 28.1 in 10/2017. Done by PCP/Dr. Brigitte Pulse.  2. Is seeing GI at [facility] on 02/03/18.  3. Capsule Endoscopy images now at [facility].   Advised will update Dr. Sabra Heck, our office will return call if any additional recommendations.   Routing to Dr. Sabra Heck

## 2018-01-31 NOTE — Telephone Encounter (Signed)
Patient had labs done with her PCP and she wants Dr.Miller to be aware that her ferritin level has dropped again.

## 2018-02-03 DIAGNOSIS — R197 Diarrhea, unspecified: Secondary | ICD-10-CM | POA: Diagnosis not present

## 2018-02-03 DIAGNOSIS — Z6821 Body mass index (BMI) 21.0-21.9, adult: Secondary | ICD-10-CM | POA: Diagnosis not present

## 2018-02-04 DIAGNOSIS — F330 Major depressive disorder, recurrent, mild: Secondary | ICD-10-CM | POA: Diagnosis not present

## 2018-02-06 DIAGNOSIS — Z23 Encounter for immunization: Secondary | ICD-10-CM | POA: Diagnosis not present

## 2018-02-06 DIAGNOSIS — L309 Dermatitis, unspecified: Secondary | ICD-10-CM | POA: Diagnosis not present

## 2018-02-07 ENCOUNTER — Encounter: Payer: Self-pay | Admitting: Obstetrics & Gynecology

## 2018-02-07 ENCOUNTER — Telehealth: Payer: Self-pay | Admitting: Obstetrics & Gynecology

## 2018-02-07 NOTE — Telephone Encounter (Signed)
Patient sent the following message through [place]. Routing to triage to assist patient with request.   Dr. Sabra Heck,    Symptoms of vaginitis have been resolved. Have you spoken with Wilda/Debra about returning to pelvic floor PT?     Just let me know.    Thank you,    [facility]

## 2018-02-07 NOTE — Telephone Encounter (Signed)
Routing to Dr. Sabra Heck to advise on pelvic PT.

## 2018-02-10 NOTE — Telephone Encounter (Signed)
Can you please let her know that I have left a message with Hart Robinsons but called again today and left a message.  I think it will be fine but will let her know definitively after Hart Robinsons calls me back.  Thanks.

## 2018-02-10 NOTE — Telephone Encounter (Signed)
Spoke with patient, advised as seen below per Dr. Sabra Heck. Patient reports all of her vaginal symptoms have resolved, will await return call regarding PT. Patient thankful for f/u.   Routing to Dr. Sabra Heck.

## 2018-02-11 DIAGNOSIS — F330 Major depressive disorder, recurrent, mild: Secondary | ICD-10-CM | POA: Diagnosis not present

## 2018-02-11 NOTE — Telephone Encounter (Signed)
Call returned to patient, left detailed message, ok per dpr. Advised as seen below per Dr. Sabra Heck. Return call to office if any additional questions/concerns.   Encounter closed.

## 2018-02-11 NOTE — Telephone Encounter (Signed)
Please let pt know Hart Robinsons called me yesterday.  After discussing my recent recommendations about twice weekly vaginal estrogen use, she feels Ms Rabon should use this regularly for about six weeks and then return for PT.  I think she should have been using it about two weeks now so that would be in about a month.  I'm hoping with improved estrogen vaginally, she will be able to have better outcome with the pelvic PT.

## 2018-02-12 DIAGNOSIS — L309 Dermatitis, unspecified: Secondary | ICD-10-CM | POA: Diagnosis not present

## 2018-02-25 ENCOUNTER — Telehealth: Payer: Self-pay | Admitting: Obstetrics & Gynecology

## 2018-02-25 DIAGNOSIS — F330 Major depressive disorder, recurrent, mild: Secondary | ICD-10-CM | POA: Diagnosis not present

## 2018-02-25 NOTE — Telephone Encounter (Signed)
Patient is currently taking the Premarin vaginal cream and stated that she "doesn't know what is going on with her body." Patient stated that she is experiencing tenderness, no swelling, redness or burning. Patient stated that she does not know if this is normal. Patient stated that this is nothing urgent, she just feels "out of whack."

## 2018-02-25 NOTE — Telephone Encounter (Signed)
Spoke with patient. Patient restarted using premarin vaginal cream 1/2 g last night, has not used it all month. States vaginal area feels different, "different sensation" is unsure what her normal is and what to expect with premarin. Denies redness, pain, vaginal bleeding, breast tenderness, swelling, itching, vaginal odor or d/c.   Advised patient if she just recently restarted premarin vaginal cream can take a few wks for medication to work and rehydrate vaginal tissue. Advised to continue to use premarin vaginal cream as indicated by Dr. Sabra Heck.  Return call to office if symptoms do not resolve or if new symptoms develop. Reviewed with patient common side effects of vaginal estrogen cream. Patients questions answered. Advised our office will return call if any additional recommendations.    Routing to provider for final review. Patient is agreeable to disposition. Will close encounter.

## 2018-02-27 DIAGNOSIS — A049 Bacterial intestinal infection, unspecified: Secondary | ICD-10-CM | POA: Diagnosis not present

## 2018-02-28 ENCOUNTER — Telehealth: Payer: Self-pay | Admitting: Obstetrics & Gynecology

## 2018-02-28 NOTE — Telephone Encounter (Signed)
Spoke with patient. Patient reports she has vaginal itching and swelling, feels like she is developing a yeast infection. Just recently restarted premarin vaginal cream. Denies vaginal d/c, odor or urinary symptoms. "Has a cold".   Seen by GI on 1/2, will start xifaxan in upcoming days, unsure of when she will start. Wants Dr. Sabra Heck to know she has a "double peak in hydrogen when she digest sugar".   Patient has a refill of diflucan, asking if ok to take and start new medication of xifaxan? Patient request Dr. Sabra Heck review this and advise given her GI Hx.   Advised I will review with Dr. Sabra Heck and return call with recommendations.   Routing to Dr. Sabra Heck to advise.

## 2018-02-28 NOTE — Telephone Encounter (Signed)
Patient called stating that she was seen on Tuesday with what she believed was a UTI. She stated that it was in fact a UTI. Patient stated that she is currently sick, but has a medication that she took in the past and is wondering if she can take that. Patient stated that she is going to be starting a "hardcore antibiotic" for GI issues. Call was transferred to [facility], [place].

## 2018-02-28 NOTE — Telephone Encounter (Signed)
Ok to use the diflucan and then the xifixan.

## 2018-02-28 NOTE — Telephone Encounter (Signed)
Call to patient and she is given message from Dr. Sabra Heck.  Will call back with any concerns.  Encounter closed.

## 2018-03-04 DIAGNOSIS — F330 Major depressive disorder, recurrent, mild: Secondary | ICD-10-CM | POA: Diagnosis not present

## 2018-03-11 DIAGNOSIS — F330 Major depressive disorder, recurrent, mild: Secondary | ICD-10-CM | POA: Diagnosis not present

## 2018-03-17 ENCOUNTER — Telehealth: Payer: Self-pay | Admitting: Obstetrics & Gynecology

## 2018-03-17 NOTE — Telephone Encounter (Signed)
Spoke with patient. Patient reports vaginal itching and white, milky, vaginal d/c. Symptoms started 1/17, took 1 diflucan, no change in symptoms, took a second diflucan this morning. Patient states she just feels like "her body isn't right and the vaginal d/c is not normal". Denies odor, urinary symptoms, abnormal bleeding or pelvic pain.   Recommended OV for further evaluation, OV scheduled for 1/22 at 12pm with Dr. Sabra Heck.   Routing to provider for final review. Patient is agreeable to disposition. Will close encounter.

## 2018-03-17 NOTE — Telephone Encounter (Signed)
Patient is calling regarding a reoccurring yeast infection. Patient stated that the symptoms started on Friday. Patient stated that she had "leftover Diflucan" and took that to get her through the weekend. Patient stated that she is currently on a low dose of antibiotics for intestinal bacteria, but was told that should not be the cause of the reoccurring yeast. Patient had a death in the family and stated that she will not be available until Wednesday for appointment.

## 2018-03-18 DIAGNOSIS — F330 Major depressive disorder, recurrent, mild: Secondary | ICD-10-CM | POA: Diagnosis not present

## 2018-03-19 ENCOUNTER — Telehealth: Payer: Self-pay | Admitting: Obstetrics & Gynecology

## 2018-03-19 ENCOUNTER — Ambulatory Visit: Payer: Self-pay | Admitting: Obstetrics & Gynecology

## 2018-03-19 NOTE — Telephone Encounter (Signed)
Patient called and cancelled her appointment for today with Dr. Sabra Heck due to having a "bad cold." Patient rescheduled to 03/25/18 at 10:30 AM.

## 2018-03-25 ENCOUNTER — Other Ambulatory Visit: Payer: Self-pay

## 2018-03-25 ENCOUNTER — Ambulatory Visit (INDEPENDENT_AMBULATORY_CARE_PROVIDER_SITE_OTHER): Payer: BLUE CROSS/BLUE SHIELD | Admitting: Obstetrics & Gynecology

## 2018-03-25 VITALS — BP 102/70 | HR 100 | Resp 16 | Ht 64.0 in | Wt 124.0 lb

## 2018-03-25 DIAGNOSIS — L309 Dermatitis, unspecified: Secondary | ICD-10-CM | POA: Diagnosis not present

## 2018-03-25 DIAGNOSIS — R21 Rash and other nonspecific skin eruption: Secondary | ICD-10-CM | POA: Diagnosis not present

## 2018-03-25 DIAGNOSIS — N951 Menopausal and female climacteric states: Secondary | ICD-10-CM

## 2018-03-25 DIAGNOSIS — N9089 Other specified noninflammatory disorders of vulva and perineum: Secondary | ICD-10-CM | POA: Diagnosis not present

## 2018-03-25 DIAGNOSIS — D1801 Hemangioma of skin and subcutaneous tissue: Secondary | ICD-10-CM | POA: Diagnosis not present

## 2018-03-25 DIAGNOSIS — L570 Actinic keratosis: Secondary | ICD-10-CM | POA: Diagnosis not present

## 2018-03-25 DIAGNOSIS — N898 Other specified noninflammatory disorders of vagina: Secondary | ICD-10-CM

## 2018-03-25 DIAGNOSIS — F330 Major depressive disorder, recurrent, mild: Secondary | ICD-10-CM | POA: Diagnosis not present
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
## 2018-03-26 DIAGNOSIS — N898 Other specified noninflammatory disorders of vagina: Secondary | ICD-10-CM | POA: Diagnosis not present

## 2018-03-27 ENCOUNTER — Encounter: Payer: Self-pay | Admitting: Obstetrics & Gynecology

## 2018-03-28 LAB — CANDIDA 6 SPECIES PROFILE, NAA
C PARAPSILOSIS/TROPICALIS: NEGATIVE
CANDIDA LUSITANIAE, NAA: NEGATIVE
Candida albicans, NAA: NEGATIVE
Candida glabrata, NAA: NEGATIVE
Candida krusei, NAA: NEGATIVE

## 2018-03-28 LAB — BACTERIAL VAGINOSIS, NAA

## 2018-04-01 ENCOUNTER — Telehealth: Payer: Self-pay | Admitting: Obstetrics & Gynecology

## 2018-04-01 DIAGNOSIS — D649 Anemia, unspecified: Secondary | ICD-10-CM

## 2018-04-01 DIAGNOSIS — F330 Major depressive disorder, recurrent, mild: Secondary | ICD-10-CM | POA: Diagnosis not present

## 2018-04-01 NOTE — Telephone Encounter (Signed)
Spoke with patient, advised as seen below per Dr. Sabra Heck. Patient verbalizes understanding and is agreeable.    Patient request to f/u with hematology referral for "chronic low ferritin". Discussed options while in office, waiting f/u. Advised I will review with Dr. Sabra Heck and return call once referral placed, patient agreeable.    Dr. Sabra Heck -please advise on hematology referral.

## 2018-04-01 NOTE — Telephone Encounter (Signed)
Please let pt know the testing was negative for multiple yeast types and also negative for bacterial change.  Some discharge occurs with menopause due to estrogen changes and I have prescribed vaginal estrogen for her which is not using regularly.  So, I would recommend she use the vaginal estrogen more regularly (twice weekly).  Thanks.

## 2018-04-01 NOTE — Telephone Encounter (Signed)
Patient calling for results from 03/25/2018.

## 2018-04-01 NOTE — Telephone Encounter (Signed)
03/26/18 Nuswab and yeast testing negative, routing to Dr. Sabra Heck to review and advise.

## 2018-04-07 DIAGNOSIS — D649 Anemia, unspecified: Secondary | ICD-10-CM | POA: Diagnosis not present

## 2018-04-07 NOTE — Telephone Encounter (Signed)
Patient is calling to follow up on referral to hematology.

## 2018-04-08 DIAGNOSIS — F330 Major depressive disorder, recurrent, mild: Secondary | ICD-10-CM | POA: Diagnosis not present

## 2018-04-08 NOTE — Telephone Encounter (Signed)
Call reviewed with Dr. Sabra Heck. Order placed for ambulatory referral to hematology,Dr. Curt Bears at [facility] for anemia.   Call returned to patient, left detailed message, ok per dpr. Advised as seen above. Advised our office referral coordinator will f/u with appt details once appt is scheduled. Return call to office if any additional questions.   Routing to [employer] .  Encounter closed.

## 2018-04-15 ENCOUNTER — Telehealth: Payer: Self-pay | Admitting: Obstetrics & Gynecology

## 2018-04-15 DIAGNOSIS — D649 Anemia, unspecified: Secondary | ICD-10-CM

## 2018-04-15 DIAGNOSIS — F330 Major depressive disorder, recurrent, mild: Secondary | ICD-10-CM | POA: Diagnosis not present

## 2018-04-15 NOTE — Telephone Encounter (Signed)
Routing to [employer]  to f/u. Referral was placed to Dr. Missy Sabins [place].

## 2018-04-15 NOTE — Telephone Encounter (Signed)
Patient left a voicemail over lunch stating that she had spoken with Dr. Sabra Heck regarding getting a second opinion after seeing Dr. Marin Olp and wanting a "fresh set of eyes to go look over her issues." Patient stated that she received a call from Dr. Antonieta Pert office requesting that she re-establish care at their office. Patient is questioning if referral went to Dr. Antonieta Pert office instead of to a new provider.

## 2018-04-16 NOTE — Telephone Encounter (Signed)
Previous referral closed. New order placed for ambulatory referral to hematology [facility], Dr. Julien Nordmann.

## 2018-04-17 NOTE — Telephone Encounter (Signed)
Spoke patient, advised new referral has been placed to Dr. Julien Nordmann at [facility]. Advised patient the referral was initially placed on 2/11 to Dr. Julien Nordmann as advised, their referral coordinator routed to Dr. Marin Olp for scheduling since she was an established patient. Advised new referral has been placed requesting to schedule in [place]. Our referral coordinator will contact with appt details once scheduled. Patient thankful for return call.   Routing to provider for final review. Patient is agreeable to disposition. Will close encounter.  Cc: Magdalene Patricia

## 2018-04-21 ENCOUNTER — Telehealth: Payer: Self-pay | Admitting: Hematology

## 2018-04-21 NOTE — Telephone Encounter (Signed)
A new hem appt has been scheduled for the pt to see Dr. Irene Limbo on 3/18 at 1pm. Pt aware to arrive 30 minutes early. She wanted to transfer care from Dr. Marin Olp to the [facility] location.

## 2018-04-23 DIAGNOSIS — F330 Major depressive disorder, recurrent, mild: Secondary | ICD-10-CM | POA: Diagnosis not present

## 2018-04-24 ENCOUNTER — Other Ambulatory Visit: Payer: Self-pay | Admitting: Internal Medicine

## 2018-04-24 DIAGNOSIS — Z1231 Encounter for screening mammogram for malignant neoplasm of breast: Secondary | ICD-10-CM

## 2018-04-29 DIAGNOSIS — F330 Major depressive disorder, recurrent, mild: Secondary | ICD-10-CM | POA: Diagnosis not present

## 2018-04-30 DIAGNOSIS — M62838 Other muscle spasm: Secondary | ICD-10-CM | POA: Diagnosis not present

## 2018-04-30 DIAGNOSIS — M6281 Muscle weakness (generalized): Secondary | ICD-10-CM | POA: Diagnosis not present

## 2018-04-30 DIAGNOSIS — M6289 Other specified disorders of muscle: Secondary | ICD-10-CM | POA: Diagnosis not present

## 2018-04-30 DIAGNOSIS — R102 Pelvic and perineal pain: Secondary | ICD-10-CM | POA: Diagnosis not present

## 2018-05-05 DIAGNOSIS — R197 Diarrhea, unspecified: Secondary | ICD-10-CM | POA: Diagnosis not present

## 2018-05-05 DIAGNOSIS — N950 Postmenopausal bleeding: Secondary | ICD-10-CM | POA: Insufficient documentation

## 2018-05-05 DIAGNOSIS — R102 Pelvic and perineal pain: Secondary | ICD-10-CM | POA: Diagnosis not present

## 2018-05-05 DIAGNOSIS — M62838 Other muscle spasm: Secondary | ICD-10-CM | POA: Diagnosis not present

## 2018-05-05 DIAGNOSIS — M6289 Other specified disorders of muscle: Secondary | ICD-10-CM | POA: Diagnosis not present

## 2018-05-06 ENCOUNTER — Telehealth: Payer: Self-pay | Admitting: Obstetrics & Gynecology

## 2018-05-06 DIAGNOSIS — F330 Major depressive disorder, recurrent, mild: Secondary | ICD-10-CM | POA: Diagnosis not present

## 2018-05-06 NOTE — Telephone Encounter (Signed)
Patient started spotting recently and is now bleeding with brown blood.

## 2018-05-06 NOTE — Telephone Encounter (Signed)
Spoke with patient. Patient is postmenopausal. Reports spotting that started on 05/05/18, describes flow as light and blood is dark. Reports decreased appetite for a few days. Denies any other GYN symptoms, pain, fever/chills, N/V. Using premarin vaginal cream twice wkly. Saw Ileana Roup for PT last wk, "tissue look good and healthier". Had a visceral massage. Recommended OV for further evaluation, OV scheduled for 3/12 at 1115 with Dr. Sabra Heck. Patient verbalizes understanding and is agreeable. Length of call 20 min.   Routing to provider for final review. Patient is agreeable to disposition. Will close encounter.

## 2018-05-08 ENCOUNTER — Encounter: Payer: Self-pay | Admitting: Obstetrics & Gynecology

## 2018-05-08 ENCOUNTER — Ambulatory Visit: Payer: BLUE CROSS/BLUE SHIELD | Admitting: Obstetrics & Gynecology

## 2018-05-08 ENCOUNTER — Other Ambulatory Visit: Payer: Self-pay

## 2018-05-08 VITALS — BP 96/60 | HR 96 | Resp 16 | Ht 64.0 in | Wt 126.0 lb

## 2018-05-08 DIAGNOSIS — N939 Abnormal uterine and vaginal bleeding, unspecified: Secondary | ICD-10-CM | POA: Diagnosis not present

## 2018-05-08 DIAGNOSIS — E039 Hypothyroidism, unspecified: Secondary | ICD-10-CM | POA: Diagnosis not present
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
## 2018-05-09 ENCOUNTER — Telehealth: Payer: Self-pay | Admitting: Obstetrics & Gynecology

## 2018-05-09 ENCOUNTER — Encounter: Payer: Self-pay | Admitting: Obstetrics & Gynecology

## 2018-05-09 LAB — ESTRADIOL: Estradiol: 68.9 pg/mL

## 2018-05-09 LAB — T4, FREE: FREE T4: 1.23 ng/dL (ref 0.82–1.77)

## 2018-05-09 LAB — FOLLICLE STIMULATING HORMONE: FSH: 45 m[IU]/mL

## 2018-05-09 LAB — TSH: TSH: 1.35 u[IU]/mL (ref 0.450–4.500)

## 2018-05-09 NOTE — Telephone Encounter (Signed)
Patient sent the following message through [place]. Routing to triage to assist patient with request.  Hi Dr. Sabra Heck,    How should I use or not use the Premarin cream. Still bleeding a little but not like a regular period. When should bleeding resolve?    Thanks!    Denise Kerr

## 2018-05-12 NOTE — Telephone Encounter (Signed)
She can start back once the bleeding has completely stopped. Thanks.

## 2018-05-12 NOTE — Telephone Encounter (Signed)
Routing to Dr. Miller to review and advise.  

## 2018-05-12 NOTE — Telephone Encounter (Signed)
Spoke with patient, advised as seen below per Dr. Miller. Patient verbalizes understanding and is agreeable.   Encounter closed.  

## 2018-05-13 DIAGNOSIS — R3911 Hesitancy of micturition: Secondary | ICD-10-CM | POA: Diagnosis not present

## 2018-05-13 DIAGNOSIS — F330 Major depressive disorder, recurrent, mild: Secondary | ICD-10-CM | POA: Diagnosis not present

## 2018-05-13 DIAGNOSIS — M6281 Muscle weakness (generalized): Secondary | ICD-10-CM | POA: Diagnosis not present

## 2018-05-13 DIAGNOSIS — R102 Pelvic and perineal pain: Secondary | ICD-10-CM | POA: Diagnosis not present

## 2018-05-13 DIAGNOSIS — L910 Hypertrophic scar: Secondary | ICD-10-CM | POA: Diagnosis not present
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
## 2018-05-14 ENCOUNTER — Telehealth: Payer: Self-pay | Admitting: Hematology

## 2018-05-14 ENCOUNTER — Inpatient Hospital Stay: Payer: BLUE CROSS/BLUE SHIELD | Attending: Hematology | Admitting: Hematology

## 2018-05-14 ENCOUNTER — Inpatient Hospital Stay: Payer: BLUE CROSS/BLUE SHIELD

## 2018-05-14 ENCOUNTER — Other Ambulatory Visit: Payer: Self-pay

## 2018-05-14 VITALS — BP 116/69 | HR 93 | Temp 98.3°F | Resp 18 | Ht 64.0 in | Wt 130.9 lb

## 2018-05-14 DIAGNOSIS — M25569 Pain in unspecified knee: Secondary | ICD-10-CM

## 2018-05-14 DIAGNOSIS — R5383 Other fatigue: Secondary | ICD-10-CM

## 2018-05-14 DIAGNOSIS — Z87440 Personal history of urinary (tract) infections: Secondary | ICD-10-CM | POA: Diagnosis not present

## 2018-05-14 DIAGNOSIS — R197 Diarrhea, unspecified: Secondary | ICD-10-CM | POA: Diagnosis not present

## 2018-05-14 DIAGNOSIS — E119 Type 2 diabetes mellitus without complications: Secondary | ICD-10-CM | POA: Diagnosis not present

## 2018-05-14 DIAGNOSIS — K909 Intestinal malabsorption, unspecified: Secondary | ICD-10-CM

## 2018-05-14 DIAGNOSIS — E039 Hypothyroidism, unspecified: Secondary | ICD-10-CM

## 2018-05-14 DIAGNOSIS — E785 Hyperlipidemia, unspecified: Secondary | ICD-10-CM

## 2018-05-14 DIAGNOSIS — M25559 Pain in unspecified hip: Secondary | ICD-10-CM | POA: Diagnosis not present

## 2018-05-14 DIAGNOSIS — K9089 Other intestinal malabsorption: Secondary | ICD-10-CM | POA: Diagnosis not present

## 2018-05-14 DIAGNOSIS — Z79899 Other long term (current) drug therapy: Secondary | ICD-10-CM

## 2018-05-14 DIAGNOSIS — E531 Pyridoxine deficiency: Secondary | ICD-10-CM

## 2018-05-14 DIAGNOSIS — Q2733 Arteriovenous malformation of digestive system vessel: Secondary | ICD-10-CM | POA: Diagnosis not present

## 2018-05-14 DIAGNOSIS — R531 Weakness: Secondary | ICD-10-CM | POA: Diagnosis not present

## 2018-05-14 DIAGNOSIS — E611 Iron deficiency: Secondary | ICD-10-CM

## 2018-05-14 DIAGNOSIS — Z9049 Acquired absence of other specified parts of digestive tract: Secondary | ICD-10-CM

## 2018-05-14 LAB — CBC WITH DIFFERENTIAL/PLATELET
Abs Immature Granulocytes: 0.01 10*3/uL (ref 0.00–0.07)
Basophils Absolute: 0 10*3/uL (ref 0.0–0.1)
Basophils Relative: 1 %
Eosinophils Absolute: 0.1 10*3/uL (ref 0.0–0.5)
Eosinophils Relative: 2 %
HCT: 38.3 % (ref 36.0–46.0)
Hemoglobin: 12.3 g/dL (ref 12.0–15.0)
Immature Granulocytes: 0 %
Lymphocytes Relative: 28 %
Lymphs Abs: 1.4 10*3/uL (ref 0.7–4.0)
MCH: 30 pg (ref 26.0–34.0)
MCHC: 32.1 g/dL (ref 30.0–36.0)
MCV: 93.4 fL (ref 80.0–100.0)
Monocytes Absolute: 0.3 10*3/uL (ref 0.1–1.0)
Monocytes Relative: 6 %
Neutro Abs: 3.1 10*3/uL (ref 1.7–7.7)
Neutrophils Relative %: 63 %
Platelets: 189 10*3/uL (ref 150–400)
RBC: 4.1 MIL/uL (ref 3.87–5.11)
RDW: 13.1 % (ref 11.5–15.5)
WBC: 4.9 10*3/uL (ref 4.0–10.5)
nRBC: 0 % (ref 0.0–0.2)

## 2018-05-14 LAB — IRON AND TIBC
Iron: 38 ug/dL — ABNORMAL LOW (ref 41–142)
Saturation Ratios: 13 % — ABNORMAL LOW (ref 21–57)
TIBC: 288 ug/dL (ref 236–444)
UIBC: 250 ug/dL (ref 120–384)

## 2018-05-14 LAB — CMP (CANCER CENTER ONLY)
ALT: 14 U/L (ref 0–44)
AST: 14 U/L — ABNORMAL LOW (ref 15–41)
Albumin: 3.9 g/dL (ref 3.5–5.0)
Alkaline Phosphatase: 70 U/L (ref 38–126)
Anion gap: 9 (ref 5–15)
BUN: 13 mg/dL (ref 6–20)
CO2: 27 mmol/L (ref 22–32)
Calcium: 9.1 mg/dL (ref 8.9–10.3)
Chloride: 104 mmol/L (ref 98–111)
Creatinine: 0.76 mg/dL (ref 0.44–1.00)
GFR, Est AFR Am: >60 mL/min (ref >60)
GFR, Estimated: >60 mL/min (ref >60)
Glucose, Bld: 100 mg/dL — ABNORMAL HIGH (ref 70–99)
POTASSIUM: 4 mmol/L (ref 3.5–5.1)
Sodium: 140 mmol/L (ref 135–145)
Total Bilirubin: 0.3 mg/dL (ref 0.3–1.2)
Total Protein: 6.8 g/dL (ref 6.5–8.1)

## 2018-05-14 LAB — FERRITIN: Ferritin: 27 ng/mL (ref 11–307)

## 2018-05-14 LAB — VITAMIN B12: Vitamin B-12: 301 pg/mL (ref 180–914)

## 2018-05-14 NOTE — Telephone Encounter (Signed)
Schedule lab addon, rtc based on labs

## 2018-05-15 LAB — FOLATE RBC
Folate, Hemolysate: 368 ng/mL
Folate, RBC: 974 ng/mL (ref >498)
Hematocrit: 37.8 % (ref 34.0–46.6)

## 2018-05-16 LAB — CARNITINE / ACYLCARNITINE PROFILE, BLD
Carnitine, Esterfied/Free: 0.1 Ratio (ref 0.0–0.9)
Carnitine, Free: 40 umol/L (ref 20–55)
Carnitine, Total: 42 umol/L (ref 27–73)

## 2018-05-16 LAB — COPPER, SERUM: COPPER: 94 ug/dL (ref 72–166)

## 2018-05-20 DIAGNOSIS — F330 Major depressive disorder, recurrent, mild: Secondary | ICD-10-CM | POA: Diagnosis not present

## 2018-05-23 ENCOUNTER — Ambulatory Visit: Payer: BLUE CROSS/BLUE SHIELD

## 2018-05-27 DIAGNOSIS — F330 Major depressive disorder, recurrent, mild: Secondary | ICD-10-CM | POA: Diagnosis not present

## 2018-06-03 ENCOUNTER — Telehealth: Payer: Self-pay | Admitting: *Deleted

## 2018-06-03 DIAGNOSIS — F330 Major depressive disorder, recurrent, mild: Secondary | ICD-10-CM | POA: Diagnosis not present

## 2018-06-03 NOTE — Telephone Encounter (Signed)
Patient called to ask about results from lab work on 3/8. Contacted patient regarding test results per Dr. Irene Limbo. Patient asked if call can be delayed as she is in telehealth session with therapist. Advised patient will call back later today or in the am. Patient in agreement.  Per. DR. Irene Limbo, inform patient: "B12 borderline low at 301 - recommend OTC sublingual B12 1000 microgram daily to target levels of >500  Borderline iron deficiency. Will need balance between poor absorption from bowel surgery vs increased absorption from her hemochromatosis carrier status - would recommend only PO iron -Iron polysaccharide 150mg  po day for 2-3 months and f/u with PCP to target ferritin level of 50. Continue f/u with PCP"

## 2018-06-04 ENCOUNTER — Other Ambulatory Visit: Payer: Self-pay | Admitting: Hematology

## 2018-06-04 ENCOUNTER — Other Ambulatory Visit: Payer: Self-pay | Admitting: *Deleted

## 2018-06-04 MED ORDER — POLYSACCHARIDE IRON COMPLEX 150 MG PO CAPS: 150.0000 mg | ORAL_CAPSULE | Freq: Every day | ORAL | 2 refills | Status: DC

## 2018-06-04 MED ORDER — B-12 1000 MCG SL SUBL: 1.0000 | SUBLINGUAL_TABLET | Freq: Every day | SUBLINGUAL | 2 refills | Status: DC

## 2018-06-04 NOTE — Telephone Encounter (Signed)
Contacted patient with information documented earlier r/t B12 and Iron deficiency. Patient verbalized understanding of all information. States she has tried taking B12 and  Iron in the past with no increase in level of either, but that she will try again. Asked that prescription for Iron be sent to [facility] @ [address]

## 2018-06-06 ENCOUNTER — Telehealth: Payer: Self-pay | Admitting: Obstetrics & Gynecology

## 2018-06-06 DIAGNOSIS — N950 Postmenopausal bleeding: Secondary | ICD-10-CM

## 2018-06-06 NOTE — Telephone Encounter (Signed)
Patient states he continues to have PMB. Would like to speak with nurse.

## 2018-06-06 NOTE — Telephone Encounter (Signed)
Spoke with patient.  Office visit with Dr. Sabra Heck 05/08/2018.   Was to stop premarin until she stopped having vaginal bleeding.  Stopped having vaginal bleeding about two weeks ago and then used vaginal estrogen cream one time. Since using the vaginal estrogen cream she has had daily vaginal bleeding. Changes coverage 3 times daily, sometimes dark brown blood, sometimes dark red blood. No pain or heavy bleeding. No vaginal discharge. Denies feeling dizzy, weak, or lightheaded.   Was advised by Dr. Sabra Heck to call if she had bleeding again and so she states she is doing so. She has stopped using her vaginal cream again.   Routing to Dr. Sabra Heck and Dr. Quincy Simmonds for review and advice for follow up necessary.

## 2018-06-06 NOTE — Telephone Encounter (Signed)
Please schedule patient for an office visit, pelvic ultrasound, and potential endometrial biopsy.

## 2018-06-06 NOTE — Telephone Encounter (Signed)
Spoke with patient. Agreeable to plan for follow up with Dr. Quincy Simmonds.  Ultrasound/consult and possible Endometrial biopsy with Dr. Quincy Simmonds scheduled for 06/12/2018.  Pt agreeable and orders placed.  Will call back with any concerns prior to appointment.  Encounter closed.

## 2018-06-09 ENCOUNTER — Telehealth: Payer: Self-pay | Admitting: Obstetrics and Gynecology

## 2018-06-09 NOTE — Telephone Encounter (Signed)
Spoke with patient. Patient is asking if she needs to be sedated for an EMB. Advised patient she does not need to be sedated for an EMB. Advised this is routinely something that we perform in the office. Advised patient to take 800 mg of Iburprofen/Motrin 1 hour before her appointment in case EMB needs to be performed. Patient verbalizes understanding. Encounter closed.

## 2018-06-09 NOTE — Telephone Encounter (Signed)
Patient is calling regarding ultrasound appointment for Thursday. Patient stated that she was informed by a friend that she might need sedation. Patient is calling requesting more information before feeling comfortable to proceed with ultrasound.

## 2018-06-10 DIAGNOSIS — F330 Major depressive disorder, recurrent, mild: Secondary | ICD-10-CM | POA: Diagnosis not present

## 2018-06-11 ENCOUNTER — Other Ambulatory Visit: Payer: Self-pay

## 2018-06-12 ENCOUNTER — Ambulatory Visit (INDEPENDENT_AMBULATORY_CARE_PROVIDER_SITE_OTHER): Payer: BLUE CROSS/BLUE SHIELD | Admitting: Obstetrics and Gynecology

## 2018-06-12 ENCOUNTER — Encounter: Payer: Self-pay | Admitting: Obstetrics and Gynecology

## 2018-06-12 ENCOUNTER — Ambulatory Visit (INDEPENDENT_AMBULATORY_CARE_PROVIDER_SITE_OTHER): Payer: BLUE CROSS/BLUE SHIELD

## 2018-06-12 ENCOUNTER — Other Ambulatory Visit: Payer: Self-pay

## 2018-06-12 DIAGNOSIS — N950 Postmenopausal bleeding: Secondary | ICD-10-CM

## 2018-06-12 DIAGNOSIS — N924 Excessive bleeding in the premenopausal period: Secondary | ICD-10-CM | POA: Diagnosis not present

## 2018-06-12 NOTE — Addendum Note (Signed)
Addended by: Yisroel Ramming, Dietrich Pates E on: 06/12/2018 10:19 AM   Modules accepted: Orders

## 2018-06-12 NOTE — Patient Instructions (Signed)
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
## 2018-06-12 NOTE — Progress Notes (Signed)
Encounter reviewed by Dr. Brook Amundson C. Silva.  

## 2018-06-17 ENCOUNTER — Telehealth: Payer: Self-pay | Admitting: *Deleted

## 2018-06-17 ENCOUNTER — Other Ambulatory Visit: Payer: Self-pay | Admitting: *Deleted

## 2018-06-17 DIAGNOSIS — N950 Postmenopausal bleeding: Secondary | ICD-10-CM

## 2018-06-17 DIAGNOSIS — F330 Major depressive disorder, recurrent, mild: Secondary | ICD-10-CM | POA: Diagnosis not present

## 2018-06-17 NOTE — Telephone Encounter (Signed)
Left message to call Sharee Pimple, RN at [facility].    Reschedule [facility] to 06/26/18 per Dr. Quincy Simmonds.

## 2018-06-18 NOTE — Telephone Encounter (Signed)
Spoke with patient, [facility] rescheduled to 06/26/18 at 10am with Dr. Quincy Simmonds.   Routing to provider for final review. Patient is agreeable to disposition. Will close encounter.  Cc: Thayer Ohm

## 2018-06-19 ENCOUNTER — Other Ambulatory Visit: Payer: Self-pay

## 2018-06-19 ENCOUNTER — Other Ambulatory Visit: Payer: Self-pay | Admitting: Obstetrics and Gynecology

## 2018-06-20 ENCOUNTER — Telehealth: Payer: Self-pay | Admitting: Obstetrics and Gynecology

## 2018-06-20 ENCOUNTER — Other Ambulatory Visit: Payer: Self-pay

## 2018-06-20 ENCOUNTER — Ambulatory Visit: Payer: BLUE CROSS/BLUE SHIELD | Admitting: Obstetrics and Gynecology

## 2018-06-20 ENCOUNTER — Encounter: Payer: Self-pay | Admitting: Obstetrics and Gynecology

## 2018-06-20 VITALS — BP 110/64 | HR 64 | Temp 97.9°F | Resp 16 | Wt 124.0 lb

## 2018-06-20 DIAGNOSIS — N939 Abnormal uterine and vaginal bleeding, unspecified: Secondary | ICD-10-CM

## 2018-06-20 MED ORDER — MEDROXYPROGESTERONE ACETATE 10 MG PO TABS: 10.0000 mg | ORAL_TABLET | Freq: Every day | ORAL | 0 refills | Status: DC

## 2018-06-20 NOTE — Telephone Encounter (Signed)
Spoke with patient. Patient is scheduled for [facility] on 4/30 with Dr. Quincy Simmonds, would like to discuss concerns about bleeding. Reports quart sized clots, thick blood that does not saturate pad. Was heavier last night. Fatigue with headache and feeling "lethargic" the past 2 days, concerned with her hx of iron deficiency anemia. Patient states she is concerned going into the weekend, does not want to go to ER. Advised if any concerns can call on call provider for further instruction, offered OV today with Dr. Quincy Simmonds. Patient placed on hold, call reviewed with Dr. Quincy Simmonds. OV scheduled for today at 2pm. Patient verbalizes understanding and is agreeable.   Encounter closed.
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
## 2018-06-20 NOTE — Telephone Encounter (Signed)
Patient would like to discuss bleeding she is having prior to her ultrasound appointment on 06/26/2018.

## 2018-06-21 LAB — CBC
Hematocrit: 39.4 % (ref 34.0–46.6)
Hemoglobin: 13 g/dL (ref 11.1–15.9)
MCH: 30.2 pg (ref 26.6–33.0)
MCHC: 33 g/dL (ref 31.5–35.7)
MCV: 91 fL (ref 79–97)
Platelets: 240 10*3/uL (ref 150–450)
RBC: 4.31 x10E6/uL (ref 3.77–5.28)
RDW: 12.7 % (ref 11.7–15.4)
WBC: 5.6 10*3/uL (ref 3.4–10.8)

## 2018-06-21 LAB — IRON: Iron: 45 ug/dL (ref 27–159)

## 2018-06-21 LAB — FERRITIN: Ferritin: 27 ng/mL (ref 15–150)

## 2018-06-23 ENCOUNTER — Ambulatory Visit: Payer: BLUE CROSS/BLUE SHIELD

## 2018-06-24 ENCOUNTER — Ambulatory Visit: Payer: BLUE CROSS/BLUE SHIELD | Admitting: Obstetrics and Gynecology

## 2018-06-24 ENCOUNTER — Encounter: Payer: Self-pay | Admitting: Obstetrics and Gynecology

## 2018-06-24 ENCOUNTER — Telehealth: Payer: Self-pay | Admitting: Obstetrics and Gynecology

## 2018-06-24 ENCOUNTER — Other Ambulatory Visit: Payer: Self-pay

## 2018-06-24 VITALS — BP 102/62 | HR 100 | Temp 98.2°F | Ht 64.0 in | Wt 124.0 lb

## 2018-06-24 DIAGNOSIS — N921 Excessive and frequent menstruation with irregular cycle: Secondary | ICD-10-CM | POA: Diagnosis not present

## 2018-06-24 DIAGNOSIS — N924 Excessive bleeding in the premenopausal period: Secondary | ICD-10-CM | POA: Diagnosis not present

## 2018-06-24 DIAGNOSIS — F330 Major depressive disorder, recurrent, mild: Secondary | ICD-10-CM | POA: Diagnosis not present

## 2018-06-24 MED ORDER — MEDROXYPROGESTERONE ACETATE 10 MG PO TABS: ORAL_TABLET | ORAL | 0 refills | Status: DC

## 2018-06-24 NOTE — Telephone Encounter (Signed)
Spoke with patient. Patient states that she started Provera 10 mg on 06/21/2018. Taking at the same time daily. On 06/23/2018 she began having to change her tampon every hour due to bleeding through for 2-3 hours. Went to bed with a tampon and a pad and did not have to change it at all during the night. Today she has been changing her tampon every 1.5 hours due to bleeding through. Denies any SOB, weakness, fatigue, or dizziness. Patient has a [place] scheduled for 06/26/2018. Advised will review with Dr.Silva and return call.

## 2018-06-24 NOTE — Telephone Encounter (Signed)
Please have patient come to the office to see me.  We may need to change our plan for her care.

## 2018-06-24 NOTE — Telephone Encounter (Signed)
Left message to call Kairah Leoni at [phone]. 
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
## 2018-06-24 NOTE — Telephone Encounter (Signed)
Patient is calling to inform Dr. Quincy Simmonds that she has not stopped bleeding. She stated that she soaked through a tampon in an hour. She stated that she has been taking medroxyprogesterone 10MG  on schedule. She stated that she started the medication on Saturday.

## 2018-06-24 NOTE — Telephone Encounter (Signed)
Spoke with patient. Patient is on her way to the office now for further evaluation with Dr.Silva. Encounter closed.

## 2018-06-25 ENCOUNTER — Other Ambulatory Visit: Payer: Self-pay

## 2018-06-26 ENCOUNTER — Ambulatory Visit (INDEPENDENT_AMBULATORY_CARE_PROVIDER_SITE_OTHER): Payer: BLUE CROSS/BLUE SHIELD | Admitting: Obstetrics and Gynecology

## 2018-06-26 ENCOUNTER — Encounter: Payer: Self-pay | Admitting: Obstetrics and Gynecology

## 2018-06-26 ENCOUNTER — Ambulatory Visit (INDEPENDENT_AMBULATORY_CARE_PROVIDER_SITE_OTHER): Payer: BLUE CROSS/BLUE SHIELD

## 2018-06-26 VITALS — BP 112/78 | HR 88 | Resp 12 | Wt 124.6 lb

## 2018-06-26 DIAGNOSIS — N950 Postmenopausal bleeding: Secondary | ICD-10-CM | POA: Diagnosis not present

## 2018-06-26 DIAGNOSIS — F330 Major depressive disorder, recurrent, mild: Secondary | ICD-10-CM | POA: Diagnosis not present

## 2018-06-26 DIAGNOSIS — N924 Excessive bleeding in the premenopausal period: Secondary | ICD-10-CM | POA: Diagnosis not present

## 2018-06-26 MED ORDER — DOXYCYCLINE HYCLATE 100 MG PO CAPS: 100.0000 mg | ORAL_CAPSULE | Freq: Two times a day (BID) | ORAL | 0 refills | Status: DC

## 2018-06-26 NOTE — Progress Notes (Signed)
Encounter reviewed by Dr. Brook Amundson C. Silva.  
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
## 2018-06-27 ENCOUNTER — Telehealth: Payer: Self-pay

## 2018-06-27 MED ORDER — NORETHINDRONE ACETATE 5 MG PO TABS: ORAL_TABLET | ORAL | 0 refills | Status: DC

## 2018-06-27 NOTE — Telephone Encounter (Signed)
Spoke with patient. Advised of results as seen below from [place]. Patient verbalizes understanding. Patient states that she took Provera 20 mg on 4/28, skipped on 4/29, took on 4/30, and 5/1. Reports she felt worse last night after her OV than she has through this whole process. States that she is changing an overnight pad every 1.5 hours due to bleeding through. Is passing quarter sized or smaller clots. Feels very worn down and fatigued. Denies any SOB, weakness, light headedness, or dizziness. Hgb was 13.0 on 06/20/2018. Patient has concerns about feeling worse and how long she should wait to see results from the Provera before being seen again in the office. Advised will review with Dr.Jertson and return call.

## 2018-06-27 NOTE — Telephone Encounter (Signed)
Spoke with patient. Advised of message as seen below from [place]. Patient verbalizes understanding. Rx for aygestin 5 mg TID until all bleeding stops, decrease to 5 mg BID. #60, no refills sent to pharmacy on file. 2 week follow up scheduled with Dr.Silva for 07/10/2018 at 10:30 am. Patient is agreeable to date and time and prefers to see Dr.Silva for continuity of care for this problem. Bleeding precautions given. Patient is agreeable and will go to the ER if needed.  Routing to provider and will close encounter.

## 2018-06-27 NOTE — Telephone Encounter (Signed)
Start aygestin 5 mg TID until all bleeding stops, then she can go down to 5 mg BID. #60, no refills. Have her come in to see Dr Sabra Heck or Dr Quincy Simmonds in 2 weeks.  If her bleeding doesn't slow down by tomorrow or if she is getting light headed or dizzy she needs to go to [facility] ER.

## 2018-06-27 NOTE — Telephone Encounter (Signed)
-----   Message from Nunzio Cobbs, MD sent at 06/27/2018 11:00 AM EDT ----- Please let patient know that her office dilation and curettage specimen showed inactive endometrium without atypia or malignancy.  Her sonohysterogram yesterday showed some irregular borders to the endometrium and some clot.  She has now had two samplings of the endometrium and no polyp or pathology was appreciated.   I recommend she complete the Provera 20 mg daily for 10 days, and then wean down to 10 mg daily for another 10 days. She likely has enough Provera to complete this.  She will need a follow up appointment with Dr. Sabra Heck or me to have a plan for ongoing control of her bleeding.  Options could include a daily oral progesterone or a Mirena IUD.  If her bleeding is not controlled, hospital dilation and curettage or hysterectomy are options.   Cc - Dr. Sabra Heck

## 2018-07-01 DIAGNOSIS — F330 Major depressive disorder, recurrent, mild: Secondary | ICD-10-CM | POA: Diagnosis not present

## 2018-07-03 DIAGNOSIS — F411 Generalized anxiety disorder: Secondary | ICD-10-CM | POA: Diagnosis not present

## 2018-07-03 DIAGNOSIS — F3342 Major depressive disorder, recurrent, in full remission: Secondary | ICD-10-CM | POA: Diagnosis not present

## 2018-07-07 DIAGNOSIS — R194 Change in bowel habit: Secondary | ICD-10-CM | POA: Diagnosis not present

## 2018-07-08 DIAGNOSIS — F330 Major depressive disorder, recurrent, mild: Secondary | ICD-10-CM | POA: Diagnosis not present

## 2018-07-10 ENCOUNTER — Ambulatory Visit: Payer: BLUE CROSS/BLUE SHIELD | Admitting: Obstetrics and Gynecology

## 2018-07-11 ENCOUNTER — Ambulatory Visit: Payer: BLUE CROSS/BLUE SHIELD | Admitting: Obstetrics and Gynecology

## 2018-07-14 ENCOUNTER — Encounter: Payer: Self-pay | Admitting: Obstetrics & Gynecology

## 2018-07-14 ENCOUNTER — Other Ambulatory Visit: Payer: Self-pay

## 2018-07-14 ENCOUNTER — Ambulatory Visit: Payer: BLUE CROSS/BLUE SHIELD | Admitting: Obstetrics & Gynecology

## 2018-07-14 VITALS — BP 102/70 | HR 100 | Temp 97.2°F | Ht 64.0 in | Wt 127.0 lb

## 2018-07-14 DIAGNOSIS — R5383 Other fatigue: Secondary | ICD-10-CM

## 2018-07-14 DIAGNOSIS — N924 Excessive bleeding in the premenopausal period: Secondary | ICD-10-CM | POA: Diagnosis not present

## 2018-07-14 DIAGNOSIS — D649 Anemia, unspecified: Secondary | ICD-10-CM
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
## 2018-07-15 DIAGNOSIS — F330 Major depressive disorder, recurrent, mild: Secondary | ICD-10-CM | POA: Diagnosis not present

## 2018-07-21 DIAGNOSIS — F330 Major depressive disorder, recurrent, mild: Secondary | ICD-10-CM | POA: Diagnosis not present

## 2018-07-23 ENCOUNTER — Telehealth: Payer: Self-pay | Admitting: Obstetrics & Gynecology

## 2018-07-23 DIAGNOSIS — R102 Pelvic and perineal pain: Secondary | ICD-10-CM | POA: Diagnosis not present

## 2018-07-23 DIAGNOSIS — R3911 Hesitancy of micturition: Secondary | ICD-10-CM | POA: Diagnosis not present

## 2018-07-23 DIAGNOSIS — M6281 Muscle weakness (generalized): Secondary | ICD-10-CM | POA: Diagnosis not present

## 2018-07-23 DIAGNOSIS — R197 Diarrhea, unspecified: Secondary | ICD-10-CM | POA: Diagnosis not present

## 2018-07-23 MED ORDER — NORETHINDRONE ACETATE 5 MG PO TABS: ORAL_TABLET | ORAL | 0 refills | Status: DC

## 2018-07-23 NOTE — Telephone Encounter (Signed)
Spoke with patient.  Taking Aygestin 5mg  q other day since Saturday.  Flow has been increasing with tapering of Aygestin. Flow varies with activity, saturates regular pad after sitting for approximately 1 hr. Saturates at night, wears overnight pad and uses towel for protection. Reports fatigue, increased cramping, backache, migraines and pelvic pressure. Denies fever/chills, SOB, weakness, dizziness or lightheadedness. Patient is requesting to proceed with hysteroscopy with polyp resection and D&C discussed at [facility] on 5/18 with Dr. Sabra Heck.   Has 8 Aygestin 5 mg tabs left.   Advised I will review with Dr. Sabra Heck and return call with recommendations, patient agreeable.   Dr. Sabra Heck -please review and advise.

## 2018-07-23 NOTE — Addendum Note (Signed)
Addended by: Huey Romans on: 07/23/2018 03:17 PM   Modules accepted: Orders

## 2018-07-23 NOTE — Telephone Encounter (Signed)
Patient states "I am still bleeding and would like to schedule the D&C procedure".

## 2018-07-23 NOTE — Telephone Encounter (Signed)
Call to patient. Advised surgery confirmed for 07-29-18 at [facility] at 1100.  Surgery instructions and Covid 19 recommendations reviewed.  Advised Dr Sabra Heck instructed to increase Aygestin to BID until surgery. Will refill current prescription. Instructed to call back if bleeding does not improve.   Final review to Dr Sabra Heck. Encounter closed.

## 2018-07-23 NOTE — Telephone Encounter (Signed)
Call to patient. Advised Dr Sabra Heck would like to schedule D&C next week. patient is agreeable.  Will schedule and call patient back once confirmed with hospital  (Covid 19 restrictions currently in place at hospital.)

## 2018-07-24 ENCOUNTER — Encounter (HOSPITAL_BASED_OUTPATIENT_CLINIC_OR_DEPARTMENT_OTHER): Payer: Self-pay | Admitting: *Deleted

## 2018-07-24 ENCOUNTER — Other Ambulatory Visit: Payer: Self-pay

## 2018-07-24 NOTE — Progress Notes (Signed)
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

## 2018-07-25 ENCOUNTER — Other Ambulatory Visit: Payer: Self-pay | Admitting: Obstetrics & Gynecology

## 2018-07-25 ENCOUNTER — Other Ambulatory Visit (HOSPITAL_COMMUNITY)
Admission: RE | Admit: 2018-07-25 | Discharge: 2018-07-25 | Disposition: A | Discharge status: Home / Self Care | Payer: BC Managed Care – PPO | Source: Ambulatory Visit | Attending: Nurse Practitioner | Admitting: Nurse Practitioner

## 2018-07-25 DIAGNOSIS — Z1159 Encounter for screening for other viral diseases: Secondary | ICD-10-CM | POA: Diagnosis not present

## 2018-07-26 LAB — NOVEL CORONAVIRUS, NAA (HOSP ORDER, SEND-OUT TO REF LAB; TAT 18-24 HRS): SARS-CoV-2, NAA: NOT DETECTED

## 2018-07-28 NOTE — Progress Notes (Signed)
Pt upset after discussing schedule change regarding surgical start time. This pushes her surgery back by two hours and due to her diabetes she does not want to be NPO for 14 hours. Discussed situation with Dr. Valma Cava made aware, states pt may have water or gatorade the morning of surgery up until 0830. Stressed to pt she is not allowed to eat after midnight and the cut off for fluids is 0830. Pt agreed

## 2018-07-28 NOTE — Progress Notes (Signed)
Attempted to complete covid screening, left message for pt to call us today.

## 2018-07-28 NOTE — Progress Notes (Signed)
SPOKE W/  Patient- Denise Kerr      SCREENING SYMPTOMS OF COVID 19:   COUGH--No  RUNNY NOSE---No   SORE THROAT---No  NASAL CONGESTION----No  SNEEZING----No  SHORTNESS OF BREATH---No  DIFFICULTY BREATHING---No  TEMP >100.0 -----No  UNEXPLAINED BODY ACHES------No  CHILLS -------- No  HEADACHES ---------No  LOSS OF SMELL/ TASTE --------No     HAVE YOU OR ANY FAMILY MEMBER TRAVELLED PAST 14 DAYS OUT OF [place]  STATE----No COUNTRY----No  HAVE YOU OR ANY FAMILY MEMBER BEEN EXPOSED TO ANYONE WITH COVID 19? No  Patient called surgery center back, completed COVID screening, provided patient with negative results. Lyndel Pleasure, RN

## 2018-07-29 ENCOUNTER — Encounter (HOSPITAL_BASED_OUTPATIENT_CLINIC_OR_DEPARTMENT_OTHER): Payer: Self-pay

## 2018-07-29 ENCOUNTER — Encounter (HOSPITAL_COMMUNITY): Payer: Self-pay | Admitting: Emergency Medicine

## 2018-07-29 ENCOUNTER — Ambulatory Visit (HOSPITAL_BASED_OUTPATIENT_CLINIC_OR_DEPARTMENT_OTHER): Payer: BC Managed Care – PPO | Admitting: Anesthesiology

## 2018-07-29 ENCOUNTER — Other Ambulatory Visit: Payer: Self-pay

## 2018-07-29 ENCOUNTER — Encounter (HOSPITAL_BASED_OUTPATIENT_CLINIC_OR_DEPARTMENT_OTHER)
Admission: RE | Disposition: A | Discharge status: Home / Self Care | Payer: Self-pay | Source: Home / Self Care | Attending: Obstetrics & Gynecology

## 2018-07-29 ENCOUNTER — Telehealth: Payer: Self-pay | Admitting: Obstetrics and Gynecology

## 2018-07-29 ENCOUNTER — Emergency Department (HOSPITAL_COMMUNITY)
Admission: EM | Admit: 2018-07-29 | Discharge: 2018-07-30 | Disposition: A | Discharge status: Home / Self Care | Payer: BC Managed Care – PPO | Source: Home / Self Care | Attending: Emergency Medicine | Admitting: Emergency Medicine

## 2018-07-29 ENCOUNTER — Ambulatory Visit (HOSPITAL_BASED_OUTPATIENT_CLINIC_OR_DEPARTMENT_OTHER)
Admission: RE | Admit: 2018-07-29 | Discharge: 2018-07-29 | Disposition: A | Discharge status: Home / Self Care | Payer: BC Managed Care – PPO | Attending: Obstetrics & Gynecology | Admitting: Obstetrics & Gynecology

## 2018-07-29 DIAGNOSIS — R339 Retention of urine, unspecified: Secondary | ICD-10-CM | POA: Insufficient documentation

## 2018-07-29 DIAGNOSIS — N3001 Acute cystitis with hematuria: Secondary | ICD-10-CM | POA: Insufficient documentation

## 2018-07-29 DIAGNOSIS — Z79899 Other long term (current) drug therapy: Secondary | ICD-10-CM | POA: Insufficient documentation

## 2018-07-29 DIAGNOSIS — N950 Postmenopausal bleeding: Secondary | ICD-10-CM | POA: Insufficient documentation

## 2018-07-29 DIAGNOSIS — Z79890 Hormone replacement therapy (postmenopausal): Secondary | ICD-10-CM | POA: Insufficient documentation

## 2018-07-29 DIAGNOSIS — I341 Nonrheumatic mitral (valve) prolapse: Secondary | ICD-10-CM | POA: Insufficient documentation

## 2018-07-29 DIAGNOSIS — E7800 Pure hypercholesterolemia, unspecified: Secondary | ICD-10-CM | POA: Diagnosis not present

## 2018-07-29 DIAGNOSIS — N840 Polyp of corpus uteri: Secondary | ICD-10-CM | POA: Insufficient documentation

## 2018-07-29 DIAGNOSIS — E119 Type 2 diabetes mellitus without complications: Secondary | ICD-10-CM | POA: Insufficient documentation

## 2018-07-29 DIAGNOSIS — Z7984 Long term (current) use of oral hypoglycemic drugs: Secondary | ICD-10-CM | POA: Insufficient documentation

## 2018-07-29 DIAGNOSIS — Z87891 Personal history of nicotine dependence: Secondary | ICD-10-CM | POA: Insufficient documentation

## 2018-07-29 DIAGNOSIS — F419 Anxiety disorder, unspecified: Secondary | ICD-10-CM | POA: Insufficient documentation

## 2018-07-29 DIAGNOSIS — E039 Hypothyroidism, unspecified: Secondary | ICD-10-CM | POA: Insufficient documentation

## 2018-07-29 DIAGNOSIS — F330 Major depressive disorder, recurrent, mild: Secondary | ICD-10-CM | POA: Diagnosis not present

## 2018-07-29 HISTORY — PX: DILATATION & CURETTAGE/HYSTEROSCOPY WITH MYOSURE: SHX6511

## 2018-07-29 LAB — CBC
HCT: 35.2 % — ABNORMAL LOW (ref 36.0–46.0)
Hemoglobin: 11.1 g/dL — ABNORMAL LOW (ref 12.0–15.0)
MCH: 29.8 pg (ref 26.0–34.0)
MCHC: 31.5 g/dL (ref 30.0–36.0)
MCV: 94.4 fL (ref 80.0–100.0)
Platelets: 220 10*3/uL (ref 150–400)
RBC: 3.73 MIL/uL — ABNORMAL LOW (ref 3.87–5.11)
RDW: 12.7 % (ref 11.5–15.5)
WBC: 6.2 10*3/uL (ref 4.0–10.5)
nRBC: 0 % (ref 0.0–0.2)

## 2018-07-29 LAB — GLUCOSE, CAPILLARY
Glucose-Capillary: 125 mg/dL — ABNORMAL HIGH (ref 70–99)
Glucose-Capillary: 116 mg/dL — ABNORMAL HIGH (ref 70–99)

## 2018-07-29 LAB — BASIC METABOLIC PANEL
Anion gap: 6 (ref 5–15)
BUN: 15 mg/dL (ref 6–20)
CO2: 25 mmol/L (ref 22–32)
Calcium: 8.5 mg/dL — ABNORMAL LOW (ref 8.9–10.3)
Chloride: 108 mmol/L (ref 98–111)
Creatinine, Ser: 0.77 mg/dL (ref 0.44–1.00)
GFR calc Af Amer: >60 mL/min (ref >60)
GFR calc non Af Amer: >60 mL/min (ref >60)
Glucose, Bld: 140 mg/dL — ABNORMAL HIGH (ref 70–99)
Potassium: 4.3 mmol/L (ref 3.5–5.1)
Sodium: 139 mmol/L (ref 135–145)

## 2018-07-29 LAB — POCT PREGNANCY, URINE: Preg Test, Ur: NEGATIVE

## 2018-07-29 SURGERY — DILATATION & CURETTAGE/HYSTEROSCOPY WITH MYOSURE
Anesthesia: General | Site: Uterus

## 2018-07-29 MED ORDER — SODIUM CHLORIDE 0.9 % IR SOLN
Status: DC | PRN
  Administered 2018-07-29: 1000 mL

## 2018-07-29 MED ORDER — NORETHIN-ETH ESTRAD-FE BIPHAS 1 MG-10 MCG / 10 MCG PO TABS: 1.0000 | ORAL_TABLET | Freq: Every day | ORAL | 3 refills | Status: DC

## 2018-07-29 MED ORDER — HYDROCODONE-ACETAMINOPHEN 5-325 MG PO TABS: 1.0000 | ORAL_TABLET | ORAL | 0 refills | Status: DC | PRN

## 2018-07-29 MED ORDER — ACETAMINOPHEN 500 MG PO TABS
ORAL_TABLET | ORAL | Status: AC
  Filled 2018-07-29: qty 2

## 2018-07-29 MED ORDER — LIDOCAINE-EPINEPHRINE 1 %-1:100000 IJ SOLN
INTRAMUSCULAR | Status: DC | PRN
  Administered 2018-07-29: 10 mL

## 2018-07-29 MED ORDER — LACTATED RINGERS IV SOLN
INTRAVENOUS | Status: DC
  Administered 2018-07-29 (×2): via INTRAVENOUS
  Filled 2018-07-29: qty 1000

## 2018-07-29 MED ORDER — SILVER NITRATE-POT NITRATE 75-25 % EX MISC
CUTANEOUS | Status: DC | PRN
  Administered 2018-07-29: 1 via TOPICAL

## 2018-07-29 MED ORDER — DEXAMETHASONE SODIUM PHOSPHATE 10 MG/ML IJ SOLN
INTRAMUSCULAR | Status: AC
  Filled 2018-07-29: qty 1

## 2018-07-29 MED ORDER — KETOROLAC TROMETHAMINE 30 MG/ML IJ SOLN
INTRAMUSCULAR | Status: AC
  Filled 2018-07-29: qty 1

## 2018-07-29 MED ORDER — DEXAMETHASONE SODIUM PHOSPHATE 4 MG/ML IJ SOLN
INTRAMUSCULAR | Status: DC | PRN
  Administered 2018-07-29: 4 mg via INTRAVENOUS

## 2018-07-29 MED ORDER — PROPOFOL 10 MG/ML IV BOLUS
INTRAVENOUS | Status: DC | PRN
  Administered 2018-07-29: 50 mg via INTRAVENOUS
  Administered 2018-07-29: 150 mg via INTRAVENOUS

## 2018-07-29 MED ORDER — ACETAMINOPHEN 500 MG PO TABS
1000.0000 mg | ORAL_TABLET | Freq: Once | ORAL | Status: AC
  Administered 2018-07-29: 1000 mg via ORAL
  Filled 2018-07-29: qty 2

## 2018-07-29 MED ORDER — LIDOCAINE HCL URETHRAL/MUCOSAL 2 % EX GEL: 1.0000 "application " | Freq: Once | CUTANEOUS | Status: DC

## 2018-07-29 MED ORDER — MIDAZOLAM HCL 5 MG/5ML IJ SOLN
INTRAMUSCULAR | Status: DC | PRN
  Administered 2018-07-29 (×2): 1 mg via INTRAVENOUS

## 2018-07-29 MED ORDER — FENTANYL CITRATE (PF) 100 MCG/2ML IJ SOLN
25.0000 ug | INTRAMUSCULAR | Status: DC | PRN
  Filled 2018-07-29: qty 1

## 2018-07-29 MED ORDER — FENTANYL CITRATE (PF) 100 MCG/2ML IJ SOLN
INTRAMUSCULAR | Status: AC
  Filled 2018-07-29: qty 2

## 2018-07-29 MED ORDER — ONDANSETRON HCL 4 MG/2ML IJ SOLN
INTRAMUSCULAR | Status: DC | PRN
  Administered 2018-07-29: 4 mg via INTRAVENOUS

## 2018-07-29 MED ORDER — PROPOFOL 10 MG/ML IV BOLUS
INTRAVENOUS | Status: AC
  Filled 2018-07-29: qty 20

## 2018-07-29 MED ORDER — KETOROLAC TROMETHAMINE 30 MG/ML IJ SOLN
INTRAMUSCULAR | Status: DC | PRN
  Administered 2018-07-29: 30 mg via INTRAVENOUS

## 2018-07-29 MED ORDER — ONDANSETRON HCL 4 MG/2ML IJ SOLN
INTRAMUSCULAR | Status: AC
  Filled 2018-07-29: qty 2

## 2018-07-29 MED ORDER — FENTANYL CITRATE (PF) 100 MCG/2ML IJ SOLN
INTRAMUSCULAR | Status: DC | PRN
  Administered 2018-07-29 (×2): 50 ug via INTRAVENOUS

## 2018-07-29 MED ORDER — MIDAZOLAM HCL 2 MG/2ML IJ SOLN
INTRAMUSCULAR | Status: AC
  Filled 2018-07-29: qty 2

## 2018-07-29 MED ORDER — FERRIC SUBSULFATE SOLN
Status: DC | PRN
  Administered 2018-07-29: 1 via TOPICAL

## 2018-07-29 MED ORDER — LIDOCAINE HCL (CARDIAC) PF 100 MG/5ML IV SOSY
PREFILLED_SYRINGE | INTRAVENOUS | Status: DC | PRN
  Administered 2018-07-29: 100 mg via INTRAVENOUS

## 2018-07-29 SURGICAL SUPPLY — 25 items
BIPOLAR CUTTING LOOP 21FR (ELECTRODE)
CANISTER SUCT 3000ML PPV (MISCELLANEOUS) ×4 IMPLANT
CATH ROBINSON RED A/P 16FR (CATHETERS) ×2 IMPLANT
COVER WAND RF STERILE (DRAPES) ×2 IMPLANT
DEVICE MYOSURE LITE (MISCELLANEOUS) IMPLANT
DEVICE MYOSURE REACH (MISCELLANEOUS) IMPLANT
DILATOR CANAL MILEX (MISCELLANEOUS) IMPLANT
GAUZE 4X4 16PLY RFD (DISPOSABLE) ×2 IMPLANT
GLOVE BIOGEL PI IND STRL 7.0 (GLOVE) ×1 IMPLANT
GLOVE BIOGEL PI INDICATOR 7.0 (GLOVE) ×1
GLOVE ECLIPSE 6.5 STRL STRAW (GLOVE) ×4 IMPLANT
GOWN STRL REUS W/ TWL LRG LVL3 (GOWN DISPOSABLE) ×1 IMPLANT
GOWN STRL REUS W/ TWL XL LVL3 (GOWN DISPOSABLE) ×1 IMPLANT
GOWN STRL REUS W/TWL LRG LVL3 (GOWN DISPOSABLE) ×2
GOWN STRL REUS W/TWL XL LVL3 (GOWN DISPOSABLE) ×2
IV NS IRRIG 3000ML ARTHROMATIC (IV SOLUTION) ×4 IMPLANT
KIT PROCEDURE FLUENT (KITS) ×3 IMPLANT
KIT TURNOVER CYSTO (KITS) ×2 IMPLANT
LOOP CUTTING BIPOLAR 21FR (ELECTRODE) IMPLANT
PACK VAGINAL MINOR WOMEN LF (CUSTOM PROCEDURE TRAY) ×2 IMPLANT
PAD OB MATERNITY 4.3X12.25 (PERSONAL CARE ITEMS) ×2 IMPLANT
SEAL CERVICAL OMNI LOK (ABLATOR) IMPLANT
SEAL ROD LENS SCOPE MYOSURE (ABLATOR) ×2 IMPLANT
TOWEL OR 17X26 10 PK STRL BLUE (TOWEL DISPOSABLE) ×4 IMPLANT
WATER STERILE IRR 500ML POUR (IV SOLUTION) ×2 IMPLANT

## 2018-07-29 NOTE — Anesthesia Postprocedure Evaluation (Signed)
Anesthesia Post Note  Patient: Cyrilla E Fiola  Procedure(s) Performed: DILATATION & CURETTAGE/HYSTEROSCOPY WITH MYOSURE (N/A Uterus)     Patient location during evaluation: PACU Anesthesia Type: General Level of consciousness: awake and alert Pain management: pain level controlled Vital Signs Assessment: post-procedure vital signs reviewed and stable Respiratory status: spontaneous breathing, nonlabored ventilation and respiratory function stable Cardiovascular status: blood pressure returned to baseline and stable Postop Assessment: no apparent nausea or vomiting Anesthetic complications: no    Last Vitals:  Vitals:   07/29/18 1445 07/29/18 1500  BP: 123/76 123/66  Pulse: 100 (!) 192  Resp: 13 13  Temp: 36.9 C   SpO2: 100% 100%    Last Pain:  Vitals:   07/29/18 1500  TempSrc:   PainSc: 0-No pain                 Audry Pili

## 2018-07-29 NOTE — Discharge Instructions (Addendum)
Post-surgical Instructions, Outpatient Surgery  You may expect to feel dizzy, weak, and drowsy for as long as 24 hours after receiving the medicine that made you sleep (anesthetic). For the first 24 hours after your surgery:    Do not drive a car, ride a bicycle, participate in physical activities, or take public transportation until you are done taking narcotic pain medicines or as directed by Dr. Sabra Heck.   Do not drink alcohol or take tranquilizers.   Do not take medicine that has not been prescribed by your physicians.   Do not sign important papers or make important decisions while on narcotic pain medicines.   Have a responsible person with you.   Expect some bleeding.  You may even have some small clots.  As long as it is not heavier than it has been, this is ok to monitor.  PAIN MANAGEMENT  Motrin 800mg .  (This is the same as 4-200mg  over the counter tablets of Motrin or ibuprofen.)  You may take this every eight hours or as needed for cramping.    Vicodin 5/325mg .  For more severe pain, take one or two tablets every four to six hours as needed for pain control.  (Remember that narcotic pain medications increase your risk of constipation.  If this becomes a problem, you may take an over the counter stool softener like Colace 100mg  up to four times a day.)  DO'S AND DON'T'S  Do not take a tub bath for one week.  You may shower on the first day after your surgery  Do not do any heavy lifting for one to two weeks.  This increases the chance of bleeding.  Do move around as you feel able.  Stairs are fine.  You may begin to exercise again as you feel able.  Do not lift any weights for two weeks.  Do not put anything in the vagina for two weeks--no tampons, intercourse, or douching.    REGULAR MEDIATIONS/VITAMINS:  You may restart all of your regular medications as prescribed.  You may restart all of your vitamins as you normally take them.    PLEASE CALL OR SEEK MEDICAL CARE  IF:  You have persistent nausea and vomiting.   You have trouble eating or drinking.   You have an oral temperature above 100.5.   You have constipation that is not helped by adjusting diet or increasing fluid intake. Pain medicines are a common cause of constipation.   You have redness or drainage from your incision(s) or there is increasing pain or tenderness near or in the surgical site.    Post Anesthesia Home Care Instructions  Activity: Get plenty of rest for the remainder of the day. A responsible individual must stay with you for 24 hours following the procedure.  For the next 24 hours, DO NOT: -Drive a car -Paediatric nurse -Drink alcoholic beverages -Take any medication unless instructed by your physician -Make any legal decisions or sign important papers.  Meals: Start with liquid foods such as gelatin or soup. Progress to regular foods as tolerated. Avoid greasy, spicy, heavy foods. If nausea and/or vomiting occur, drink only clear liquids until the nausea and/or vomiting subsides. Call your physician if vomiting continues.  Special Instructions/Symptoms: Your throat may feel dry or sore from the anesthesia or the breathing tube placed in your throat during surgery. If this causes discomfort, gargle with warm salt water. The discomfort should disappear within 24 hours.  If you had a scopolamine patch placed behind your  ear for the management of post- operative nausea and/or vomiting:  1. The medication in the patch is effective for 72 hours, after which it should be removed.  Wrap patch in a tissue and discard in the trash. Wash hands thoroughly with soap and water. 2. You may remove the patch earlier than 72 hours if you experience unpleasant side effects which may include dry mouth, dizziness or visual disturbances. 3. Avoid touching the patch. Wash your hands with soap and water after contact with the patch.     Ibuprofen may be taken after 8:30 pm if needed  And  Tylenol after 6 pm if needed

## 2018-07-29 NOTE — Telephone Encounter (Signed)
The patient called. She had a D&C today, felt like she didn't void well after the surgery. Feels severe pain when she tries to void. Voiding small amounts, slow stream. She tried AZO, no help. I advised her to go to the ER, I'm concerned that her bladder is overdistended.   CC: Dr Sabra Heck.
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
## 2018-07-29 NOTE — Anesthesia Procedure Notes (Signed)
Procedure Name: LMA Insertion Date/Time: 07/29/2018 1:57 PM Performed by: Bufford Spikes, CRNA Pre-anesthesia Checklist: Patient identified, Emergency Drugs available, Suction available and Patient being monitored Patient Re-evaluated:Patient Re-evaluated prior to induction Oxygen Delivery Method: Circle system utilized Preoxygenation: Pre-oxygenation with 100% oxygen Induction Type: IV induction Ventilation: Mask ventilation without difficulty LMA: LMA inserted LMA Size: 4.0 Number of attempts: 1 Airway Equipment and Method: Bite block Placement Confirmation: positive ETCO2 Tube secured with: Tape Dental Injury: Teeth and Oropharynx as per pre-operative assessment

## 2018-07-29 NOTE — ED Triage Notes (Signed)
Pt reports having extreme pain with urination post-op D&C and Polipectomy  . [facility] Boast OBGYN on call with Dr Neomia Glass and was told to coming to ED

## 2018-07-29 NOTE — Anesthesia Preprocedure Evaluation (Addendum)
Anesthesia Evaluation  Patient identified by MRN, date of birth, ID band Patient awake    Reviewed: Allergy & Precautions, NPO status , Patient's Chart, lab work & pertinent test results  Airway Mallampati: I  TM Distance: >3 FB Neck ROM: Full    Dental no notable dental hx. (+) Teeth Intact, Dental Advisory Given   Pulmonary neg pulmonary ROS, former smoker,    Pulmonary exam normal breath sounds clear to auscultation       Cardiovascular Normal cardiovascular exam+ Valvular Problems/Murmurs MVP  Rhythm:Regular Rate:Normal     Neuro/Psych PSYCHIATRIC DISORDERS Anxiety Depression negative neurological ROS     GI/Hepatic negative GI ROS, Neg liver ROS,   Endo/Other  diabetes, Oral Hypoglycemic AgentsHypothyroidism   Renal/GU negative Renal ROS  negative genitourinary   Musculoskeletal negative musculoskeletal ROS (+)   Abdominal   Peds negative pediatric ROS (+)  Hematology negative hematology ROS (+)   Anesthesia Other Findings   Reproductive/Obstetrics negative OB ROS                           Anesthesia Physical Anesthesia Plan  ASA: II  Anesthesia Plan: General   Post-op Pain Management:    Induction: Intravenous  PONV Risk Score and Plan: 3 and Midazolam, Dexamethasone and Ondansetron  Airway Management Planned: LMA  Additional Equipment:   Intra-op Plan:   Post-operative Plan: Extubation in OR  Informed Consent: I have reviewed the patients History and Physical, chart, labs and discussed the procedure including the risks, benefits and alternatives for the proposed anesthesia with the patient or authorized representative who has indicated his/her understanding and acceptance.     Dental advisory given  Plan Discussed with: CRNA  Anesthesia Plan Comments:         Anesthesia Quick Evaluation

## 2018-07-29 NOTE — Op Note (Signed)
07/29/2018  2:37 PM  PATIENT:  Denise Kerr  48 y.o. female  PRE-OPERATIVE DIAGNOSIS:  PMB, endometrial polyp  POST-OPERATIVE DIAGNOSIS:  PMB, thickened and irregular endometrium  PROCEDURE:  Procedure(s): DILATATION & CURETTAGE/HYSTEROSCOPY WITH MYOSURE  SURGEON:  Megan Salon  ASSISTANTS: OR staff   ANESTHESIA:   general  ESTIMATED BLOOD LOSS: 50 mL  BLOOD ADMINISTERED:none   FLUIDS: 1000cc  UOP: 25cc clear UOP  SPECIMEN:  Endometrial curettings  DISPOSITION OF SPECIMEN:  PATHOLOGY  FINDINGS: irregular appearing endometrium  DESCRIPTION OF OPERATION: Patient was taken to the operating room.  She is placed in the supine position. SCDs were on her lower extremities and functioning properly. General anesthesia with an LMA was administered without difficulty. Dr. Lynda Rainwater, anesthesia, oversaw case.  Legs were then placed in the Halfway in the low lithotomy position. The legs were lifted to the high lithotomy position and the Betadine prep was used on the inner thighs perineum and vagina x3. Patient was draped in a normal standard fashion. An in and out catheterization with a red rubber Foley catheter was performed. Approximately 25 cc of clear urine was noted. A bivalve speculum was placed the vagina. The anterior lip of the cervix was grasped with single-tooth tenaculum.  A paracervical block of 1% lidocaine mixed one-to-one with epinephrine (1:100,000 units).  10 cc was used total. The cervix is dilated up to #21 [facility] dilators. The endometrial cavity sounded to 9 cm.   A 2.9 millimeter diagnostic hysteroscope was obtained. Normal saline was used as a hysteroscopic fluid. The hysteroscope was advanced through the endocervical canal into the endometrial cavity. The tubal ostia were noted bilaterally. Additional findings included irregular appearing endometrium.  There was not an obvious polyp present.  The hysteroscope was removed. A #1 toothed curette was used  to curette the cavity until rough gritty texture was noted in all quadrants. With revisualization of the hysteroscope, there was no longer any abnormal findings.  A second curetting was then performed.  At this point no other procedure was needed and this procedure was ended. The hysteroscope was removed. The fluid deficit was 250 cc. The tenaculum was removed from the anterior lip of the cervix. The speculum was removed from the vagina. The prep was cleansed of the patient's skin. The legs are positioned back in the supine position. Sponge, lap, needle, initially counts were correct x2. Patient was taken to recovery in stable condition.  COUNTS:  YES  PLAN OF CARE: Transfer to PACU
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
## 2018-07-30 ENCOUNTER — Ambulatory Visit (INDEPENDENT_AMBULATORY_CARE_PROVIDER_SITE_OTHER): Payer: BC Managed Care – PPO | Admitting: Obstetrics and Gynecology

## 2018-07-30 ENCOUNTER — Encounter: Payer: Self-pay | Admitting: Obstetrics and Gynecology

## 2018-07-30 VITALS — BP 110/68 | HR 100 | Temp 98.1°F | Wt 131.8 lb

## 2018-07-30 DIAGNOSIS — N319 Neuromuscular dysfunction of bladder, unspecified: Secondary | ICD-10-CM

## 2018-07-30 DIAGNOSIS — N3000 Acute cystitis without hematuria: Secondary | ICD-10-CM | POA: Diagnosis not present

## 2018-07-30 LAB — URINALYSIS, ROUTINE W REFLEX MICROSCOPIC
Bacteria, UA: NONE SEEN
Bilirubin Urine: NEGATIVE
Glucose, UA: >=500 mg/dL — AB
Hgb urine dipstick: NEGATIVE
Ketones, ur: 20 mg/dL — AB
Leukocytes,Ua: NEGATIVE
Nitrite: POSITIVE — AB
Protein, ur: NEGATIVE mg/dL
Specific Gravity, Urine: 1.012 (ref 1.005–1.030)
pH: 7 (ref 5.0–8.0)

## 2018-07-30 LAB — URINE CULTURE: Culture: NO GROWTH

## 2018-07-30 MED ORDER — SULFAMETHOXAZOLE-TRIMETHOPRIM 800-160 MG PO TABS: 1.0000 | ORAL_TABLET | Freq: Two times a day (BID) | ORAL | 0 refills | Status: DC

## 2018-07-30 MED ORDER — CEPHALEXIN 500 MG PO CAPS
500.0000 mg | ORAL_CAPSULE | Freq: Once | ORAL | Status: AC
  Administered 2018-07-30: 500 mg via ORAL
  Filled 2018-07-30: qty 1

## 2018-07-30 MED ORDER — CEPHALEXIN 500 MG PO CAPS: 500.0000 mg | ORAL_CAPSULE | Freq: Four times a day (QID) | ORAL | 0 refills | Status: DC

## 2018-07-30 MED ORDER — PHENAZOPYRIDINE HCL 200 MG PO TABS: 200.0000 mg | ORAL_TABLET | Freq: Three times a day (TID) | ORAL | 0 refills | Status: DC | PRN

## 2018-07-30 NOTE — Discharge Instructions (Signed)
1. Medications: Keflex, usual home medications 2. Treatment: rest, drink plenty of fluids, take medications as prescribed 3. Follow Up: Please followup with your primary doctor in 3 days for discussion of your diagnoses and further evaluation after today's visit; if you do not have a primary care doctor use the resource guide provided to find one; return to the ER for fevers, persistent vomiting, worsening abdominal pain or other concerning symptoms.

## 2018-07-30 NOTE — Telephone Encounter (Signed)
Patient is asking to talk with triage ASAP.

## 2018-07-30 NOTE — Anesthesia Postprocedure Evaluation (Signed)
Anesthesia Post Note  Patient: [name]  Procedure(s) Performed: DILATATION & CURETTAGE/HYSTEROSCOPY WITH MYOSURE (N/A Uterus)     Patient location during evaluation: PACU Anesthesia Type: General Level of consciousness: awake and alert Pain management: pain level controlled Vital Signs Assessment: post-procedure vital signs reviewed and stable Respiratory status: spontaneous breathing, nonlabored ventilation and respiratory function stable Cardiovascular status: blood pressure returned to baseline and stable Postop Assessment: no apparent nausea or vomiting Anesthetic complications: no    Last Vitals:  Vitals:   07/29/18 1500 07/29/18 1635  BP: 123/66 116/75  Pulse: 92 78  Resp: 13 16  Temp:  37.1 C  SpO2: 100% 100%    Last Pain:  Vitals:   07/29/18 1600  TempSrc:   PainSc: 0-No pain   Pain Goal: Patients Stated Pain Goal: 6 (07/29/18 1026)                 Lynda Rainwater

## 2018-07-30 NOTE — ED Notes (Addendum)
Leg bag applied for pt to go home. Pt given crackers to go with her medication to avoid nausea.

## 2018-07-30 NOTE — Telephone Encounter (Signed)
Spoke with patient. S/p D&C 07/29/18. Patient was seen at [facility] ER last night, was unable to void after surgery. Foley catheter was placed, started on cephalexin, was advised referral placed to urology, Dr. Tresa Moore, will be contacted with appt ion 1-2 days. Patient expressed frustrations and concerns with having foley cath, has been up all morning and no sleep. Has not measured output, reports foley is draining. Patient has questions and concerns to discuss with provider. Recommended OV, advised patient Dr. Sabra Heck is not in the office today, can scheduled with covering provider. OV scheduled for this morning at 10:15am with Dr. Talbert Nan. Patient verbalizes understanding and is agreeable.   Routing to provider for final review. Patient is agreeable to disposition. Will close encounter.  Cc: Dr. Sabra Heck

## 2018-07-30 NOTE — Transfer of Care (Addendum)
Immediate Anesthesia Transfer of Care Note  Patient: Denise Kerr  Procedure(s) Performed: Procedure(s) (LRB): DILATATION & CURETTAGE/HYSTEROSCOPY WITH MYOSURE (N/A)  Patient Location: PACU  Anesthesia Type: General  Level of Consciousness: awake, oriented, sedated and patient cooperative  Airway & Oxygen Therapy: Patient Spontanous Breathing and Patient connected to face mask oxygen  Post-op Assessment: Report given to PACU RN and Post -op Vital signs reviewed and stable  Post vital signs: Reviewed and stable  Complications: No apparent anesthesia complications Last Vitals:  Vitals Value Taken Time  BP    Temp    Pulse    Resp    SpO2      Last Pain:  Vitals:   07/29/18 1600  TempSrc:   PainSc: 0-No pain      Patients Stated Pain Goal: 6 (07/29/18 1026)

## 2018-07-30 NOTE — Patient Instructions (Signed)

## 2018-07-30 NOTE — ED Notes (Signed)
Pt given and verbalized understanding of d/c instructions and need for follow up with pcp and urology. Told to return if s/s worsen. No further distress or questions upon ambulation out of department  
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
## 2018-07-31 ENCOUNTER — Ambulatory Visit (INDEPENDENT_AMBULATORY_CARE_PROVIDER_SITE_OTHER): Payer: BC Managed Care – PPO | Admitting: Obstetrics & Gynecology

## 2018-07-31 ENCOUNTER — Telehealth: Payer: Self-pay | Admitting: *Deleted

## 2018-07-31 ENCOUNTER — Other Ambulatory Visit: Payer: Self-pay

## 2018-07-31 ENCOUNTER — Ambulatory Visit: Payer: BLUE CROSS/BLUE SHIELD

## 2018-07-31 VITALS — BP 108/68 | HR 96 | Temp 97.8°F | Wt 133.4 lb

## 2018-07-31 DIAGNOSIS — R339 Retention of urine, unspecified: Secondary | ICD-10-CM
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
## 2018-07-31 NOTE — Telephone Encounter (Signed)
Patient is having problems getting a "weird" patch off her leg that was put on to hold catheter in place.

## 2018-07-31 NOTE — Telephone Encounter (Signed)
Spoke with patient. Discussed using rubbing alcohol to remove patch placed to hold foley tubing in place. Questions answered.   Patient reports she is doing well following her OV this morning. Ran some errands, took a nap and has voided multiple times. Advised I will update Dr. Sabra Heck. Advised to return call to office if any additional questions/concerns  Routing to provider for final review. Patient is agreeable to disposition. Will close encounter.

## 2018-08-01 ENCOUNTER — Encounter (HOSPITAL_BASED_OUTPATIENT_CLINIC_OR_DEPARTMENT_OTHER): Payer: Self-pay | Admitting: Obstetrics & Gynecology

## 2018-08-01 ENCOUNTER — Telehealth: Payer: Self-pay | Admitting: Obstetrics & Gynecology

## 2018-08-01 ENCOUNTER — Telehealth: Payer: Self-pay | Admitting: *Deleted

## 2018-08-01 NOTE — Telephone Encounter (Signed)
If swelling is really improved, ok to start the Loloestrin.  She already has it.  Give precaution for bleeding--filling a maxi pad hourly for 2-3 hours.  It is ok to take some ibuprofen 600 to 800mg  every 6 to 8 hours.  That will help bleeding typically as well.

## 2018-08-01 NOTE — Telephone Encounter (Signed)
07/30/18 final urine culture negative, done at [facility].   Per Dr. Talbert Nan, advise patient of negative results, can stop abx, forward to Dr. Sabra Heck.   Call to patient. Advised as seen above. Patient states she was advised of results in office on 07/31/18, was advised by Dr. Sabra Heck to complete abx as prescribed, has one dose left, will complete today. Patient will see Dr. Sabra Heck for f/u on 08/11/18. Patient verbalizes understanding and is agreeable.   Routing to provider for final review. Patient is agreeable to disposition. Will close encounter.  Cc: Dr. Sabra Heck

## 2018-08-01 NOTE — Telephone Encounter (Signed)
Spoke with patient. She had D & C on 6/2 and seen yesterday in office for urinary retention. Patient doing well with voiding. She had now BM for 4 days until last pm and had a "blow out" and felt much better. She never had much bleeding since D & C. Today is having heavier bleeding. It actually comes in gushes. She feels it accumulates while she is resting and when she gets up and moves around, she feels it gush onto pad and fills it.  This hasn't happened every hour but not sure what to expect--no bleeding last night. She is having some cramping today.Will discuss with Dr.Miller and call her back. Patient denies any fever. Routed to provider

## 2018-08-01 NOTE — Telephone Encounter (Signed)
Spoke with patient and reviewed Dr.Miller's recommendations. Patient states ankle swelling is slightly better, she states will wait to start Loloestrin until Monday--or sooner if ankle swelling improves. She was given bleeding precautions and voiced understanding. She will begin on Ibuprofen as directed by Dr.Miller.

## 2018-08-01 NOTE — Telephone Encounter (Signed)
Patient is having complications after D&C 10/29/21 and would like to speak with a nurse.

## 2018-08-04 DIAGNOSIS — E7849 Other hyperlipidemia: Secondary | ICD-10-CM | POA: Diagnosis not present

## 2018-08-04 DIAGNOSIS — E038 Other specified hypothyroidism: Secondary | ICD-10-CM | POA: Diagnosis not present

## 2018-08-04 DIAGNOSIS — E611 Iron deficiency: Secondary | ICD-10-CM | POA: Diagnosis not present

## 2018-08-04 DIAGNOSIS — E119 Type 2 diabetes mellitus without complications: Secondary | ICD-10-CM | POA: Diagnosis not present

## 2018-08-04 DIAGNOSIS — D6489 Other specified anemias: Secondary | ICD-10-CM | POA: Diagnosis not present

## 2018-08-05 DIAGNOSIS — F330 Major depressive disorder, recurrent, mild: Secondary | ICD-10-CM | POA: Diagnosis not present

## 2018-08-07 DIAGNOSIS — R5382 Chronic fatigue, unspecified: Secondary | ICD-10-CM | POA: Diagnosis not present

## 2018-08-07 DIAGNOSIS — E039 Hypothyroidism, unspecified: Secondary | ICD-10-CM | POA: Diagnosis not present

## 2018-08-07 DIAGNOSIS — D649 Anemia, unspecified: Secondary | ICD-10-CM | POA: Diagnosis not present

## 2018-08-07 DIAGNOSIS — Z1331 Encounter for screening for depression: Secondary | ICD-10-CM | POA: Diagnosis not present

## 2018-08-07 DIAGNOSIS — E119 Type 2 diabetes mellitus without complications: Secondary | ICD-10-CM | POA: Diagnosis not present

## 2018-08-07 DIAGNOSIS — Z0000 Encounter for general adult medical examination without abnormal findings: Secondary | ICD-10-CM | POA: Diagnosis not present

## 2018-08-11 ENCOUNTER — Ambulatory Visit: Payer: BC Managed Care – PPO | Admitting: Obstetrics & Gynecology

## 2018-08-13 ENCOUNTER — Other Ambulatory Visit: Payer: Self-pay

## 2018-08-14 ENCOUNTER — Ambulatory Visit (INDEPENDENT_AMBULATORY_CARE_PROVIDER_SITE_OTHER): Payer: BC Managed Care – PPO | Admitting: Obstetrics & Gynecology

## 2018-08-14 ENCOUNTER — Encounter: Payer: Self-pay | Admitting: Obstetrics & Gynecology

## 2018-08-14 VITALS — BP 96/60 | HR 92 | Temp 97.3°F | Ht 64.0 in | Wt 129.0 lb

## 2018-08-14 DIAGNOSIS — N924 Excessive bleeding in the premenopausal period: Secondary | ICD-10-CM

## 2018-08-14 DIAGNOSIS — R790 Abnormal level of blood mineral: Secondary | ICD-10-CM | POA: Diagnosis not present

## 2018-08-14 DIAGNOSIS — D649 Anemia, unspecified: Secondary | ICD-10-CM
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
## 2018-08-18 ENCOUNTER — Telehealth: Payer: Self-pay | Admitting: Hematology

## 2018-08-18 ENCOUNTER — Telehealth: Payer: Self-pay | Admitting: *Deleted

## 2018-08-18 NOTE — Telephone Encounter (Signed)
Contacted patient per IB msg from Dr.Kale: "Please help set Ms [facility] for labs/clinic f/u and IV Injectafer sometime later next week." Informed patient of message from Dr.Kale and that she will receive a call from scheduling to make appts. Patient verbalized understanding of information.   Patient asked that Dr.Kale be given a message. She repeated it twice. Read it back to her and she agreed with content as written here:  She told Dr.Kale in March that she had taken oral iron in the past and that it had not worked for her due to poor absorption. He prescribed oral iron for her anyway. She has taken it as prescribed. It has not helped. She has poor quality of life due to how she feels with low iron. She would like for him to think outside the box as everyone is different and not everyone responds to treatments the same way.  Informed patient that Dr. Irene Limbo will be sent this message.

## 2018-08-18 NOTE — Telephone Encounter (Signed)
I could not reach patient line disconnected

## 2018-08-19 DIAGNOSIS — F330 Major depressive disorder, recurrent, mild: Secondary | ICD-10-CM | POA: Diagnosis not present

## 2018-08-20 DIAGNOSIS — M62838 Other muscle spasm: Secondary | ICD-10-CM | POA: Diagnosis not present

## 2018-08-20 DIAGNOSIS — M6289 Other specified disorders of muscle: Secondary | ICD-10-CM | POA: Diagnosis not present

## 2018-08-20 DIAGNOSIS — M6281 Muscle weakness (generalized): Secondary | ICD-10-CM | POA: Diagnosis not present

## 2018-08-20 DIAGNOSIS — R102 Pelvic and perineal pain: Secondary | ICD-10-CM | POA: Diagnosis not present

## 2018-08-21 ENCOUNTER — Other Ambulatory Visit: Payer: Self-pay | Admitting: *Deleted

## 2018-08-21 DIAGNOSIS — E611 Iron deficiency: Secondary | ICD-10-CM
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
## 2018-08-22 ENCOUNTER — Inpatient Hospital Stay: Payer: BC Managed Care – PPO | Admitting: Hematology

## 2018-08-22 ENCOUNTER — Other Ambulatory Visit: Payer: Self-pay

## 2018-08-22 ENCOUNTER — Inpatient Hospital Stay: Payer: BC Managed Care – PPO | Attending: Hematology

## 2018-08-22 ENCOUNTER — Inpatient Hospital Stay: Payer: BC Managed Care – PPO

## 2018-08-22 ENCOUNTER — Telehealth: Payer: Self-pay | Admitting: Hematology

## 2018-08-22 VITALS — BP 110/72 | HR 92 | Temp 98.5°F | Resp 18 | Ht 64.0 in | Wt 132.2 lb

## 2018-08-22 VITALS — BP 106/68 | HR 78 | Temp 98.9°F | Resp 18

## 2018-08-22 DIAGNOSIS — Z9049 Acquired absence of other specified parts of digestive tract: Secondary | ICD-10-CM | POA: Insufficient documentation

## 2018-08-22 DIAGNOSIS — K909 Intestinal malabsorption, unspecified: Secondary | ICD-10-CM

## 2018-08-22 DIAGNOSIS — E611 Iron deficiency: Secondary | ICD-10-CM

## 2018-08-22 DIAGNOSIS — D508 Other iron deficiency anemias: Secondary | ICD-10-CM

## 2018-08-22 DIAGNOSIS — Z049 Encounter for examination and observation for unspecified reason: Secondary | ICD-10-CM | POA: Diagnosis not present

## 2018-08-22 DIAGNOSIS — D500 Iron deficiency anemia secondary to blood loss (chronic): Secondary | ICD-10-CM

## 2018-08-22 DIAGNOSIS — E538 Deficiency of other specified B group vitamins: Secondary | ICD-10-CM

## 2018-08-22 LAB — IRON AND TIBC
Iron: 27 ug/dL — ABNORMAL LOW (ref 41–142)
Saturation Ratios: 6 % — ABNORMAL LOW (ref 21–57)
TIBC: 433 ug/dL (ref 236–444)
UIBC: 406 ug/dL — ABNORMAL HIGH (ref 120–384)

## 2018-08-22 LAB — CBC WITH DIFFERENTIAL (CANCER CENTER ONLY)
Abs Immature Granulocytes: 0.02 10*3/uL (ref 0.00–0.07)
Basophils Absolute: 0 10*3/uL (ref 0.0–0.1)
Basophils Relative: 1 %
Eosinophils Absolute: 0 10*3/uL (ref 0.0–0.5)
Eosinophils Relative: 0 %
HCT: 33.6 % — ABNORMAL LOW (ref 36.0–46.0)
Hemoglobin: 10.7 g/dL — ABNORMAL LOW (ref 12.0–15.0)
Immature Granulocytes: 0 %
Lymphocytes Relative: 24 %
Lymphs Abs: 1.5 10*3/uL (ref 0.7–4.0)
MCH: 29.5 pg (ref 26.0–34.0)
MCHC: 31.8 g/dL (ref 30.0–36.0)
MCV: 92.6 fL (ref 80.0–100.0)
Monocytes Absolute: 0.4 10*3/uL (ref 0.1–1.0)
Monocytes Relative: 6 %
Neutro Abs: 4.2 10*3/uL (ref 1.7–7.7)
Neutrophils Relative %: 69 %
Platelet Count: 218 10*3/uL (ref 150–400)
RBC: 3.63 MIL/uL — ABNORMAL LOW (ref 3.87–5.11)
RDW: 12.6 % (ref 11.5–15.5)
WBC Count: 6.1 10*3/uL (ref 4.0–10.5)
nRBC: 0 % (ref 0.0–0.2)

## 2018-08-22 LAB — FERRITIN: Ferritin: <4 ng/mL — ABNORMAL LOW (ref 11–307)

## 2018-08-22 LAB — CMP (CANCER CENTER ONLY)
ALT: 9 U/L (ref 0–44)
AST: 12 U/L — ABNORMAL LOW (ref 15–41)
Albumin: 3.4 g/dL — ABNORMAL LOW (ref 3.5–5.0)
Alkaline Phosphatase: 48 U/L (ref 38–126)
Anion gap: 8 (ref 5–15)
BUN: 14 mg/dL (ref 6–20)
CO2: 24 mmol/L (ref 22–32)
Calcium: 8.3 mg/dL — ABNORMAL LOW (ref 8.9–10.3)
Chloride: 105 mmol/L (ref 98–111)
Creatinine: 1.04 mg/dL — ABNORMAL HIGH (ref 0.44–1.00)
GFR, Est AFR Am: >60 mL/min (ref >60)
GFR, Estimated: >60 mL/min (ref >60)
Glucose, Bld: 198 mg/dL — ABNORMAL HIGH (ref 70–99)
Potassium: 4 mmol/L (ref 3.5–5.1)
Sodium: 137 mmol/L (ref 135–145)
Total Bilirubin: <0.2 mg/dL — ABNORMAL LOW (ref 0.3–1.2)
Total Protein: 6.2 g/dL — ABNORMAL LOW (ref 6.5–8.1)

## 2018-08-22 LAB — FOLATE: Folate: 17.6 ng/mL (ref >5.9)

## 2018-08-22 LAB — VITAMIN B12: Vitamin B-12: 235 pg/mL (ref 180–914)

## 2018-08-22 MED ORDER — CYANOCOBALAMIN 1000 MCG/ML IJ SOLN
1000.0000 ug | INTRAMUSCULAR | Status: DC
  Administered 2018-08-22: 1000 ug via SUBCUTANEOUS

## 2018-08-22 MED ORDER — ACETAMINOPHEN 325 MG PO TABS
650.0000 mg | ORAL_TABLET | Freq: Once | ORAL | Status: AC
  Administered 2018-08-22: 650 mg via ORAL

## 2018-08-22 MED ORDER — LORATADINE 10 MG PO TABS
ORAL_TABLET | ORAL | Status: AC
  Filled 2018-08-22: qty 1

## 2018-08-22 MED ORDER — LORATADINE 10 MG PO TABS
10.0000 mg | ORAL_TABLET | Freq: Once | ORAL | Status: AC
  Administered 2018-08-22: 10 mg via ORAL

## 2018-08-22 MED ORDER — ACETAMINOPHEN 325 MG PO TABS
ORAL_TABLET | ORAL | Status: AC
  Filled 2018-08-22: qty 2

## 2018-08-22 MED ORDER — SODIUM CHLORIDE 0.9 % IV SOLN
750.0000 mg | Freq: Once | INTRAVENOUS | Status: AC
  Administered 2018-08-22: 750 mg via INTRAVENOUS
  Filled 2018-08-22: qty 15

## 2018-08-22 MED ORDER — CYANOCOBALAMIN 1000 MCG/ML IJ SOLN
INTRAMUSCULAR | Status: AC
  Filled 2018-08-22: qty 1

## 2018-08-22 MED ORDER — SODIUM CHLORIDE 0.9 % IV SOLN
Freq: Once | INTRAVENOUS | Status: AC
  Administered 2018-08-22: 10:00:00 via INTRAVENOUS
  Filled 2018-08-22: qty 250

## 2018-08-22 NOTE — Telephone Encounter (Signed)
Per 6/26 los appts were already scheduled.

## 2018-08-22 NOTE — Patient Instructions (Signed)

## 2018-08-26 DIAGNOSIS — F330 Major depressive disorder, recurrent, mild: Secondary | ICD-10-CM | POA: Diagnosis not present

## 2018-09-01 ENCOUNTER — Inpatient Hospital Stay: Payer: BC Managed Care – PPO | Attending: Hematology

## 2018-09-01 ENCOUNTER — Other Ambulatory Visit: Payer: Self-pay

## 2018-09-01 ENCOUNTER — Telehealth: Payer: Self-pay | Admitting: Obstetrics & Gynecology

## 2018-09-01 VITALS — BP 102/77 | HR 91 | Temp 98.2°F | Resp 18

## 2018-09-01 DIAGNOSIS — K909 Intestinal malabsorption, unspecified: Secondary | ICD-10-CM | POA: Diagnosis not present

## 2018-09-01 DIAGNOSIS — D500 Iron deficiency anemia secondary to blood loss (chronic): Secondary | ICD-10-CM | POA: Diagnosis not present

## 2018-09-01 MED ORDER — SODIUM CHLORIDE 0.9 % IV SOLN
750.0000 mg | Freq: Once | INTRAVENOUS | Status: AC
  Administered 2018-09-01: 750 mg via INTRAVENOUS
  Filled 2018-09-01: qty 15

## 2018-09-01 MED ORDER — SODIUM CHLORIDE 0.9 % IV SOLN
Freq: Once | INTRAVENOUS | Status: AC
  Administered 2018-09-01: 10:00:00 via INTRAVENOUS
  Filled 2018-09-01: qty 250

## 2018-09-01 MED ORDER — LORATADINE 10 MG PO TABS
ORAL_TABLET | ORAL | Status: AC
  Filled 2018-09-01: qty 1

## 2018-09-01 MED ORDER — ACETAMINOPHEN 325 MG PO TABS
650.0000 mg | ORAL_TABLET | Freq: Once | ORAL | Status: AC
  Administered 2018-09-01: 650 mg via ORAL

## 2018-09-01 MED ORDER — LORATADINE 10 MG PO TABS
10.0000 mg | ORAL_TABLET | Freq: Once | ORAL | Status: AC
  Administered 2018-09-01: 10 mg via ORAL

## 2018-09-01 MED ORDER — ACETAMINOPHEN 325 MG PO TABS
ORAL_TABLET | ORAL | Status: AC
  Filled 2018-09-01: qty 2

## 2018-09-01 NOTE — Telephone Encounter (Signed)
Where is she in her pack of pills?  If this is her cycle, she should just keep taking her pills and the bleeding should stop when she starts her active pills.

## 2018-09-01 NOTE — Telephone Encounter (Signed)
Patient is calling regarding bleeding on OCP. Patient was told to expect some bleeding (about 2 days) at the end of her cycle. Patient stated that she is now on her 4th day of bleeding. Patient stated that she changed an overnight pad 4 times yesterday. Patient stated that she has had 2 iron transfusions in 8 days. Patient stated that she has taken her pills every night at the same time without missing any. Patient stated that "the blood is the consistency of maple syrup."

## 2018-09-01 NOTE — Patient Instructions (Signed)
Ferric carboxymaltose injection What is this medicine? FERRIC CARBOXYMALTOSE (ferr-ik car-box-ee-mol-toes) is an iron complex. Iron is used to make healthy red blood cells, which carry oxygen and nutrients throughout the body. This medicine is used to treat anemia in people with chronic kidney disease or people who cannot take iron by mouth. This medicine may be used for other purposes; ask your health care provider or pharmacist if you have questions. COMMON BRAND NAME(S): Injectafer What should I tell my health care provider before I take this medicine? They need to know if you have any of these conditions:  high levels of iron in the blood  liver disease  an unusual or allergic reaction to iron, other medicines, foods, dyes, or preservatives  pregnant or trying to get pregnant  breast-feeding How should I use this medicine? This medicine is for infusion into a vein. It is given by a health care professional in a hospital or clinic setting. Talk to your pediatrician regarding the use of this medicine in children. Special care may be needed. Overdosage: If you think you have taken too much of this medicine contact a poison control center or emergency room at once. NOTE: This medicine is only for you. Do not share this medicine with others. What if I miss a dose? It is important not to miss your dose. Call your doctor or health care professional if you are unable to keep an appointment. What may interact with this medicine? Do not take this medicine with any of the following medications:  deferoxamine  dimercaprol  other iron products This list may not describe all possible interactions. Give your health care provider a list of all the medicines, herbs, non-prescription drugs, or dietary supplements you use. Also tell them if you smoke, drink alcohol, or use illegal drugs. Some items may interact with your medicine. What should I watch for while using this medicine? Visit your  doctor or health care professional regularly. Tell your doctor if your symptoms do not start to get better or if they get worse. You may need blood work done while you are taking this medicine. You may need to follow a special diet. Talk to your doctor. Foods that contain iron include: whole grains/cereals, dried fruits, beans, or peas, leafy green vegetables, and organ meats (liver, kidney). What side effects may I notice from receiving this medicine? Side effects that you should report to your doctor or health care professional as soon as possible:  allergic reactions like skin rash, itching or hives, swelling of the face, lips, or tongue  dizziness  facial flushing Side effects that usually do not require medical attention (report to your doctor or health care professional if they continue or are bothersome):  changes in taste  constipation  headache  nausea, vomiting  pain, redness, or irritation at site where injected This list may not describe all possible side effects. Call your doctor for medical advice about side effects. You may report side effects to FDA at 1-800-FDA-1088. Where should I keep my medicine? This drug is given in a hospital or clinic and will not be stored at home. NOTE: This sheet is a summary. It may not cover all possible information. If you have questions about this medicine, talk to your doctor, pharmacist, or health care provider.  2020 Elsevier/Gold Standard (2016-03-29 09:40:29)  Coronavirus (COVID-19) Are you at risk?  Are you at risk for the Coronavirus (COVID-19)?  To be considered HIGH RISK for Coronavirus (COVID-19), you have to meet the following   criteria:  . Traveled to [place], [place], [place], [place] or [place]; or in the [place] to [place], [place], [place], or [place]; and have fever, cough, and shortness of breath within the last 2 weeks of travel OR . Been in close contact with a person diagnosed with COVID-19 within the  last 2 weeks and have fever, cough, and shortness of breath . IF YOU DO NOT MEET THESE CRITERIA, YOU ARE CONSIDERED LOW RISK FOR COVID-19.  What to do if you are HIGH RISK for COVID-19?  . If you are having a medical emergency, call 911. . Seek medical care right away. Before you go to a doctor's office, urgent care or emergency department, call ahead and tell them about your recent travel, contact with someone diagnosed with COVID-19, and your symptoms. You should receive instructions from your physician's office regarding next steps of care.  . When you arrive at healthcare provider, tell the healthcare staff immediately you have returned from visiting [place], [place], [place], [place] or [place]; or traveled in the [place] to [place], [place], [place], or [place]; in the last two weeks or you have been in close contact with a person diagnosed with COVID-19 in the last 2 weeks.   . Tell the health care staff about your symptoms: fever, cough and shortness of breath. . After you have been seen by a medical provider, you will be either: o Tested for (COVID-19) and discharged home on quarantine except to seek medical care if symptoms worsen, and asked to  - Stay home and avoid contact with others until you get your results (4-5 days)  - Avoid travel on public transportation if possible (such as bus, train, or airplane) or o Sent to the Emergency Department by EMS for evaluation, COVID-19 testing, and possible admission depending on your condition and test results.  What to do if you are LOW RISK for COVID-19?  Reduce your risk of any infection by using the same precautions used for avoiding the common cold or flu:  . Wash your hands often with soap and warm water for at least 20 seconds.  If soap and water are not readily available, use an alcohol-based hand sanitizer with at least 60% alcohol.  . If coughing or sneezing, cover your mouth and nose by coughing or sneezing into the elbow  areas of your shirt or coat, into a tissue or into your sleeve (not your hands). . Avoid shaking hands with others and consider head nods or verbal greetings only. . Avoid touching your eyes, nose, or mouth with unwashed hands.  . Avoid close contact with people who are sick. . Avoid places or events with large numbers of people in one location, like concerts or sporting events. . Carefully consider travel plans you have or are making. . If you are planning any travel outside or inside the [place], visit the CDC's Travelers' Health webpage for the latest health notices. . If you have some symptoms but not all symptoms, continue to monitor at home and seek medical attention if your symptoms worsen. . If you are having a medical emergency, call 911.   ADDITIONAL HEALTHCARE OPTIONS FOR PATIENTS  [facility] / e-Visit: [email or web address]         [facility]: [phone]  [facility]: [phone]                   [facility]: [phone]   

## 2018-09-01 NOTE — Telephone Encounter (Signed)
Spoke with patient and she just began new pack of pills last PM. Advised bleeding should stop now that she has started her active pills. Told her to call if heavy bleeding continues. Patient voices understanding.  Routed to provider to sign and close encounter.

## 2018-09-01 NOTE — Telephone Encounter (Signed)
Spoke with patient.She is currently taking Loloestrin daily--hasn't missed any pills or taken any late.She began first cycle after D & C on 08/28/18 spotting until 7/5. Yesterday she changed 3 overnight pads during the day. Did nothing last PM or this morning until she sat down for her iron infusion and began bleeding heavily again w/clots and passing what she describes as "massive globs of maple syrup". Had iron infusion today and 10 days ago.   She is having colon/EGD with GI in [facility] 7/28 because they think she may have an AVM in small bowel.   Patient calling requesting any further recommendations for bleeding. She was hoping the Loloestrin would help.routed to provider

## 2018-09-02 DIAGNOSIS — F330 Major depressive disorder, recurrent, mild: Secondary | ICD-10-CM | POA: Diagnosis not present

## 2018-09-09 DIAGNOSIS — F330 Major depressive disorder, recurrent, mild: Secondary | ICD-10-CM | POA: Diagnosis not present

## 2018-09-11 ENCOUNTER — Ambulatory Visit
Admission: RE | Admit: 2018-09-11 | Discharge: 2018-09-11 | Disposition: A | Discharge status: Home / Self Care | Payer: BC Managed Care – PPO | Source: Ambulatory Visit | Attending: Internal Medicine | Admitting: Internal Medicine

## 2018-09-11 ENCOUNTER — Other Ambulatory Visit: Payer: Self-pay

## 2018-09-11 DIAGNOSIS — Z1231 Encounter for screening mammogram for malignant neoplasm of breast: Secondary | ICD-10-CM

## 2018-09-16 DIAGNOSIS — F330 Major depressive disorder, recurrent, mild: Secondary | ICD-10-CM | POA: Diagnosis not present

## 2018-09-18 DIAGNOSIS — R102 Pelvic and perineal pain: Secondary | ICD-10-CM | POA: Diagnosis not present

## 2018-09-18 DIAGNOSIS — M6281 Muscle weakness (generalized): Secondary | ICD-10-CM | POA: Diagnosis not present

## 2018-09-18 DIAGNOSIS — R3911 Hesitancy of micturition: Secondary | ICD-10-CM | POA: Diagnosis not present

## 2018-09-18 DIAGNOSIS — K5900 Constipation, unspecified: Secondary | ICD-10-CM | POA: Diagnosis not present

## 2018-09-20 DIAGNOSIS — Z1159 Encounter for screening for other viral diseases: Secondary | ICD-10-CM | POA: Diagnosis not present

## 2018-09-22 ENCOUNTER — Other Ambulatory Visit: Payer: Self-pay

## 2018-09-22 ENCOUNTER — Inpatient Hospital Stay: Payer: BC Managed Care – PPO

## 2018-09-22 VITALS — BP 107/68 | HR 90 | Temp 99.0°F | Resp 16

## 2018-09-22 DIAGNOSIS — D500 Iron deficiency anemia secondary to blood loss (chronic): Secondary | ICD-10-CM | POA: Diagnosis not present

## 2018-09-22 DIAGNOSIS — K909 Intestinal malabsorption, unspecified: Secondary | ICD-10-CM

## 2018-09-22 MED ORDER — CYANOCOBALAMIN 1000 MCG/ML IJ SOLN
1000.0000 ug | INTRAMUSCULAR | Status: DC
  Administered 2018-09-22: 1000 ug via SUBCUTANEOUS

## 2018-09-22 MED ORDER — CYANOCOBALAMIN 1000 MCG/ML IJ SOLN
INTRAMUSCULAR | Status: AC
  Filled 2018-09-22: qty 1

## 2018-09-22 NOTE — Patient Instructions (Signed)
Cyanocobalamin, Pyridoxine, and Folate What is this medicine? A multivitamin containing folic acid, vitamin B6, and vitamin B12. This medicine may be used for other purposes; ask your health care provider or pharmacist if you have questions. COMMON BRAND NAME(S): AllanFol RX, AllanTex, Av-Vite FB, B Complex with Folic Acid, ComBgen, FaBB, Folamin, Folastin, Folbalin, Folbee, Folbic, Folcaps, Folgard, Folgard RX, Folgard RX 2.2, Folplex, Folplex 2.2, Foltabs 800, Foltx, Homocysteine Formula, Niva-Fol, NuFol, TL Gard RX, Virt-Gard, Virt-Vite, Virt-Vite Forte, Vita-Respa What should I tell my health care provider before I take this medicine? They need to know if you have any of these conditions:  bleeding or clotting disorder  history of anemia of any type  other chronic health condition  an unusual or allergic reaction to vitamins, other medicines, foods, dyes, or preservatives  pregnant or trying to get pregnant  breast-feeding How should I use this medicine? Take by mouth with a glass of water. May take with food. Follow the directions on the prescription label. It is usually given once a day. Do not take your medicine more often than directed. Contact your pediatrician regarding the use of this medicine in children. Special care may be needed. Overdosage: If you think you have taken too much of this medicine contact a poison control center or emergency room at once. NOTE: This medicine is only for you. Do not share this medicine with others. What if I miss a dose? If you miss a dose, take it as soon as you can. If it is almost time for your next dose, take only that dose. Do not take double or extra doses. What may interact with this medicine?  levodopa This list may not describe all possible interactions. Give your health care provider a list of all the medicines, herbs, non-prescription drugs, or dietary supplements you use. Also tell them if you smoke, drink alcohol, or use illegal  drugs. Some items may interact with your medicine. What should I watch for while using this medicine? See your health care professional for regular checks on your progress. Remember that vitamin supplements do not replace the need for good nutrition from a balanced diet. What side effects may I notice from receiving this medicine? Side effects that you should report to your doctor or health care professional as soon as possible:  allergic reaction such as skin rash or difficulty breathing  vomiting Side effects that usually do not require medical attention (report to your doctor or health care professional if they continue or are bothersome):  nausea  stomach upset This list may not describe all possible side effects. Call your doctor for medical advice about side effects. You may report side effects to FDA at 1-800-FDA-1088. Where should I keep my medicine? Keep out of the reach of children. Most vitamins should be stored at controlled room temperature. Check your specific product directions. Protect from heat and moisture. Throw away any unused medicine after the expiration date. NOTE: This sheet is a summary. It may not cover all possible information. If you have questions about this medicine, talk to your doctor, pharmacist, or health care provider.  2020 Elsevier/Gold Standard (2007-04-05 00:59:55)  

## 2018-09-23 DIAGNOSIS — F419 Anxiety disorder, unspecified: Secondary | ICD-10-CM | POA: Diagnosis not present

## 2018-09-23 DIAGNOSIS — E039 Hypothyroidism, unspecified: Secondary | ICD-10-CM | POA: Diagnosis not present

## 2018-09-23 DIAGNOSIS — E785 Hyperlipidemia, unspecified: Secondary | ICD-10-CM | POA: Diagnosis not present

## 2018-09-23 DIAGNOSIS — D509 Iron deficiency anemia, unspecified: Secondary | ICD-10-CM | POA: Diagnosis not present

## 2018-09-23 DIAGNOSIS — K6389 Other specified diseases of intestine: Secondary | ICD-10-CM | POA: Diagnosis not present

## 2018-09-23 DIAGNOSIS — R51 Headache: Secondary | ICD-10-CM | POA: Diagnosis not present

## 2018-09-23 DIAGNOSIS — E119 Type 2 diabetes mellitus without complications: Secondary | ICD-10-CM | POA: Diagnosis not present

## 2018-09-23 DIAGNOSIS — Z79899 Other long term (current) drug therapy: Secondary | ICD-10-CM | POA: Diagnosis not present

## 2018-09-23 DIAGNOSIS — Z9049 Acquired absence of other specified parts of digestive tract: Secondary | ICD-10-CM | POA: Diagnosis not present

## 2018-09-23 DIAGNOSIS — Z980 Intestinal bypass and anastomosis status: Secondary | ICD-10-CM | POA: Diagnosis not present

## 2018-09-23 DIAGNOSIS — F329 Major depressive disorder, single episode, unspecified: Secondary | ICD-10-CM | POA: Diagnosis not present

## 2018-09-23 DIAGNOSIS — Z7984 Long term (current) use of oral hypoglycemic drugs: Secondary | ICD-10-CM | POA: Diagnosis not present

## 2018-09-23 DIAGNOSIS — K219 Gastro-esophageal reflux disease without esophagitis: Secondary | ICD-10-CM | POA: Diagnosis not present

## 2018-09-27 DIAGNOSIS — F332 Major depressive disorder, recurrent severe without psychotic features: Secondary | ICD-10-CM | POA: Diagnosis not present

## 2018-09-30 DIAGNOSIS — F330 Major depressive disorder, recurrent, mild: Secondary | ICD-10-CM | POA: Diagnosis not present

## 2018-10-01 ENCOUNTER — Telehealth: Payer: Self-pay | Admitting: *Deleted

## 2018-10-01 NOTE — Telephone Encounter (Signed)
Patient would like to speak with nurse regarding her birth control Loloestrin and having suicidal thoughts.

## 2018-10-01 NOTE — Telephone Encounter (Signed)
Reviewed call with Dr. Quincy Simmonds and advised to contact [facility].   Call to [facility], spoke with Regan in Outpatient Services. States if patient does not have intent or active plan, patient should follow up with Psychiatrist asap. If patient does have a plan will need to seek care at [facility] immediately.   Call to patient. Patient states she has longstanding anxiety and depression. States she has had the same therapist, Dr. Arville Care, for 5 years and that she sees her weekly. Just had a virtual visit with her yesterday. States she has had 2 episodes of suicidal thoughts, 30 days apart. Last episode was on 7-26. Patient states that she wants to clarify that her thoughts are just those of hopelessness, helplessness and no way out of where she is right now. States she does not have an active plan. States she has never attempted suicide and never will. Just has feelings of hopelessness. States she thinks this change could possibly be related to the LoLoestrin as that is the only medication change she has had in years. States the pills don't seem to be helping and are "not good for my mental health." Patient states she also saw her Psychiatrist, Dr. Toy Care virtually on Saturday and mentioned the "2 episodes" to her. Dr. Toy Care recommended that it could be that the hormones are "fighting" the medications she is prescribing and "the hormones are winning." Patient states she just wanted to call our office to follow up with Dr. Sabra Heck. States she will do whatever Dr. Sabra Heck recommends, but wants her mental health to be better. RN advised would need to review with covering provider and return call with recommendations. Patient states next follow up with Dr. Liane Comber will be Tuesday at 1500 and that she can always "check in" with Dr. Toy Care. Patient states "I am safe. I am okay." Does not have active plan. Suicidal Ideation precautions reviewed with patient when to seek care at [facility]. Patient verbalized understanding and states "I am perfectly  fine today." States she has good support in a close friend. Aware RN will return call with recommendations. Patient agreeable.   Reesa Chew, RN present for 15 minute phone call.   Routing to provider for review.   Cc Dr. Sabra Heck

## 2018-10-01 NOTE — Telephone Encounter (Signed)
I recommend a follow up appointment this week with Dr. Sabra Heck, who has been treating her abnormal uterine bleeding.   Cc- Dr. Sabra Heck

## 2018-10-01 NOTE — Telephone Encounter (Signed)
Call to patient. Message given to patient as seen below from Dr. Quincy Simmonds. Patient scheduled for OV with Dr. Sabra Heck on 10-02-2018 at 1530. Patient agreeable to date and time of appointment.   Routing to provider and will close encounter.   Cc Dr. Sabra Heck

## 2018-10-02 ENCOUNTER — Encounter: Payer: Self-pay | Admitting: Obstetrics & Gynecology

## 2018-10-02 ENCOUNTER — Other Ambulatory Visit: Payer: Self-pay

## 2018-10-02 ENCOUNTER — Ambulatory Visit: Payer: BC Managed Care – PPO | Admitting: Obstetrics & Gynecology

## 2018-10-02 VITALS — BP 102/60 | HR 92 | Temp 97.2°F | Ht 64.0 in | Wt 128.8 lb

## 2018-10-02 DIAGNOSIS — T887XXA Unspecified adverse effect of drug or medicament, initial encounter: Secondary | ICD-10-CM

## 2018-10-02 DIAGNOSIS — Z3009 Encounter for other general counseling and advice on contraception: Secondary | ICD-10-CM | POA: Diagnosis not present

## 2018-10-02 DIAGNOSIS — N921 Excessive and frequent menstruation with irregular cycle: Secondary | ICD-10-CM

## 2018-10-02 DIAGNOSIS — E611 Iron deficiency: Secondary | ICD-10-CM | POA: Diagnosis not present
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
## 2018-10-03 LAB — IRON,TIBC AND FERRITIN PANEL
Ferritin: 512 ng/mL — ABNORMAL HIGH (ref 15–150)
Iron Saturation: 39 % (ref 15–55)
Iron: 127 ug/dL (ref 27–159)
Total Iron Binding Capacity: 328 ug/dL (ref 250–450)
UIBC: 201 ug/dL (ref 131–425)

## 2018-10-07 ENCOUNTER — Telehealth: Payer: Self-pay | Admitting: Obstetrics & Gynecology

## 2018-10-07 DIAGNOSIS — Z30431 Encounter for routine checking of intrauterine contraceptive device: Secondary | ICD-10-CM

## 2018-10-07 DIAGNOSIS — N921 Excessive and frequent menstruation with irregular cycle: Secondary | ICD-10-CM

## 2018-10-07 DIAGNOSIS — F330 Major depressive disorder, recurrent, mild: Secondary | ICD-10-CM | POA: Diagnosis not present

## 2018-10-07 NOTE — Telephone Encounter (Signed)
Call placed to convey benefits for Mirena IUD.

## 2018-10-08 NOTE — Telephone Encounter (Signed)
Spoke with patient. Brief explanation of IUD insertion provided. Advised patient to take Motrin 800 mg with food and water one hour before procedure. Patient expresses concern about pain with IUD insertion in comparison to experience with D&C, asking if she will need a driver? Advised driver not required, but if she has someone that can bring her that would be ok. G0P0, advised I will review Cytotec with covering provider and return call with recommendations. Questions answered, patient states she feels better about proceeding.   Dr. Quincy Simmonds -please advise on Cytotec.

## 2018-10-08 NOTE — Telephone Encounter (Signed)
I recommend she use Cytotec 200 mcg per vagina at hs the night prior to the IUD insertion and then 200 mcg per vagina in the am the day of the insertion.  This may cause some pelvic cramping, which is temporary and should be tolerable.

## 2018-10-08 NOTE — Telephone Encounter (Signed)
Spoke with patient regarding benefit for ultrasound guided IUD insertion. Patient acknowledges understanding of information presented. Patient is scheduled 10/23/2018 with Dr Sabra Heck. Patient is aware of the appointment date, arrival time and cancellation policy.   Patient would like to speak with a nurse to obtained more information about this procedure, the expectations of the procedure and has questions about medication to take prior to the appointment.  Forwarding to Triage Nurse

## 2018-10-09 MED ORDER — MISOPROSTOL 200 MCG PO TABS: ORAL_TABLET | ORAL | 0 refills | Status: DC

## 2018-10-09 NOTE — Telephone Encounter (Signed)
Spoke with patient, advised as seen below per Dr. Quincy Simmonds. Rx for cytotec to verified pharmacy. Patient read back instructions, verbalizes understanding.   Encounter closed.

## 2018-10-14 DIAGNOSIS — F330 Major depressive disorder, recurrent, mild: Secondary | ICD-10-CM | POA: Diagnosis not present

## 2018-10-16 DIAGNOSIS — R102 Pelvic and perineal pain: Secondary | ICD-10-CM | POA: Diagnosis not present

## 2018-10-16 DIAGNOSIS — M6289 Other specified disorders of muscle: Secondary | ICD-10-CM | POA: Diagnosis not present

## 2018-10-16 DIAGNOSIS — L910 Hypertrophic scar: Secondary | ICD-10-CM | POA: Diagnosis not present

## 2018-10-16 DIAGNOSIS — R350 Frequency of micturition: Secondary | ICD-10-CM | POA: Diagnosis not present

## 2018-10-21 ENCOUNTER — Telehealth: Payer: Self-pay | Admitting: Obstetrics & Gynecology

## 2018-10-21 DIAGNOSIS — F330 Major depressive disorder, recurrent, mild: Secondary | ICD-10-CM | POA: Diagnosis not present

## 2018-10-21 NOTE — Telephone Encounter (Signed)
Patient has blood vessel in outer labia that is currently "bulbous". States it is uncomfortable to even wear jeans. In the past, it has been very flattened. Wants to be sure Dr. Sabra Heck can look at it during appointment on 10/23/18.

## 2018-10-21 NOTE — Telephone Encounter (Signed)
Spoke with patient. Has a flat blood vessel on outer labia that has been present for a couple of years, has noticed if bulging more recently, shape has changed. Reports discomfort when wearing jeans. Asking if Dr. Sabra Heck can take a look and make recommendations? Patient is scheduled for US guided IUD insertion on 8/27, advised Dr. Sabra Heck can evaluate at that time, can further discuss. Patient thankful for return call.   Routing to provider for final review. Patient is agreeable to disposition. Will close encounter.

## 2018-10-22 DIAGNOSIS — M6281 Muscle weakness (generalized): Secondary | ICD-10-CM | POA: Diagnosis not present

## 2018-10-22 DIAGNOSIS — R3911 Hesitancy of micturition: Secondary | ICD-10-CM | POA: Diagnosis not present

## 2018-10-22 DIAGNOSIS — R197 Diarrhea, unspecified: Secondary | ICD-10-CM | POA: Diagnosis not present

## 2018-10-22 DIAGNOSIS — R102 Pelvic and perineal pain: Secondary | ICD-10-CM | POA: Diagnosis not present

## 2018-10-23 ENCOUNTER — Inpatient Hospital Stay: Payer: BC Managed Care – PPO

## 2018-10-23 ENCOUNTER — Ambulatory Visit (INDEPENDENT_AMBULATORY_CARE_PROVIDER_SITE_OTHER): Payer: BC Managed Care – PPO

## 2018-10-23 ENCOUNTER — Other Ambulatory Visit: Payer: Self-pay

## 2018-10-23 ENCOUNTER — Ambulatory Visit (INDEPENDENT_AMBULATORY_CARE_PROVIDER_SITE_OTHER): Payer: BC Managed Care – PPO | Admitting: Obstetrics & Gynecology

## 2018-10-23 VITALS — BP 108/62 | HR 78 | Temp 98.0°F | Resp 16

## 2018-10-23 VITALS — BP 110/78 | HR 80 | Temp 97.5°F | Ht 64.0 in | Wt 128.0 lb

## 2018-10-23 DIAGNOSIS — Z30431 Encounter for routine checking of intrauterine contraceptive device: Secondary | ICD-10-CM | POA: Diagnosis not present

## 2018-10-23 DIAGNOSIS — E538 Deficiency of other specified B group vitamins: Secondary | ICD-10-CM | POA: Insufficient documentation

## 2018-10-23 DIAGNOSIS — N9089 Other specified noninflammatory disorders of vulva and perineum: Secondary | ICD-10-CM

## 2018-10-23 DIAGNOSIS — Z3009 Encounter for other general counseling and advice on contraception: Secondary | ICD-10-CM

## 2018-10-23 DIAGNOSIS — N9489 Other specified conditions associated with female genital organs and menstrual cycle: Secondary | ICD-10-CM | POA: Diagnosis not present

## 2018-10-23 DIAGNOSIS — Z30430 Encounter for insertion of intrauterine contraceptive device: Secondary | ICD-10-CM

## 2018-10-23 DIAGNOSIS — N921 Excessive and frequent menstruation with irregular cycle: Secondary | ICD-10-CM

## 2018-10-23 MED ORDER — CYANOCOBALAMIN 1000 MCG/ML IJ SOLN
INTRAMUSCULAR | Status: AC
  Filled 2018-10-23: qty 1

## 2018-10-23 MED ORDER — CYANOCOBALAMIN 1000 MCG/ML IJ SOLN: 1000.0000 ug | INTRAMUSCULAR | Status: DC

## 2018-10-23 NOTE — Patient Instructions (Signed)
Cyanocobalamin, Pyridoxine, and Folate What is this medicine? A multivitamin containing folic acid, vitamin B6, and vitamin B12. This medicine may be used for other purposes; ask your health care provider or pharmacist if you have questions. COMMON BRAND NAME(S): AllanFol RX, AllanTex, Av-Vite FB, B Complex with Folic Acid, ComBgen, FaBB, Folamin, Folastin, Folbalin, Folbee, Folbic, Folcaps, Folgard, Folgard RX, Folgard RX 2.2, Folplex, Folplex 2.2, Foltabs 800, Foltx, Homocysteine Formula, Niva-Fol, NuFol, TL Gard RX, Virt-Gard, Virt-Vite, Virt-Vite Forte, Vita-Respa What should I tell my health care provider before I take this medicine? They need to know if you have any of these conditions:  bleeding or clotting disorder  history of anemia of any type  other chronic health condition  an unusual or allergic reaction to vitamins, other medicines, foods, dyes, or preservatives  pregnant or trying to get pregnant  breast-feeding How should I use this medicine? Take by mouth with a glass of water. May take with food. Follow the directions on the prescription label. It is usually given once a day. Do not take your medicine more often than directed. Contact your pediatrician regarding the use of this medicine in children. Special care may be needed. Overdosage: If you think you have taken too much of this medicine contact a poison control center or emergency room at once. NOTE: This medicine is only for you. Do not share this medicine with others. What if I miss a dose? If you miss a dose, take it as soon as you can. If it is almost time for your next dose, take only that dose. Do not take double or extra doses. What may interact with this medicine?  levodopa This list may not describe all possible interactions. Give your health care provider a list of all the medicines, herbs, non-prescription drugs, or dietary supplements you use. Also tell them if you smoke, drink alcohol, or use illegal  drugs. Some items may interact with your medicine. What should I watch for while using this medicine? See your health care professional for regular checks on your progress. Remember that vitamin supplements do not replace the need for good nutrition from a balanced diet. What side effects may I notice from receiving this medicine? Side effects that you should report to your doctor or health care professional as soon as possible:  allergic reaction such as skin rash or difficulty breathing  vomiting Side effects that usually do not require medical attention (report to your doctor or health care professional if they continue or are bothersome):  nausea  stomach upset This list may not describe all possible side effects. Call your doctor for medical advice about side effects. You may report side effects to FDA at 1-800-FDA-1088. Where should I keep my medicine? Keep out of the reach of children. Most vitamins should be stored at controlled room temperature. Check your specific product directions. Protect from heat and moisture. Throw away any unused medicine after the expiration date. NOTE: This sheet is a summary. It may not cover all possible information. If you have questions about this medicine, talk to your doctor, pharmacist, or health care provider.  2020 Elsevier/Gold Standard (2007-04-05 00:59:55)  
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
## 2018-10-26 ENCOUNTER — Encounter: Payer: Self-pay | Admitting: Obstetrics & Gynecology

## 2018-10-28 ENCOUNTER — Telehealth: Payer: Self-pay | Admitting: *Deleted

## 2018-10-28 DIAGNOSIS — F330 Major depressive disorder, recurrent, mild: Secondary | ICD-10-CM | POA: Diagnosis not present

## 2018-10-28 NOTE — Telephone Encounter (Signed)
Patient is returning call to Reina. °

## 2018-10-28 NOTE — Telephone Encounter (Signed)
-----   Message from Megan Salon, MD sent at 10/28/2018  8:10 AM EDT ----- Please let pt know the area that I removed was a clot.  So, the hardness was the clot in the varicose vein.  This is not the same as a DVT as this is superficial and not a deep vein.  No treatment needed for this.  It would have slowly resolved if left alone but was fine to remove and should heal and return to baseline in about two weeks.

## 2018-10-28 NOTE — Telephone Encounter (Signed)
LM for pt to call back.

## 2018-10-28 NOTE — Telephone Encounter (Signed)
LM for pt to call back. Route to Triage if I am not available.

## 2018-10-28 NOTE — Telephone Encounter (Signed)
Pt notified.  Verbalized understanding.

## 2018-10-31 ENCOUNTER — Telehealth: Payer: Self-pay | Admitting: Obstetrics & Gynecology

## 2018-10-31 ENCOUNTER — Encounter: Payer: Self-pay | Admitting: Obstetrics & Gynecology

## 2018-10-31 NOTE — Telephone Encounter (Signed)
Spoke with patient. Patient unable to view 10/23/18 pathology report on MYChart, request copy be mailed. Patient notified of results on 10/28/18. Mailing address on file confirmed, copy placed in [place] Mail. Questions answered.   Encounter closed.

## 2018-10-31 NOTE — Telephone Encounter (Signed)
Left message to call Raja Caputi, RN at [facility] [phone].   

## 2018-10-31 NOTE — Telephone Encounter (Signed)
Patient is returning a call to Jill. °

## 2018-10-31 NOTE — Telephone Encounter (Signed)
Patient sent the following message through [facility]. Routing to triage to assist patient with request.  [name], [name] "Denise Kerr"  P [facility] Clinical Pool  Phone Number: [phone]        Hi Dr. Sabra Heck,   Neither of the pathology reports released to me have any information in the 'Results' area. One says 'See Progress Note' and the other just says 'Final Result'.   Can you provide the information so that I may see it on this side of the portal?   Thanks!   Denise Kerr

## 2018-11-04 DIAGNOSIS — F330 Major depressive disorder, recurrent, mild: Secondary | ICD-10-CM | POA: Diagnosis not present

## 2018-11-11 DIAGNOSIS — F330 Major depressive disorder, recurrent, mild: Secondary | ICD-10-CM | POA: Diagnosis not present

## 2018-11-13 ENCOUNTER — Ambulatory Visit: Payer: BC Managed Care – PPO | Admitting: Obstetrics & Gynecology

## 2018-11-13 ENCOUNTER — Other Ambulatory Visit: Payer: Self-pay

## 2018-11-13 VITALS — BP 130/80 | HR 104 | Temp 97.3°F | Ht 64.0 in | Wt 127.0 lb

## 2018-11-13 DIAGNOSIS — E611 Iron deficiency: Secondary | ICD-10-CM

## 2018-11-13 DIAGNOSIS — N9089 Other specified noninflammatory disorders of vulva and perineum: Secondary | ICD-10-CM
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
## 2018-11-14 LAB — IRON,TIBC AND FERRITIN PANEL
Ferritin: 584 ng/mL — ABNORMAL HIGH (ref 15–150)
Iron Saturation: 59 % — ABNORMAL HIGH (ref 15–55)
Iron: 147 ug/dL (ref 27–159)
Total Iron Binding Capacity: 248 ug/dL — ABNORMAL LOW (ref 250–450)
UIBC: 101 ug/dL — ABNORMAL LOW (ref 131–425)

## 2018-11-16 ENCOUNTER — Encounter: Payer: Self-pay | Admitting: Obstetrics & Gynecology

## 2018-11-18 DIAGNOSIS — L719 Rosacea, unspecified: Secondary | ICD-10-CM | POA: Diagnosis not present

## 2018-11-18 DIAGNOSIS — Z23 Encounter for immunization: Secondary | ICD-10-CM | POA: Diagnosis not present

## 2018-11-18 DIAGNOSIS — L309 Dermatitis, unspecified: Secondary | ICD-10-CM | POA: Diagnosis not present

## 2018-11-18 DIAGNOSIS — F330 Major depressive disorder, recurrent, mild: Secondary | ICD-10-CM | POA: Diagnosis not present

## 2018-11-20 ENCOUNTER — Telehealth: Payer: Self-pay | Admitting: Obstetrics & Gynecology

## 2018-11-20 ENCOUNTER — Encounter: Payer: Self-pay | Admitting: Obstetrics & Gynecology

## 2018-11-20 NOTE — Telephone Encounter (Signed)
Called patient to reschedule appt.  Send myChart message to patient with new appt.

## 2018-11-20 NOTE — Telephone Encounter (Signed)
This is an IUD recheck.  Please call and reschedule this appt.

## 2018-11-20 NOTE — Telephone Encounter (Signed)
Patient sent the following message through [facility].   Denise Kerr, Denise Dover E "[name]"  P [facility] Clinical Pool  Phone Number: [phone]        Hi Dr. Sabra Heck,  I noticed this appt. in [place] and wondered if it needed to be rescheduled. I think it was scheduled to check IUD placement.  Just let me know.  Thank you,  [place]  Appointment Information:   Visit Type: Office Visit     Date: 12/08/2018         Dept: [facility] [place]         Provider: Megan Salon         Time: 4:00 PM         Length: 30 min    Appt Status: Canceled     Cancel Reason: Provider

## 2018-11-24 ENCOUNTER — Inpatient Hospital Stay: Payer: BC Managed Care – PPO

## 2018-11-24 ENCOUNTER — Inpatient Hospital Stay: Payer: BC Managed Care – PPO | Admitting: Hematology

## 2018-11-24 ENCOUNTER — Telehealth: Payer: Self-pay | Admitting: Hematology

## 2018-11-24 NOTE — Telephone Encounter (Signed)
R/s appt per 9/28 sch message - pt aware of new appt date and time

## 2018-11-25 DIAGNOSIS — F330 Major depressive disorder, recurrent, mild: Secondary | ICD-10-CM | POA: Diagnosis not present

## 2018-11-28 ENCOUNTER — Telehealth: Payer: Self-pay | Admitting: Hematology

## 2018-11-28 ENCOUNTER — Inpatient Hospital Stay: Payer: BC Managed Care – PPO | Admitting: Hematology

## 2018-11-28 ENCOUNTER — Inpatient Hospital Stay: Payer: BC Managed Care – PPO | Attending: Hematology

## 2018-11-28 ENCOUNTER — Other Ambulatory Visit: Payer: Self-pay

## 2018-11-28 ENCOUNTER — Inpatient Hospital Stay: Payer: BC Managed Care – PPO

## 2018-11-28 VITALS — BP 98/73 | HR 96 | Temp 98.3°F | Resp 18 | Ht 64.0 in | Wt 124.1 lb

## 2018-11-28 DIAGNOSIS — Z800 Family history of malignant neoplasm of digestive organs: Secondary | ICD-10-CM | POA: Diagnosis not present

## 2018-11-28 DIAGNOSIS — E119 Type 2 diabetes mellitus without complications: Secondary | ICD-10-CM | POA: Diagnosis not present

## 2018-11-28 DIAGNOSIS — Z7984 Long term (current) use of oral hypoglycemic drugs: Secondary | ICD-10-CM | POA: Diagnosis not present

## 2018-11-28 DIAGNOSIS — K909 Intestinal malabsorption, unspecified: Secondary | ICD-10-CM

## 2018-11-28 DIAGNOSIS — D500 Iron deficiency anemia secondary to blood loss (chronic): Secondary | ICD-10-CM

## 2018-11-28 DIAGNOSIS — E039 Hypothyroidism, unspecified: Secondary | ICD-10-CM | POA: Insufficient documentation

## 2018-11-28 DIAGNOSIS — Z79899 Other long term (current) drug therapy: Secondary | ICD-10-CM | POA: Insufficient documentation

## 2018-11-28 DIAGNOSIS — D508 Other iron deficiency anemias: Secondary | ICD-10-CM | POA: Insufficient documentation

## 2018-11-28 DIAGNOSIS — E538 Deficiency of other specified B group vitamins: Secondary | ICD-10-CM | POA: Diagnosis not present

## 2018-11-28 DIAGNOSIS — E611 Iron deficiency: Secondary | ICD-10-CM

## 2018-11-28 DIAGNOSIS — Z87891 Personal history of nicotine dependence: Secondary | ICD-10-CM | POA: Insufficient documentation

## 2018-11-28 DIAGNOSIS — Z8042 Family history of malignant neoplasm of prostate: Secondary | ICD-10-CM | POA: Insufficient documentation

## 2018-11-28 LAB — CBC WITH DIFFERENTIAL/PLATELET
Abs Immature Granulocytes: 0.02 10*3/uL (ref 0.00–0.07)
Basophils Absolute: 0 10*3/uL (ref 0.0–0.1)
Basophils Relative: 1 %
Eosinophils Absolute: 0.2 10*3/uL (ref 0.0–0.5)
Eosinophils Relative: 3 %
HCT: 43 % (ref 36.0–46.0)
Hemoglobin: 14.5 g/dL (ref 12.0–15.0)
Immature Granulocytes: 0 %
Lymphocytes Relative: 27 %
Lymphs Abs: 1.6 10*3/uL (ref 0.7–4.0)
MCH: 31.5 pg (ref 26.0–34.0)
MCHC: 33.7 g/dL (ref 30.0–36.0)
MCV: 93.3 fL (ref 80.0–100.0)
Monocytes Absolute: 0.4 10*3/uL (ref 0.1–1.0)
Monocytes Relative: 6 %
Neutro Abs: 3.7 10*3/uL (ref 1.7–7.7)
Neutrophils Relative %: 63 %
Platelets: 185 10*3/uL (ref 150–400)
RBC: 4.61 MIL/uL (ref 3.87–5.11)
RDW: 12.4 % (ref 11.5–15.5)
WBC: 5.9 10*3/uL (ref 4.0–10.5)
nRBC: 0 % (ref 0.0–0.2)

## 2018-11-28 LAB — CMP (CANCER CENTER ONLY)
ALT: 33 U/L (ref 0–44)
AST: 23 U/L (ref 15–41)
Albumin: 4.5 g/dL (ref 3.5–5.0)
Alkaline Phosphatase: 96 U/L (ref 38–126)
Anion gap: 9 (ref 5–15)
BUN: 11 mg/dL (ref 6–20)
CO2: 29 mmol/L (ref 22–32)
Calcium: 9.4 mg/dL (ref 8.9–10.3)
Chloride: 99 mmol/L (ref 98–111)
Creatinine: 0.81 mg/dL (ref 0.44–1.00)
GFR, Est AFR Am: >60 mL/min (ref >60)
GFR, Estimated: >60 mL/min (ref >60)
Glucose, Bld: 168 mg/dL — ABNORMAL HIGH (ref 70–99)
Potassium: 4.2 mmol/L (ref 3.5–5.1)
Sodium: 137 mmol/L (ref 135–145)
Total Bilirubin: 0.4 mg/dL (ref 0.3–1.2)
Total Protein: 7.1 g/dL (ref 6.5–8.1)

## 2018-11-28 LAB — IRON AND TIBC
Iron: 126 ug/dL (ref 41–142)
Saturation Ratios: 51 % (ref 21–57)
TIBC: 246 ug/dL (ref 236–444)
UIBC: 120 ug/dL (ref 120–384)

## 2018-11-28 LAB — FERRITIN: Ferritin: 492 ng/mL — ABNORMAL HIGH (ref 11–307)

## 2018-11-28 LAB — VITAMIN B12: Vitamin B-12: 266 pg/mL (ref 180–914)

## 2018-11-28 MED ORDER — CYANOCOBALAMIN 1000 MCG/ML IJ SOLN: 1000.0000 ug | INTRAMUSCULAR | 0 refills | Status: DC

## 2018-11-28 MED ORDER — CYANOCOBALAMIN 1000 MCG/ML IJ SOLN
INTRAMUSCULAR | Status: AC
  Filled 2018-11-28: qty 1

## 2018-11-28 MED ORDER — CYANOCOBALAMIN 1000 MCG/ML IJ SOLN: 1000.0000 ug | INTRAMUSCULAR | Status: DC

## 2018-11-28 MED ORDER — CYANOCOBALAMIN 1000 MCG/ML IJ SOLN
1000.0000 ug | Freq: Once | INTRAMUSCULAR | Status: AC
  Administered 2018-11-28: 1000 ug via SUBCUTANEOUS

## 2018-11-28 NOTE — Patient Instructions (Addendum)
Cyanocobalamin, Pyridoxine, and Folate What is this medicine? A multivitamin containing folic acid, vitamin B6, and vitamin B12. This medicine may be used for other purposes; ask your health care provider or pharmacist if you have questions. COMMON BRAND NAME(S): AllanFol RX, AllanTex, Av-Vite FB, B Complex with Folic Acid, ComBgen, FaBB, Folamin, Folastin, [place], [place], [place], [place], [place], [employer], [place] RX 2.2, [place], [place] 2.2, Foltabs 800, Foltx, Homocysteine Formula, Niva-Fol, NuFol, [employer], Virt-Gard, Virt-Vite, Virt-Vite [place], Vita-Respa What should I tell my health care provider before I take this medicine? They need to know if you have any of these conditions:  bleeding or clotting disorder  history of anemia of any type  other chronic health condition  an unusual or allergic reaction to vitamins, other medicines, foods, dyes, or preservatives  pregnant or trying to get pregnant  breast-feeding How should I use this medicine? Take by mouth with a glass of water. May take with food. Follow the directions on the prescription label. It is usually given once a day. Do not take your medicine more often than directed. Contact your pediatrician regarding the use of this medicine in children. Special care may be needed. Overdosage: If you think you have taken too much of this medicine contact a poison control center or emergency room at once. NOTE: This medicine is only for you. Do not share this medicine with others. What if I miss a dose? If you miss a dose, take it as soon as you can. If it is almost time for your next dose, take only that dose. Do not take double or extra doses. What may interact with this medicine?  levodopa This list may not describe all possible interactions. Give your health care provider a list of all the medicines, herbs, non-prescription drugs, or dietary supplements you use. Also tell them if you smoke, drink alcohol, or use illegal  drugs. Some items may interact with your medicine. What should I watch for while using this medicine? See your health care professional for regular checks on your progress. Remember that vitamin supplements do not replace the need for good nutrition from a balanced diet. What side effects may I notice from receiving this medicine? Side effects that you should report to your doctor or health care professional as soon as possible:  allergic reaction such as skin rash or difficulty breathing  vomiting Side effects that usually do not require medical attention (report to your doctor or health care professional if they continue or are bothersome):  nausea  stomach upset This list may not describe all possible side effects. Call your doctor for medical advice about side effects. You may report side effects to FDA at 1-800-FDA-1088. Where should I keep my medicine? Keep out of the reach of children. Most vitamins should be stored at controlled room temperature. Check your specific product directions. Protect from heat and moisture. Throw away any unused medicine after the expiration date. NOTE: This sheet is a summary. It may not cover all possible information. If you have questions about this medicine, talk to your doctor, pharmacist, or health care provider.  2020 Elsevier/Gold Standard (2007-04-05 00:59:55)  Subcutaneous Injection Instructions Using a Prefilled Syringe A subcutaneous injection is a shot of medicine that is given into the layer of fat and tissue between skin and muscle. The injection is given with a single-use syringe that is already filled with medicine (prefilled syringe). Read the medicine guide or package insert that came with the syringe. Follow directions from the  guide about how to prepare and give the injection. This is important because the directions may be different for each medicine. Use only the syringe, needle, and medicine that your health care provider prescribes.  Use each prefilled syringe and needle only one time. Supplies needed:  Prefilled syringe with needle. Use the needle length and size (gauge) that your health care provider or pharmacist gives to you.  Alcohol wipes.  Bandage.  A container for syringe disposal. This may be a puncture-proof sharps container or a hard-sided plastic container that has a secure lid, such as an empty laundry detergent bottle. How to choose a site for injection Follow instructions from your health care provider about where to give an injection. Do not inject in the same spot each time. There are five main areas that can be used for injecting. These areas include:  Abdomen. Avoid the area that is within 2 inches (5 cm) of your navel (umbilicus).  Front of thigh.  Upper, outer side of thigh.  Upper, outer side of arm.  Upper, outer part of buttock. How to give an injection using a prefilled syringe  1. Wash your hands with soap and water. If soap and water are not available, use hand sanitizer. 2. Use an alcohol wipe to clean the site where you will be injecting the needle. Let the site air-dry. 3. Remove the plastic cover from the needle on the syringe. Do not let the needle touch anything. 4. Hold the syringe with the needle pointing up. Check the syringe for any remaining air bubbles. If there are air bubbles, flick the syringe with your finger until the air bubbles rise to the top. Then, gently push on the plunger until you can see a drop of medicine appear at the tip of the needle. This will clear any remaining air bubbles from the syringe. 5. Hold the syringe in your writing hand like a pencil. 6. Use your other hand to pinch and hold about an inch (2.5 cm) of skin. Do not directly touch the cleaned part of the skin. 7. Insert the entire needle straight into the fold of skin. The needle should be at a 90-degree angle (perpendicular) to the skin. Push the needle all the way against the skin. The needle may  need to be injected at a 45-degree angle in thin adults or children who have a small amount of body fat. 8. After the needle is completely inserted into the skin, release the skin that you are pinching. Continue to hold the syringe with your writing hand. 9. Use your thumb or index finger of your writing hand to push the plunger all the way into the syringe to inject the medicine. 10. Pull the needle straight out of the skin. 11. Press and hold the alcohol wipe over the injection site until bleeding stops. Do not rub the area. 12. Cover the injection site with a bandage, if needed. How to safely throw away the supplies If you are using a syringe that does not have a safety system for shielding the needle after injection:  Do not recap the needle. Place the syringe and needle in the disposal container. If your syringe has a safety system for shielding the needle after injection:  Firmly push down on the plunger after you complete the injection. The protective sleeve will automatically cover the needle, and you will hear a click. The click means that the needle is safely covered. Follow the disposal regulations for the area where you live. Do  not use any syringe or needle more than one time. Contact a health care provider if:  You have difficulty giving the injection.  You think that the injection was not given correctly.  You have difficulty with any of the supplies.  The medicine causes side effects.  Rashes develop on the skin.  A fever develops.  The condition that is being treated gets worse. Get help right away if: Any of these symptoms develop after the injection is given:  Difficulty breathing.  Chest pain.  A rash over most or all of the body.  Swelling of the lips or tongue.  Difficulty swallowing. Summary  A subcutaneous injection is a shot of medicine that is given into the layer of fat and tissue between skin and muscle.  Read the medicine guide or package  insert that came with the syringe. Follow directions from the guide about how to prepare and give the injection.  Follow instructions from your health care provider about where to give an injection.  Contact a health care provider if you cannot give an injection, you think you gave it incorrectly, or you develop any side effects of the medicine.  Get help right away if any of these develop after an injection: difficulty breathing, chest pain, rash on the body, swelling of the lips or tongue, or difficulty swallowing. This information is not intended to replace advice given to you by your health care provider. Make sure you discuss any questions you have with your health care provider. Document Released: 10/26/2010 Document Revised: 06/05/2018 Document Reviewed: 11/13/2017 Elsevier Patient Education  2020 [employer].

## 2018-11-28 NOTE — Telephone Encounter (Signed)
Scheduled appt per 10/2 los.  Sent a staff message to get calendar mailed out.

## 2018-12-02 DIAGNOSIS — F330 Major depressive disorder, recurrent, mild: Secondary | ICD-10-CM | POA: Diagnosis not present
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
## 2018-12-08 ENCOUNTER — Ambulatory Visit: Payer: BC Managed Care – PPO | Admitting: Obstetrics & Gynecology

## 2018-12-09 DIAGNOSIS — F330 Major depressive disorder, recurrent, mild: Secondary | ICD-10-CM | POA: Diagnosis not present

## 2018-12-11 ENCOUNTER — Other Ambulatory Visit: Payer: Self-pay

## 2018-12-11 DIAGNOSIS — Z23 Encounter for immunization: Secondary | ICD-10-CM | POA: Diagnosis not present

## 2018-12-15 ENCOUNTER — Ambulatory Visit: Payer: BC Managed Care – PPO | Admitting: Obstetrics & Gynecology

## 2018-12-15 ENCOUNTER — Other Ambulatory Visit: Payer: Self-pay

## 2018-12-15 ENCOUNTER — Encounter: Payer: Self-pay | Admitting: Obstetrics & Gynecology

## 2018-12-15 VITALS — BP 90/66 | HR 92 | Temp 96.9°F | Ht 64.0 in | Wt 125.0 lb

## 2018-12-15 DIAGNOSIS — E538 Deficiency of other specified B group vitamins: Secondary | ICD-10-CM | POA: Diagnosis not present

## 2018-12-15 DIAGNOSIS — Z30431 Encounter for routine checking of intrauterine contraceptive device: Secondary | ICD-10-CM

## 2018-12-15 DIAGNOSIS — N921 Excessive and frequent menstruation with irregular cycle: Secondary | ICD-10-CM | POA: Diagnosis not present
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
## 2018-12-16 DIAGNOSIS — F330 Major depressive disorder, recurrent, mild: Secondary | ICD-10-CM | POA: Diagnosis not present

## 2018-12-18 DIAGNOSIS — M62838 Other muscle spasm: Secondary | ICD-10-CM | POA: Diagnosis not present

## 2018-12-18 DIAGNOSIS — M6281 Muscle weakness (generalized): Secondary | ICD-10-CM | POA: Diagnosis not present

## 2018-12-18 DIAGNOSIS — R102 Pelvic and perineal pain: Secondary | ICD-10-CM | POA: Diagnosis not present

## 2018-12-18 DIAGNOSIS — K5900 Constipation, unspecified: Secondary | ICD-10-CM | POA: Diagnosis not present

## 2018-12-19 LAB — ANTI-PARIETAL ANTIBODY: Parietal Cell Ab: 1.5 Units (ref 0.0–20.0)

## 2018-12-19 LAB — INTRINSIC FACTOR ANTIBODIES: Intrinsic Factor Abs, Serum: 1 AU/mL (ref 0.0–1.1)

## 2018-12-22 ENCOUNTER — Telehealth: Payer: Self-pay | Admitting: Hematology

## 2018-12-22 NOTE — Telephone Encounter (Signed)
Returned patient's phone call regarding cancelling an appointment, cancelled per patient's request per 10/26 scheduled message.

## 2018-12-23 DIAGNOSIS — F330 Major depressive disorder, recurrent, mild: Secondary | ICD-10-CM | POA: Diagnosis not present

## 2018-12-24 ENCOUNTER — Inpatient Hospital Stay: Payer: BC Managed Care – PPO

## 2018-12-25 ENCOUNTER — Telehealth: Payer: Self-pay | Admitting: Obstetrics & Gynecology

## 2018-12-25 NOTE — Telephone Encounter (Signed)
Patient is calling regarding B12 injection. Patient is requesting to come into office to have injection administered.

## 2018-12-25 NOTE — Telephone Encounter (Signed)
Spoke with pt. Has question about B12 injection needle size prescribed by her oncologist. Will call where she got the needles for injections at home and will get correct size needle. Pt states will give B12 injection at home and will not need appt at this time.   Encounter closed.

## 2018-12-30 DIAGNOSIS — F330 Major depressive disorder, recurrent, mild: Secondary | ICD-10-CM | POA: Diagnosis not present

## 2019-01-06 DIAGNOSIS — F330 Major depressive disorder, recurrent, mild: Secondary | ICD-10-CM | POA: Diagnosis not present

## 2019-01-13 DIAGNOSIS — F330 Major depressive disorder, recurrent, mild: Secondary | ICD-10-CM | POA: Diagnosis not present

## 2019-01-14 DIAGNOSIS — M6281 Muscle weakness (generalized): Secondary | ICD-10-CM | POA: Diagnosis not present

## 2019-01-14 DIAGNOSIS — M62838 Other muscle spasm: Secondary | ICD-10-CM | POA: Diagnosis not present

## 2019-01-14 DIAGNOSIS — R197 Diarrhea, unspecified: Secondary | ICD-10-CM | POA: Diagnosis not present

## 2019-01-14 DIAGNOSIS — M6289 Other specified disorders of muscle: Secondary | ICD-10-CM | POA: Diagnosis not present

## 2019-01-16 ENCOUNTER — Other Ambulatory Visit: Payer: Self-pay

## 2019-01-16 DIAGNOSIS — Z20822 Contact with and (suspected) exposure to covid-19: Secondary | ICD-10-CM

## 2019-01-19 LAB — NOVEL CORONAVIRUS, NAA: SARS-CoV-2, NAA: NOT DETECTED

## 2019-01-20 DIAGNOSIS — F330 Major depressive disorder, recurrent, mild: Secondary | ICD-10-CM | POA: Diagnosis not present

## 2019-01-28 ENCOUNTER — Telehealth: Payer: Self-pay | Admitting: Obstetrics & Gynecology

## 2019-01-28 DIAGNOSIS — M6289 Other specified disorders of muscle: Secondary | ICD-10-CM | POA: Diagnosis not present

## 2019-01-28 DIAGNOSIS — N941 Unspecified dyspareunia: Secondary | ICD-10-CM | POA: Diagnosis not present

## 2019-01-28 DIAGNOSIS — M6281 Muscle weakness (generalized): Secondary | ICD-10-CM | POA: Diagnosis not present

## 2019-01-28 DIAGNOSIS — M62838 Other muscle spasm: Secondary | ICD-10-CM | POA: Diagnosis not present

## 2019-01-28 NOTE — Telephone Encounter (Signed)
Patient has not been feeling well and want to come in to have her iron check.

## 2019-01-28 NOTE — Telephone Encounter (Signed)
Call to patient. Patient states that she has a known problem of chronic iron malabsorption. Patient states her hematologist will only see her every 3-6 months, but that she had discussed with Dr. Sabra Heck having her blood work done every 3 months. States she takes B12 injections monthly and is due for injection today. Patient asking for lab appointment. Lab appointment scheduled for 01-30-2019 at 1415. RN advised patient Dr. Sabra Heck is out of the office today and would review upon return. Would return call if any additional recommendations from Dr. Sabra Heck. Patient agreeable.   Dr. Sabra Heck please advise on labs for patient for future orders.

## 2019-01-29 DIAGNOSIS — F330 Major depressive disorder, recurrent, mild: Secondary | ICD-10-CM | POA: Diagnosis not present

## 2019-01-30 ENCOUNTER — Other Ambulatory Visit: Payer: Self-pay | Admitting: Obstetrics & Gynecology

## 2019-01-30 ENCOUNTER — Other Ambulatory Visit: Payer: Self-pay

## 2019-01-30 ENCOUNTER — Other Ambulatory Visit (INDEPENDENT_AMBULATORY_CARE_PROVIDER_SITE_OTHER): Payer: BC Managed Care – PPO

## 2019-01-30 DIAGNOSIS — K9049 Malabsorption due to intolerance, not elsewhere classified: Secondary | ICD-10-CM | POA: Diagnosis not present

## 2019-01-30 NOTE — Telephone Encounter (Signed)
Pt in today for iron levels.  Orders placed by Dr Sabra Heck.   Will close encounter.

## 2019-01-30 NOTE — Progress Notes (Signed)
Orders for iron studies placed.

## 2019-01-31 LAB — IRON,TIBC AND FERRITIN PANEL
Ferritin: 520 ng/mL — ABNORMAL HIGH (ref 15–150)
Iron Saturation: 32 % (ref 15–55)
Iron: 81 ug/dL (ref 27–159)
Total Iron Binding Capacity: 256 ug/dL (ref 250–450)
UIBC: 175 ug/dL (ref 131–425)

## 2019-02-03 DIAGNOSIS — F330 Major depressive disorder, recurrent, mild: Secondary | ICD-10-CM | POA: Diagnosis not present

## 2019-02-10 DIAGNOSIS — F330 Major depressive disorder, recurrent, mild: Secondary | ICD-10-CM | POA: Diagnosis not present

## 2019-02-12 DIAGNOSIS — E119 Type 2 diabetes mellitus without complications: Secondary | ICD-10-CM | POA: Diagnosis not present

## 2019-02-12 DIAGNOSIS — E039 Hypothyroidism, unspecified: Secondary | ICD-10-CM | POA: Diagnosis not present

## 2019-02-12 DIAGNOSIS — E785 Hyperlipidemia, unspecified: Secondary | ICD-10-CM | POA: Diagnosis not present

## 2019-02-12 DIAGNOSIS — G43909 Migraine, unspecified, not intractable, without status migrainosus: Secondary | ICD-10-CM | POA: Diagnosis not present

## 2019-02-13 DIAGNOSIS — L700 Acne vulgaris: Secondary | ICD-10-CM | POA: Diagnosis not present

## 2019-02-13 DIAGNOSIS — D2271 Melanocytic nevi of right lower limb, including hip: Secondary | ICD-10-CM | POA: Diagnosis not present

## 2019-02-13 DIAGNOSIS — D225 Melanocytic nevi of trunk: Secondary | ICD-10-CM | POA: Diagnosis not present

## 2019-02-16 DIAGNOSIS — E119 Type 2 diabetes mellitus without complications: Secondary | ICD-10-CM | POA: Diagnosis not present

## 2019-02-17 ENCOUNTER — Ambulatory Visit: Payer: BC Managed Care – PPO | Attending: Internal Medicine

## 2019-02-17 DIAGNOSIS — Z20828 Contact with and (suspected) exposure to other viral communicable diseases: Secondary | ICD-10-CM | POA: Diagnosis not present

## 2019-02-17 DIAGNOSIS — F330 Major depressive disorder, recurrent, mild: Secondary | ICD-10-CM | POA: Diagnosis not present

## 2019-02-17 DIAGNOSIS — Z20822 Contact with and (suspected) exposure to covid-19: Secondary | ICD-10-CM

## 2019-02-19 LAB — NOVEL CORONAVIRUS, NAA: SARS-CoV-2, NAA: NOT DETECTED

## 2019-03-03 DIAGNOSIS — F330 Major depressive disorder, recurrent, mild: Secondary | ICD-10-CM | POA: Diagnosis not present

## 2019-03-04 DIAGNOSIS — E119 Type 2 diabetes mellitus without complications: Secondary | ICD-10-CM | POA: Diagnosis not present

## 2019-03-10 DIAGNOSIS — F330 Major depressive disorder, recurrent, mild: Secondary | ICD-10-CM | POA: Diagnosis not present

## 2019-03-11 DIAGNOSIS — R3911 Hesitancy of micturition: Secondary | ICD-10-CM | POA: Diagnosis not present

## 2019-03-11 DIAGNOSIS — N941 Unspecified dyspareunia: Secondary | ICD-10-CM | POA: Diagnosis not present

## 2019-03-11 DIAGNOSIS — R102 Pelvic and perineal pain: Secondary | ICD-10-CM | POA: Diagnosis not present

## 2019-03-11 DIAGNOSIS — L910 Hypertrophic scar: Secondary | ICD-10-CM | POA: Diagnosis not present

## 2019-03-17 DIAGNOSIS — F330 Major depressive disorder, recurrent, mild: Secondary | ICD-10-CM | POA: Diagnosis not present

## 2019-03-19 DIAGNOSIS — R3911 Hesitancy of micturition: Secondary | ICD-10-CM | POA: Diagnosis not present

## 2019-03-19 DIAGNOSIS — L910 Hypertrophic scar: Secondary | ICD-10-CM | POA: Diagnosis not present

## 2019-03-19 DIAGNOSIS — M6281 Muscle weakness (generalized): Secondary | ICD-10-CM | POA: Diagnosis not present

## 2019-03-19 DIAGNOSIS — K5900 Constipation, unspecified: Secondary | ICD-10-CM | POA: Diagnosis not present

## 2019-03-23 DIAGNOSIS — F3342 Major depressive disorder, recurrent, in full remission: Secondary | ICD-10-CM | POA: Diagnosis not present

## 2019-03-23 DIAGNOSIS — F410 Panic disorder [episodic paroxysmal anxiety] without agoraphobia: Secondary | ICD-10-CM | POA: Diagnosis not present

## 2019-03-24 DIAGNOSIS — F330 Major depressive disorder, recurrent, mild: Secondary | ICD-10-CM | POA: Diagnosis not present

## 2019-03-31 DIAGNOSIS — F330 Major depressive disorder, recurrent, mild: Secondary | ICD-10-CM | POA: Diagnosis not present

## 2019-04-07 DIAGNOSIS — F330 Major depressive disorder, recurrent, mild: Secondary | ICD-10-CM | POA: Diagnosis not present

## 2019-04-14 DIAGNOSIS — F330 Major depressive disorder, recurrent, mild: Secondary | ICD-10-CM | POA: Diagnosis not present

## 2019-04-21 DIAGNOSIS — F330 Major depressive disorder, recurrent, mild: Secondary | ICD-10-CM | POA: Diagnosis not present

## 2019-04-23 DIAGNOSIS — R102 Pelvic and perineal pain: Secondary | ICD-10-CM | POA: Diagnosis not present

## 2019-04-23 DIAGNOSIS — M6281 Muscle weakness (generalized): Secondary | ICD-10-CM | POA: Diagnosis not present

## 2019-04-23 DIAGNOSIS — R197 Diarrhea, unspecified: Secondary | ICD-10-CM | POA: Diagnosis not present

## 2019-04-23 DIAGNOSIS — R3911 Hesitancy of micturition: Secondary | ICD-10-CM | POA: Diagnosis not present

## 2019-04-28 DIAGNOSIS — F330 Major depressive disorder, recurrent, mild: Secondary | ICD-10-CM | POA: Diagnosis not present

## 2019-05-05 DIAGNOSIS — F330 Major depressive disorder, recurrent, mild: Secondary | ICD-10-CM | POA: Diagnosis not present

## 2019-05-12 DIAGNOSIS — F330 Major depressive disorder, recurrent, mild: Secondary | ICD-10-CM | POA: Diagnosis not present

## 2019-05-13 DIAGNOSIS — L910 Hypertrophic scar: Secondary | ICD-10-CM | POA: Diagnosis not present

## 2019-05-13 DIAGNOSIS — R102 Pelvic and perineal pain: Secondary | ICD-10-CM | POA: Diagnosis not present

## 2019-05-13 DIAGNOSIS — K5900 Constipation, unspecified: Secondary | ICD-10-CM | POA: Diagnosis not present

## 2019-05-13 DIAGNOSIS — R197 Diarrhea, unspecified: Secondary | ICD-10-CM | POA: Diagnosis not present

## 2019-05-18 ENCOUNTER — Encounter: Payer: Self-pay | Admitting: Certified Nurse Midwife

## 2019-05-18 ENCOUNTER — Telehealth: Payer: Self-pay | Admitting: Hematology

## 2019-05-18 NOTE — Telephone Encounter (Signed)
R/s per MD. Hulen Skains and left a msg. Mailing printout

## 2019-05-19 DIAGNOSIS — F330 Major depressive disorder, recurrent, mild: Secondary | ICD-10-CM | POA: Diagnosis not present

## 2019-05-26 DIAGNOSIS — F330 Major depressive disorder, recurrent, mild: Secondary | ICD-10-CM | POA: Diagnosis not present

## 2019-05-29 ENCOUNTER — Ambulatory Visit: Payer: BC Managed Care – PPO | Admitting: Hematology

## 2019-05-29 ENCOUNTER — Other Ambulatory Visit: Payer: BC Managed Care – PPO

## 2019-06-02 DIAGNOSIS — F330 Major depressive disorder, recurrent, mild: Secondary | ICD-10-CM | POA: Diagnosis not present

## 2019-06-04 DIAGNOSIS — M65311 Trigger thumb, right thumb: Secondary | ICD-10-CM | POA: Diagnosis not present

## 2019-06-04 DIAGNOSIS — M79644 Pain in right finger(s): Secondary | ICD-10-CM | POA: Diagnosis not present

## 2019-06-09 DIAGNOSIS — F330 Major depressive disorder, recurrent, mild: Secondary | ICD-10-CM | POA: Diagnosis not present

## 2019-06-11 DIAGNOSIS — M6289 Other specified disorders of muscle: Secondary | ICD-10-CM | POA: Diagnosis not present

## 2019-06-11 DIAGNOSIS — M6281 Muscle weakness (generalized): Secondary | ICD-10-CM | POA: Diagnosis not present

## 2019-06-11 DIAGNOSIS — R102 Pelvic and perineal pain: Secondary | ICD-10-CM | POA: Diagnosis not present

## 2019-06-11 DIAGNOSIS — R3911 Hesitancy of micturition: Secondary | ICD-10-CM | POA: Diagnosis not present

## 2019-06-12 ENCOUNTER — Inpatient Hospital Stay: Payer: BC Managed Care – PPO

## 2019-06-12 ENCOUNTER — Telehealth: Payer: Self-pay | Admitting: *Deleted

## 2019-06-12 ENCOUNTER — Inpatient Hospital Stay: Payer: BC Managed Care – PPO | Admitting: Hematology

## 2019-06-12 NOTE — Telephone Encounter (Signed)
Patient called - asked to cancel 4/16 appointments for lab and Dr. Irene Limbo. She asked to reschedule next week or week after. Informed her that today's appointments will be cancelled and a scheduler will contact her to reschedule. Schedule message sent

## 2019-06-16 ENCOUNTER — Telehealth: Payer: Self-pay

## 2019-06-16 ENCOUNTER — Telehealth: Payer: Self-pay | Admitting: Hematology

## 2019-06-16 DIAGNOSIS — F330 Major depressive disorder, recurrent, mild: Secondary | ICD-10-CM | POA: Diagnosis not present

## 2019-06-16 NOTE — Telephone Encounter (Signed)
Patient called in regards to problems of having irregular cramps and migraines.  Patient stated that the problems could be associated with IUD. Patient would like to discuss with nurse.

## 2019-06-16 NOTE — Telephone Encounter (Signed)
Called pt per 4/16 sch message - unable to reach pt . Left message for pt to call back to reschedule appt.

## 2019-06-17 NOTE — Telephone Encounter (Signed)
[name] 11/2018 Denise Kerr placed 09/2018  Iron def anemia hx, seen by Dr Irene Limbo, has next appt on 07/02/19 Not on HRT, had oral ERT in past with suicidal ideations.   Last hormone labs in 05/08/2018= estradiol 68.9, FSH 45.0, TSH 1.350  Spoke with pt. Pt reports having irregular cramps, bloating, decreased libido and migraines in the last 2 months around the time her cycle would start. Pt has Dragoon Kerr and does not having bleeding since insertion. Pt denies any vaginal bleeding with new sx the last 2 months. Pt states takes Nurtec and Advil for migraines, which helps, but pt states has noticed the migraines are lasting longer and hurt more and getting harder to control.   Pt requesting OV to discuss possible hormone changes and get new labs drawn and next steps in plan of care. Pt scheduled with Dr Denise Kerr on 06/29/2019 at 1130 am. Pt agreeable and verbalized understanding of date and time of appt.   Routing to Dr Denise Kerr for review.  Encounter closed.

## 2019-06-23 DIAGNOSIS — F330 Major depressive disorder, recurrent, mild: Secondary | ICD-10-CM | POA: Diagnosis not present
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
## 2019-06-26 ENCOUNTER — Other Ambulatory Visit: Payer: Self-pay

## 2019-06-29 ENCOUNTER — Ambulatory Visit: Payer: BC Managed Care – PPO | Admitting: Obstetrics & Gynecology

## 2019-06-29 DIAGNOSIS — F330 Major depressive disorder, recurrent, mild: Secondary | ICD-10-CM | POA: Diagnosis not present
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
## 2019-07-01 DIAGNOSIS — L910 Hypertrophic scar: Secondary | ICD-10-CM | POA: Diagnosis not present

## 2019-07-01 DIAGNOSIS — R102 Pelvic and perineal pain: Secondary | ICD-10-CM | POA: Diagnosis not present

## 2019-07-01 DIAGNOSIS — K5900 Constipation, unspecified: Secondary | ICD-10-CM | POA: Diagnosis not present

## 2019-07-01 DIAGNOSIS — R197 Diarrhea, unspecified: Secondary | ICD-10-CM | POA: Diagnosis not present
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
## 2019-07-02 ENCOUNTER — Inpatient Hospital Stay: Payer: BC Managed Care – PPO | Attending: Hematology

## 2019-07-02 ENCOUNTER — Inpatient Hospital Stay: Payer: BC Managed Care – PPO | Admitting: Hematology

## 2019-07-02 ENCOUNTER — Other Ambulatory Visit: Payer: Self-pay

## 2019-07-02 VITALS — BP 96/64 | HR 110 | Temp 98.0°F | Resp 18 | Ht 64.0 in | Wt 126.8 lb

## 2019-07-02 DIAGNOSIS — D509 Iron deficiency anemia, unspecified: Secondary | ICD-10-CM | POA: Diagnosis not present

## 2019-07-02 DIAGNOSIS — Z7984 Long term (current) use of oral hypoglycemic drugs: Secondary | ICD-10-CM | POA: Insufficient documentation

## 2019-07-02 DIAGNOSIS — Z833 Family history of diabetes mellitus: Secondary | ICD-10-CM | POA: Insufficient documentation

## 2019-07-02 DIAGNOSIS — Z79899 Other long term (current) drug therapy: Secondary | ICD-10-CM | POA: Diagnosis not present

## 2019-07-02 DIAGNOSIS — E538 Deficiency of other specified B group vitamins: Secondary | ICD-10-CM

## 2019-07-02 DIAGNOSIS — D500 Iron deficiency anemia secondary to blood loss (chronic): Secondary | ICD-10-CM | POA: Diagnosis not present

## 2019-07-02 DIAGNOSIS — Z8042 Family history of malignant neoplasm of prostate: Secondary | ICD-10-CM | POA: Insufficient documentation

## 2019-07-02 DIAGNOSIS — Z8249 Family history of ischemic heart disease and other diseases of the circulatory system: Secondary | ICD-10-CM | POA: Diagnosis not present

## 2019-07-02 DIAGNOSIS — Z800 Family history of malignant neoplasm of digestive organs: Secondary | ICD-10-CM | POA: Diagnosis not present

## 2019-07-02 DIAGNOSIS — K909 Intestinal malabsorption, unspecified: Secondary | ICD-10-CM | POA: Insufficient documentation

## 2019-07-02 DIAGNOSIS — Z87891 Personal history of nicotine dependence: Secondary | ICD-10-CM | POA: Diagnosis not present

## 2019-07-02 DIAGNOSIS — E119 Type 2 diabetes mellitus without complications: Secondary | ICD-10-CM | POA: Insufficient documentation

## 2019-07-02 DIAGNOSIS — F419 Anxiety disorder, unspecified: Secondary | ICD-10-CM | POA: Insufficient documentation

## 2019-07-02 DIAGNOSIS — E039 Hypothyroidism, unspecified: Secondary | ICD-10-CM | POA: Diagnosis not present

## 2019-07-02 DIAGNOSIS — M65311 Trigger thumb, right thumb: Secondary | ICD-10-CM | POA: Diagnosis not present

## 2019-07-02 DIAGNOSIS — Z823 Family history of stroke: Secondary | ICD-10-CM | POA: Diagnosis not present

## 2019-07-02 LAB — CMP (CANCER CENTER ONLY)
ALT: 20 U/L (ref 0–44)
AST: 17 U/L (ref 15–41)
Albumin: 4.4 g/dL (ref 3.5–5.0)
Alkaline Phosphatase: 79 U/L (ref 38–126)
Anion gap: 13 (ref 5–15)
BUN: 14 mg/dL (ref 6–20)
CO2: 27 mmol/L (ref 22–32)
Calcium: 9.8 mg/dL (ref 8.9–10.3)
Chloride: 99 mmol/L (ref 98–111)
Creatinine: 0.96 mg/dL (ref 0.44–1.00)
GFR, Est AFR Am: >60 mL/min (ref >60)
GFR, Estimated: >60 mL/min (ref >60)
Glucose, Bld: 194 mg/dL — ABNORMAL HIGH (ref 70–99)
Potassium: 4.3 mmol/L (ref 3.5–5.1)
Sodium: 139 mmol/L (ref 135–145)
Total Bilirubin: 0.6 mg/dL (ref 0.3–1.2)
Total Protein: 7.4 g/dL (ref 6.5–8.1)

## 2019-07-02 LAB — CBC WITH DIFFERENTIAL/PLATELET
Abs Immature Granulocytes: 0.01 10*3/uL (ref 0.00–0.07)
Basophils Absolute: 0.1 10*3/uL (ref 0.0–0.1)
Basophils Relative: 1 %
Eosinophils Absolute: 0.1 10*3/uL (ref 0.0–0.5)
Eosinophils Relative: 2 %
HCT: 43 % (ref 36.0–46.0)
Hemoglobin: 14.2 g/dL (ref 12.0–15.0)
Immature Granulocytes: 0 %
Lymphocytes Relative: 26 %
Lymphs Abs: 1.5 10*3/uL (ref 0.7–4.0)
MCH: 30.7 pg (ref 26.0–34.0)
MCHC: 33 g/dL (ref 30.0–36.0)
MCV: 92.9 fL (ref 80.0–100.0)
Monocytes Absolute: 0.4 10*3/uL (ref 0.1–1.0)
Monocytes Relative: 7 %
Neutro Abs: 3.8 10*3/uL (ref 1.7–7.7)
Neutrophils Relative %: 64 %
Platelets: 235 10*3/uL (ref 150–400)
RBC: 4.63 MIL/uL (ref 3.87–5.11)
RDW: 12.6 % (ref 11.5–15.5)
WBC: 5.9 10*3/uL (ref 4.0–10.5)
nRBC: 0 % (ref 0.0–0.2)

## 2019-07-02 LAB — IRON AND TIBC
Iron: 72 ug/dL (ref 41–142)
Saturation Ratios: 26 % (ref 21–57)
TIBC: 278 ug/dL (ref 236–444)
UIBC: 205 ug/dL (ref 120–384)

## 2019-07-02 LAB — VITAMIN B12: Vitamin B-12: 356 pg/mL (ref 180–914)

## 2019-07-02 LAB — FERRITIN: Ferritin: 336 ng/mL — ABNORMAL HIGH (ref 11–307)

## 2019-07-02 MED ORDER — CYANOCOBALAMIN 1000 MCG/ML IJ SOLN: 1000.0000 ug | INTRAMUSCULAR | 1 refills | Status: DC

## 2019-07-02 NOTE — Patient Instructions (Signed)
Thank you for choosing [facility] to provide your oncology and hematology care.   Should you have questions after your visit to the [facility] ([facility]), please contact this office at [phone] between 8:30 AM and 4:30 PM.  Voice mails left after 4:00 PM may not be returned until the following business day.  Calls received after 4:30 PM will be answered by an off-site Nurse Triage Line.    Prescription Refills:  Please have your pharmacy contact us directly for most prescription requests.  Contact the office directly for refills of narcotics (pain medications). Allow 48-72 hours for refills.  Appointments: Please contact the [facility] scheduling department [phone] for questions regarding [facility] appointment scheduling.  Contact the schedulers with any scheduling changes so that your appointment can be rescheduled in a timely manner.   Central Scheduling for [facility] [phone] - Call to schedule procedures such as PET scans, CT scans, MRI, Ultrasound, etc.  To afford each patient quality time with our providers, please arrive 30 minutes before your scheduled appointment time.  If you arrive late for your appointment, you may be asked to reschedule.  We strive to give you quality time with our providers, and arriving late affects you and other patients whose appointments are after yours. If you are a no show for multiple scheduled visits, you may be dismissed from the clinic at the providers discretion.     Resources: [facility] Social Workers [phone] for additional information on assistance programs or assistance connecting with community support programs   [place] DSS  336-641-3447: Information regarding food stamps, Medicaid, and utility assistance [place] 336-333-6589   [place] [employer]'s shared-ride transportation service for eligible riders who have a disability that prevents them from riding the fixed route bus.   Medicare Rights Center  800-333-4114 Helps people with Medicare understand their rights and benefits, navigate the Medicare system, and secure the quality healthcare they deserve [employer] 800-227-2345 Assists patients locate various types of support and financial assistance Cancer Care: 1-800-813-HOPE (4673) Provides financial assistance, online support groups, medication/co-pay assistance.   Transportation Assistance for appointments at [facility]: Transportation Coordinator [phone]  Again, thank you for choosing  Cancer Center for your care.       

## 2019-07-03 ENCOUNTER — Other Ambulatory Visit: Payer: Self-pay

## 2019-07-03 ENCOUNTER — Encounter: Payer: Self-pay | Admitting: Obstetrics & Gynecology

## 2019-07-03 ENCOUNTER — Ambulatory Visit: Payer: BC Managed Care – PPO | Admitting: Obstetrics & Gynecology

## 2019-07-03 VITALS — BP 110/7 | HR 68 | Temp 97.2°F | Resp 16 | Wt 125.0 lb

## 2019-07-03 DIAGNOSIS — N9089 Other specified noninflammatory disorders of vulva and perineum: Secondary | ICD-10-CM

## 2019-07-03 DIAGNOSIS — N951 Menopausal and female climacteric states: Secondary | ICD-10-CM

## 2019-07-03 LAB — ANTI-PARIETAL ANTIBODY: Parietal Cell Antibody-IgG: 3.5 Units (ref 0.0–20.0)

## 2019-07-03 LAB — INTRINSIC FACTOR ANTIBODIES: Intrinsic Factor: 0.9 AU/mL (ref 0.0–1.1)

## 2019-07-03 MED ORDER — TERCONAZOLE 0.4 % VA CREA: TOPICAL_CREAM | VAGINAL | 0 refills | Status: DC

## 2019-07-03 MED ORDER — FLUCONAZOLE 150 MG PO TABS
150.0000 mg | ORAL_TABLET | Freq: Once | ORAL | 3 refills | Status: AC
Start: 2019-07-03 — End: 2019-07-03

## 2019-07-03 MED ORDER — NONFORMULARY OR COMPOUNDED ITEM
3 refills | Status: DC
Start: 2019-07-03 — End: 2022-06-22

## 2019-07-04 LAB — VAGINITIS/VAGINOSIS, DNA PROBE
Candida Species: NEGATIVE
Gardnerella vaginalis: NEGATIVE
Trichomonas vaginosis: NEGATIVE

## 2019-07-05 LAB — FOLLICLE STIMULATING HORMONE: FSH: 143 m[IU]/mL

## 2019-07-05 LAB — ESTRADIOL: Estradiol: <5 pg/mL

## 2019-07-07 DIAGNOSIS — F330 Major depressive disorder, recurrent, mild: Secondary | ICD-10-CM | POA: Diagnosis not present

## 2019-07-14 DIAGNOSIS — F330 Major depressive disorder, recurrent, mild: Secondary | ICD-10-CM | POA: Diagnosis not present

## 2019-07-20 ENCOUNTER — Telehealth: Payer: Self-pay

## 2019-07-20 NOTE — Telephone Encounter (Signed)
Left message to call Sharee Pimple, RN at [facility].    See 07/03/19 results

## 2019-07-20 NOTE — Telephone Encounter (Signed)
Patient is calling regarding results she received. Patient states the notes say, Dr. Sabra Heck would like to sit down to discuss the results. Patient would like to know what are the next steps.

## 2019-07-21 DIAGNOSIS — F330 Major depressive disorder, recurrent, mild: Secondary | ICD-10-CM | POA: Diagnosis not present

## 2019-07-22 NOTE — Telephone Encounter (Signed)
Spoke with patient. Patient request MyChart visit to further discuss 07/03/19 lab results. MyChart visit scheduled for 6/7 at 11:30AM with Dr. Sabra Heck. Patient declined earlier OV offered.   Routing to provider for final review. Patient is agreeable to disposition. Will close encounter.

## 2019-07-22 NOTE — Telephone Encounter (Signed)
Patient returned call

## 2019-07-28 ENCOUNTER — Telehealth: Payer: Self-pay | Admitting: Hematology

## 2019-07-28 NOTE — Telephone Encounter (Signed)
Scheduled per 05/06 los, patient has been called and voicemail has been left.

## 2019-07-30 DIAGNOSIS — M65311 Trigger thumb, right thumb: Secondary | ICD-10-CM | POA: Diagnosis not present

## 2019-07-30 DIAGNOSIS — M79644 Pain in right finger(s): Secondary | ICD-10-CM | POA: Diagnosis not present

## 2019-08-03 ENCOUNTER — Telehealth (INDEPENDENT_AMBULATORY_CARE_PROVIDER_SITE_OTHER): Payer: BC Managed Care – PPO | Admitting: Obstetrics & Gynecology

## 2019-08-03 ENCOUNTER — Other Ambulatory Visit: Payer: Self-pay

## 2019-08-03 ENCOUNTER — Encounter: Payer: Self-pay | Admitting: Obstetrics & Gynecology

## 2019-08-03 DIAGNOSIS — N951 Menopausal and female climacteric states: Secondary | ICD-10-CM | POA: Diagnosis not present

## 2019-08-03 NOTE — Progress Notes (Signed)
Virtual Visit via Telephone Note  I connected with Denise Kerr on 08/03/19 at 11:30 AM EDT by telephone and verified that I am speaking with the correct person using two identifiers.  Location: Patient: home Provider: office   I discussed the limitations, risks, security and privacy concerns of performing an evaluation and management service by telephone and the availability of in person appointments. I also discussed with the patient that there may be a patient responsible charge related to this service. The patient expressed understanding and agreed to proceed.  History of Present Illness: 49 yo G0 SWF with hx of menopausal symptoms including vaginal dryness, vaginal irritation, decreased libido, and no bleeding (but pt has Mirena IUD).  These were discussed at [facility] on 07/03/2019.  FSH and estradiols obtained.  [place] >150.  Estradiol <5.  Pt notified and was desirous of discussion of results and options.  Web based visit was scheduled but pt appeared to not be on-line so I called her.  She desired continuing with phone conversation.  Blood work results discussed.  Symptoms of menopause discussed.  HRT treatment options discussed.  Because pt has a Mirena IUD, would only need to use estrogen for HRT.  Risks and benefits discussed.  She's never been pregnancy.  She does have very well controlled diabetes.  She is not sure she wants to start HRT but does want to consider options.  Libido treatment options also reviewed.  Pt aware I would recommend transdermal methods if possible for her due to GI issues of the past several years (diarrhea).  Separately, she is having issues with her thumb on her dominant hand and has been seeing Dr. Apolonio Schneiders at [facility].  Surgical procedure on hand has been recommended.  She's done several other treatments including PT, injections, brace wearing.  Wants to know my opinion.  She clearly understands this is not my area of expertise but advised I, personally, wouldn't  have anything done on my hand or to a joint without a second opinion.  She hadn't thought of this and is grateful for the suggestion.  Names given for her to consider for second opinion.   Observations/Objective: Phone consult only so I could not see her  Assessment and Plan: Menopausal symptoms  Follow Up Instructions: Options for treatment discussed including risks and benefits.  She wants to do some research and let me know her decision.  Estradiol patch discussed at smaller dosage suggested if she decides she wants to proceed.  Knows to call if need referral to hand surgeon.   I discussed the assessment and treatment plan with the patient. The patient was provided an opportunity to ask questions and all were answered. The patient agreed with the plan and demonstrated an understanding of the instructions.   The patient was advised to call back or seek an in-person evaluation if the symptoms worsen or if the condition fails to improve as anticipated.  I provided 38 minutes of non-face-to-face time during this encounter.   Megan Salon, MD

## 2019-08-04 DIAGNOSIS — F330 Major depressive disorder, recurrent, mild: Secondary | ICD-10-CM | POA: Diagnosis not present

## 2019-08-11 DIAGNOSIS — Z0000 Encounter for general adult medical examination without abnormal findings: Secondary | ICD-10-CM | POA: Diagnosis not present

## 2019-08-11 DIAGNOSIS — E538 Deficiency of other specified B group vitamins: Secondary | ICD-10-CM | POA: Diagnosis not present

## 2019-08-11 DIAGNOSIS — E119 Type 2 diabetes mellitus without complications: Secondary | ICD-10-CM | POA: Diagnosis not present

## 2019-08-11 DIAGNOSIS — F330 Major depressive disorder, recurrent, mild: Secondary | ICD-10-CM | POA: Diagnosis not present

## 2019-08-11 DIAGNOSIS — E038 Other specified hypothyroidism: Secondary | ICD-10-CM | POA: Diagnosis not present

## 2019-08-11 DIAGNOSIS — E7849 Other hyperlipidemia: Secondary | ICD-10-CM | POA: Diagnosis not present

## 2019-08-12 DIAGNOSIS — R82998 Other abnormal findings in urine: Secondary | ICD-10-CM | POA: Diagnosis not present

## 2019-08-13 DIAGNOSIS — R102 Pelvic and perineal pain: Secondary | ICD-10-CM | POA: Diagnosis not present

## 2019-08-13 DIAGNOSIS — M62838 Other muscle spasm: Secondary | ICD-10-CM | POA: Diagnosis not present

## 2019-08-13 DIAGNOSIS — M6281 Muscle weakness (generalized): Secondary | ICD-10-CM | POA: Diagnosis not present

## 2019-08-13 DIAGNOSIS — K5900 Constipation, unspecified: Secondary | ICD-10-CM | POA: Diagnosis not present

## 2019-08-18 DIAGNOSIS — G43909 Migraine, unspecified, not intractable, without status migrainosus: Secondary | ICD-10-CM | POA: Diagnosis not present

## 2019-08-18 DIAGNOSIS — E039 Hypothyroidism, unspecified: Secondary | ICD-10-CM | POA: Diagnosis not present

## 2019-08-18 DIAGNOSIS — E119 Type 2 diabetes mellitus without complications: Secondary | ICD-10-CM | POA: Diagnosis not present

## 2019-08-18 DIAGNOSIS — F330 Major depressive disorder, recurrent, mild: Secondary | ICD-10-CM | POA: Diagnosis not present

## 2019-08-18 DIAGNOSIS — Z0000 Encounter for general adult medical examination without abnormal findings: Secondary | ICD-10-CM | POA: Diagnosis not present

## 2019-08-18 DIAGNOSIS — E785 Hyperlipidemia, unspecified: Secondary | ICD-10-CM | POA: Diagnosis not present

## 2019-08-24 DIAGNOSIS — M65311 Trigger thumb, right thumb: Secondary | ICD-10-CM | POA: Diagnosis not present

## 2019-08-24 DIAGNOSIS — M79644 Pain in right finger(s): Secondary | ICD-10-CM | POA: Diagnosis not present

## 2019-08-25 DIAGNOSIS — F330 Major depressive disorder, recurrent, mild: Secondary | ICD-10-CM | POA: Diagnosis not present

## 2019-09-01 DIAGNOSIS — F330 Major depressive disorder, recurrent, mild: Secondary | ICD-10-CM | POA: Diagnosis not present

## 2019-09-02 ENCOUNTER — Telehealth: Payer: Self-pay | Admitting: Obstetrics & Gynecology

## 2019-09-02 NOTE — Telephone Encounter (Signed)
Spoke with pt. Pt had question re: abx of Keflex for trigger surgery on Friday. Pt had resolved question with Dr Burney Gauze and changed abx to doxycycline.   Encounter closed.

## 2019-09-02 NOTE — Telephone Encounter (Signed)
Patient is having trigger finger surgery on Friday and is concerned about the antibiotic she was prescribed.  She would like Dr.Miller's opinion.

## 2019-09-03 DIAGNOSIS — L910 Hypertrophic scar: Secondary | ICD-10-CM | POA: Diagnosis not present

## 2019-09-03 DIAGNOSIS — N941 Unspecified dyspareunia: Secondary | ICD-10-CM | POA: Diagnosis not present

## 2019-09-03 DIAGNOSIS — K5900 Constipation, unspecified: Secondary | ICD-10-CM | POA: Diagnosis not present

## 2019-09-03 DIAGNOSIS — R102 Pelvic and perineal pain: Secondary | ICD-10-CM | POA: Diagnosis not present

## 2019-09-04 DIAGNOSIS — M65311 Trigger thumb, right thumb: Secondary | ICD-10-CM | POA: Diagnosis not present

## 2019-09-04 HISTORY — PX: TRIGGER FINGER RELEASE: SHX641

## 2019-09-08 DIAGNOSIS — F330 Major depressive disorder, recurrent, mild: Secondary | ICD-10-CM | POA: Diagnosis not present

## 2019-09-14 DIAGNOSIS — F411 Generalized anxiety disorder: Secondary | ICD-10-CM | POA: Diagnosis not present

## 2019-09-14 DIAGNOSIS — F3342 Major depressive disorder, recurrent, in full remission: Secondary | ICD-10-CM | POA: Diagnosis not present

## 2019-09-15 DIAGNOSIS — F330 Major depressive disorder, recurrent, mild: Secondary | ICD-10-CM | POA: Diagnosis not present

## 2019-09-22 ENCOUNTER — Telehealth: Payer: Self-pay

## 2019-09-22 DIAGNOSIS — F330 Major depressive disorder, recurrent, mild: Secondary | ICD-10-CM | POA: Diagnosis not present

## 2019-09-22 NOTE — Telephone Encounter (Signed)
Patient is calling in regards to wanting to start testosterone patches.

## 2019-09-22 NOTE — Telephone Encounter (Signed)
Left message for pt to return call to triage RN. 

## 2019-09-23 NOTE — Telephone Encounter (Signed)
Spoke with pt. Pt states has made decision to have HRT. Pt states wasn't sure if needs estradiol patch or testosterone patch?  Pt had video visit on 08/03/19 with Dr Sabra Heck and discussed options for HRT. Pt has Mirena IUD and would only need estrogen therapy per reviewed notes. Pt states would like something for libido and to have repeat labs. Pt advised to have OV to discuss further with Dr Sabra Heck. Pt agreeable.  Pt scheduled with Dr Sabra Heck on 10/01/19 at 3 pm. Pt agreeable and verbalized understanding of date and time of appt.   Routing to Dr Sabra Heck for review  Encounter closed.
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
## 2019-09-29 DIAGNOSIS — F330 Major depressive disorder, recurrent, mild: Secondary | ICD-10-CM | POA: Diagnosis not present

## 2019-09-30 DIAGNOSIS — R102 Pelvic and perineal pain: Secondary | ICD-10-CM | POA: Diagnosis not present

## 2019-09-30 DIAGNOSIS — R350 Frequency of micturition: Secondary | ICD-10-CM | POA: Diagnosis not present

## 2019-09-30 DIAGNOSIS — L910 Hypertrophic scar: Secondary | ICD-10-CM | POA: Diagnosis not present

## 2019-09-30 DIAGNOSIS — K5900 Constipation, unspecified: Secondary | ICD-10-CM | POA: Diagnosis not present

## 2019-10-01 ENCOUNTER — Ambulatory Visit (INDEPENDENT_AMBULATORY_CARE_PROVIDER_SITE_OTHER): Payer: BC Managed Care – PPO | Admitting: Obstetrics & Gynecology

## 2019-10-01 ENCOUNTER — Encounter: Payer: Self-pay | Admitting: Obstetrics & Gynecology

## 2019-10-01 ENCOUNTER — Other Ambulatory Visit: Payer: Self-pay

## 2019-10-01 VITALS — BP 110/70 | HR 68 | Resp 16 | Wt 127.0 lb

## 2019-10-01 DIAGNOSIS — N951 Menopausal and female climacteric states: Secondary | ICD-10-CM | POA: Diagnosis not present

## 2019-10-01 DIAGNOSIS — R5383 Other fatigue: Secondary | ICD-10-CM

## 2019-10-01 DIAGNOSIS — R790 Abnormal level of blood mineral: Secondary | ICD-10-CM

## 2019-10-01 DIAGNOSIS — K9089 Other intestinal malabsorption: Secondary | ICD-10-CM

## 2019-10-01 MED ORDER — ESTRADIOL 0.0375 MG/24HR TD PTWK: 0.0375 mg | MEDICATED_PATCH | TRANSDERMAL | 3 refills | Status: DC

## 2019-10-02 LAB — IRON,TIBC AND FERRITIN PANEL
Ferritin: 235 ng/mL — ABNORMAL HIGH (ref 15–150)
Iron Saturation: 38 % (ref 15–55)
Iron: 92 ug/dL (ref 27–159)
Total Iron Binding Capacity: 243 ug/dL — ABNORMAL LOW (ref 250–450)
UIBC: 151 ug/dL (ref 131–425)

## 2019-10-06 DIAGNOSIS — F330 Major depressive disorder, recurrent, mild: Secondary | ICD-10-CM | POA: Diagnosis not present

## 2019-10-07 ENCOUNTER — Telehealth: Payer: Self-pay

## 2019-10-07 NOTE — Telephone Encounter (Signed)
Patient is calling in regards to results.

## 2019-10-07 NOTE — Telephone Encounter (Signed)
She was not anemic at the time of her CBC in 5/21, her recent ferritin level is high so it isn't likely that she has become anemic unless she has had significant bleeding.  If she wants a repeat CBC she can come in for one, I anticipate it will be normal.

## 2019-10-07 NOTE — Telephone Encounter (Signed)
Spoke with patient. Patient reviewed 10/01/19 Iron, TIBC and Ferritin level. Patient states she was also expecting to have a CBC completed that day as well.   Reviewed 10/01/19 OV notes, no CBC ordered.   Advised Dr. Sabra Heck is out of the office this week, can review with covering provider and return call with recommendations. Patient agreeable.   Dr. Talbert Nan -please review and advise on CBC?

## 2019-10-07 NOTE — Telephone Encounter (Signed)
Spoke with patient, advised per Dr. Talbert Nan.  Patient states she just wanted to make sure "everything was covered that they discussed". Declines repeat CBC at this time. Patient thankful for f/u. Advised I will forward to Dr. Sabra Heck to review when she returns, our office will contact her if any additional recommendations. Patient agreeable.   Routing to provider for final review. Patient is agreeable to disposition. Will close encounter.

## 2019-10-13 DIAGNOSIS — F330 Major depressive disorder, recurrent, mild: Secondary | ICD-10-CM | POA: Diagnosis not present

## 2019-10-15 ENCOUNTER — Other Ambulatory Visit: Payer: Self-pay | Admitting: Internal Medicine

## 2019-10-15 DIAGNOSIS — K5900 Constipation, unspecified: Secondary | ICD-10-CM | POA: Diagnosis not present

## 2019-10-15 DIAGNOSIS — Z1231 Encounter for screening mammogram for malignant neoplasm of breast: Secondary | ICD-10-CM

## 2019-10-15 DIAGNOSIS — M6281 Muscle weakness (generalized): Secondary | ICD-10-CM | POA: Diagnosis not present

## 2019-10-15 DIAGNOSIS — R102 Pelvic and perineal pain: Secondary | ICD-10-CM | POA: Diagnosis not present

## 2019-10-15 DIAGNOSIS — M62838 Other muscle spasm: Secondary | ICD-10-CM | POA: Diagnosis not present

## 2019-10-20 DIAGNOSIS — F330 Major depressive disorder, recurrent, mild: Secondary | ICD-10-CM | POA: Diagnosis not present

## 2019-10-26 ENCOUNTER — Ambulatory Visit: Payer: BC Managed Care – PPO

## 2019-10-27 ENCOUNTER — Telehealth: Payer: Self-pay

## 2019-10-27 DIAGNOSIS — F330 Major depressive disorder, recurrent, mild: Secondary | ICD-10-CM | POA: Diagnosis not present

## 2019-10-27 NOTE — Telephone Encounter (Signed)
Patient left message regarding questions about Estradial prescription.

## 2019-10-27 NOTE — Telephone Encounter (Signed)
OV-vasomotor sx 10/01/19. H/o vaginal dryness   Spoke with pt. Pt calling to give update to Dr Sabra Heck. Pt states using estradiol patches 1 per week for the last 3 weeks and really hasn't noticed a difference in her vasomotor sx of hot flashes or night sweats. Pt states vaginal lubrication has improved and much better. Pt asking if needs new patch Rx with higher dosage or use something different?  Advised will review with Dr Sabra Heck and return call. Pt agreeable.   Pt also states having some psych and mental struggles right now is not "feeling herself" and " doesn't care" attitude. Pt states seeing therapist once a week and see psychologist. Denies homicidal and suicidal thoughts. Pt seeing therapist at 3pm today. Pt wanting to make sure all physicians in her care are aware of her meds and updates. Pt has hx of depression and anxiety and is on daily medication regimen.   Routing to Dr Sabra Heck for recommendations, please advise.

## 2019-10-28 MED ORDER — ESTRADIOL 0.05 MG/24HR TD PTWK
0.0500 mg | MEDICATED_PATCH | TRANSDERMAL | 2 refills | Status: DC
Start: 2019-10-28 — End: 2019-11-23

## 2019-10-28 NOTE — Telephone Encounter (Signed)
Spoke with pt. Pt given update about Rx per Dr Sabra Heck. Pt agreeable and verbalized understanding to return call in 3-4 weeks with update.  Pt states is feeling better mentally today and will have a psychology appt at end of week.  Encounter closed.  Rx Climara 0.05mg  patch sent to pharmacy on file.

## 2019-10-28 NOTE — Telephone Encounter (Signed)
It is ok to increase to the Climara 0.05mg  patch.  This is a once weekly patch.  #4/2RF.  Would be good to have update again in 3-4 weeks.  Thanks.

## 2019-11-03 DIAGNOSIS — F330 Major depressive disorder, recurrent, mild: Secondary | ICD-10-CM | POA: Diagnosis not present

## 2019-11-05 ENCOUNTER — Ambulatory Visit: Payer: BC Managed Care – PPO

## 2019-11-10 DIAGNOSIS — F330 Major depressive disorder, recurrent, mild: Secondary | ICD-10-CM | POA: Diagnosis not present

## 2019-11-12 DIAGNOSIS — M62838 Other muscle spasm: Secondary | ICD-10-CM | POA: Diagnosis not present

## 2019-11-12 DIAGNOSIS — R102 Pelvic and perineal pain: Secondary | ICD-10-CM | POA: Diagnosis not present

## 2019-11-12 DIAGNOSIS — L910 Hypertrophic scar: Secondary | ICD-10-CM | POA: Diagnosis not present

## 2019-11-12 DIAGNOSIS — K5900 Constipation, unspecified: Secondary | ICD-10-CM | POA: Diagnosis not present

## 2019-11-16 ENCOUNTER — Telehealth: Payer: Self-pay | Admitting: Obstetrics & Gynecology
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
## 2019-11-16 NOTE — Telephone Encounter (Signed)
Spoke with pt. Pt states calling to give update to Dr Sabra Heck and states concern with having cramping and light BTB bleeding on Saturday 11/14/19. Pt states changed panty liner x once and has had no bleeding since Saturday.  Pt states given increased dosage of estradiol patch that she started on 9/9 and 9/16. Pt states having abd cramps and also having hair thinning. Pt states was really concerned and reached out to Oncall Dr Delilah Shan. (no note in Epic). Pt states feeling ok now, but would like to see Dr Sabra Heck.   Pt advised to have OV for discussion. Pt agreeable. Pt scheduled as work-in 9/21 at 115 pm with Dr Sabra Heck. Pt agreeable and verbalized understanding to date and time of appt.  Encounter closed.

## 2019-11-16 NOTE — Telephone Encounter (Signed)
Reviewed with Dr. Sabra Heck.  Call returned to patient. OV r/s to 9/21 at 11:30am w/ Dr. Sabra Heck. Patient is agreeable to date and time.

## 2019-11-16 NOTE — Telephone Encounter (Signed)
Patient started cramping and spotting over the weekend.

## 2019-11-17 ENCOUNTER — Telehealth: Payer: Self-pay | Admitting: Obstetrics & Gynecology

## 2019-11-17 ENCOUNTER — Ambulatory Visit: Payer: Self-pay | Admitting: Obstetrics & Gynecology

## 2019-11-17 DIAGNOSIS — F330 Major depressive disorder, recurrent, mild: Secondary | ICD-10-CM | POA: Diagnosis not present

## 2019-11-17 NOTE — Telephone Encounter (Signed)
Spoke with pt. Pt states having a sore throat x 2 days and not feeling well so she cancelled appt today. Pt rescheduled for 11/23/19 at 1100 am with Dr Sabra Heck. Pt agreeable to date and time of appt. Pt advised on Covid precautions and to return call to office if positive to reschedule appt further out. Pt agreeable.   Encounter closed.

## 2019-11-17 NOTE — Telephone Encounter (Signed)
Patient cancelled appointment today for HRT consult. She is not feeling well and would like a call to reschedule.

## 2019-11-19 ENCOUNTER — Other Ambulatory Visit: Payer: Self-pay

## 2019-11-19 ENCOUNTER — Ambulatory Visit
Admission: RE | Admit: 2019-11-19 | Discharge: 2019-11-19 | Disposition: A | Discharge status: Home / Self Care | Payer: BC Managed Care – PPO | Source: Ambulatory Visit | Attending: Internal Medicine | Admitting: Internal Medicine

## 2019-11-19 DIAGNOSIS — Z1231 Encounter for screening mammogram for malignant neoplasm of breast: Secondary | ICD-10-CM | POA: Diagnosis not present
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
## 2019-11-20 ENCOUNTER — Telehealth: Payer: Self-pay

## 2019-11-20 DIAGNOSIS — Z20828 Contact with and (suspected) exposure to other viral communicable diseases: Secondary | ICD-10-CM | POA: Diagnosis not present

## 2019-11-20 NOTE — Telephone Encounter (Signed)
Patient had to cancel follow up visit due to being sick. Patient would like to know if the visit could be virtual. Patient states she would like to be called back this afternoon.

## 2019-11-20 NOTE — Telephone Encounter (Signed)
Spoke with patient. Patient is requesting to schedule HRT consult by MyChart.  MyChart visit scheduled for 9/27 at 4pm. Patient is agreeable to date and time.  Encounter closed.

## 2019-11-20 NOTE — Telephone Encounter (Signed)
MyChart message to patient.  

## 2019-11-23 ENCOUNTER — Telehealth (INDEPENDENT_AMBULATORY_CARE_PROVIDER_SITE_OTHER): Payer: BC Managed Care – PPO | Admitting: Obstetrics & Gynecology

## 2019-11-23 ENCOUNTER — Ambulatory Visit: Payer: BC Managed Care – PPO | Admitting: Obstetrics & Gynecology

## 2019-11-23 ENCOUNTER — Encounter: Payer: Self-pay | Admitting: Obstetrics & Gynecology

## 2019-11-23 DIAGNOSIS — N950 Postmenopausal bleeding: Secondary | ICD-10-CM | POA: Diagnosis not present

## 2019-11-23 MED ORDER — ESTRADIOL 0.0375 MG/24HR TD PTWK
0.0375 mg | MEDICATED_PATCH | TRANSDERMAL | 5 refills | Status: DC
Start: 2019-11-23 — End: 2019-12-17

## 2019-11-23 NOTE — Progress Notes (Signed)
Virtual Visit via Video Note  I connected with Denise Kerr on 11/23/19 at  4:00 PM EDT by a video enabled telemedicine application and verified that I am speaking with the correct person using two identifiers.  Location: Patient: home Provider: office   I discussed the limitations of evaluation and management by telemedicine and the availability of in person appointments. The patient expressed understanding and agreed to proceed.  History of Present Illness: 49 yo SWF for discussion of HRT.  She initially started on the 0.0375mg  dosage.  She increased this to the 0.05mg  dosage.  She's had two episodes of spotting.  She's noticed decreased appetite and hair loss.  She has a [place] IUD.  She's frustrated with central weight changes and she thinks her butt has changed sized.  She feels her mood is much flatter.  She typically takes Wellbutrin XL 300mg  daily.     Observations/Objective: Could not see patient due to video dysfunction on her dosage  Assessment and Plan: 49 you G0 SWF with PMP vaginal dryness whose had two episodes of vaginal bleeding Mood changes, depressed  Follow Up Instructions: Will go back to the 0.0375mg  dosage.  She will continue using this weekly.  Rx sent to pharmacy for #1 month supply/5RF. Will continue to use [place] IUD   I discussed the assessment and treatment plan with the patient. The patient was provided an opportunity to ask questions and all were answered. The patient agreed with the plan and demonstrated an understanding of the instructions.   The patient was advised to call back or seek an in-person evaluation if the symptoms worsen or if the condition fails to improve as anticipated.  I provided 23 minutes of non-face-to-face time during this encounter.   Megan Salon, MD

## 2019-11-24 DIAGNOSIS — F330 Major depressive disorder, recurrent, mild: Secondary | ICD-10-CM | POA: Diagnosis not present

## 2019-12-01 DIAGNOSIS — F330 Major depressive disorder, recurrent, mild: Secondary | ICD-10-CM | POA: Diagnosis not present

## 2019-12-02 ENCOUNTER — Telehealth: Payer: Self-pay

## 2019-12-02 NOTE — Telephone Encounter (Signed)
Patient is getting the booster vaccine tomorrow. She wants to know if she can get the shingles and flu shot also at the same time.

## 2019-12-02 NOTE — Telephone Encounter (Signed)
Spoke with pt. Pt states getting Covid booster tomorrow and wants to know if can get flu and shingles vaccines at the same time.  Advised pt per UP to Date and CDC, can get flu and Covid vaccine booster at the same time. Pt advised to ask pharmacy where getting Covid booster on when to get shingles vaccine. Pt agreeable and verbalized understanding . Thankful for advice.  Encounter closed.

## 2019-12-03 DIAGNOSIS — L910 Hypertrophic scar: Secondary | ICD-10-CM | POA: Diagnosis not present

## 2019-12-03 DIAGNOSIS — M6281 Muscle weakness (generalized): Secondary | ICD-10-CM | POA: Diagnosis not present

## 2019-12-03 DIAGNOSIS — K5900 Constipation, unspecified: Secondary | ICD-10-CM | POA: Diagnosis not present

## 2019-12-03 DIAGNOSIS — R3911 Hesitancy of micturition: Secondary | ICD-10-CM | POA: Diagnosis not present

## 2019-12-08 DIAGNOSIS — F330 Major depressive disorder, recurrent, mild: Secondary | ICD-10-CM | POA: Diagnosis not present

## 2019-12-15 ENCOUNTER — Telehealth: Payer: Self-pay

## 2019-12-15 DIAGNOSIS — F330 Major depressive disorder, recurrent, mild: Secondary | ICD-10-CM | POA: Diagnosis not present

## 2019-12-15 NOTE — Telephone Encounter (Signed)
Patient left message regarding issues with HRT medication.

## 2019-12-15 NOTE — Telephone Encounter (Signed)
Each patch lasts 4 days so changing twice weekly is adequate.  She has bleeding on the 0.05mg  dosage so I don't think she should go back up.  She had vaginal dryness without the estrogen that she did not like.  I am concerned that an oral dosage may not be absorbed adequately.  It is fine with me to try to estradiol 0.5mg  oral daily dosing.  It's also fine to stop.  It really is up to her to decide.

## 2019-12-15 NOTE — Telephone Encounter (Signed)
Spoke with patient. F/u from 11/23/19 MyChart visit.  Reduced estradiol patch to 0.0375 mg weekly as discussed. She changes her patches on Tuesdays. Confirmed patch adheres well for the whole week.  As advised by her psychologist, she increased her Wellbutrin to 450 mg daily 8-9 days ago.   Patient states she has been in the same pattern emotionally every week, is unsure if the patch is effective or lasting long enough.  "Best mood on Wednesday and Thursday. Friday a migraine that kept me in bed all day. Saturday full of energy, pushed through. Sunday was a day of self care, felt great. Monday a sobbing mess". States she discussed with psychologist today, states this is a pattern, f/u with GYN.   Patient is asking if she would benefit from adjusting dose? Changing HRT? Or stopping all together?   Advised I will update Dr. Sabra Heck and f/u with recommendations. Patient agreeable.   Length of call 25 min.   Dr. Sabra Heck -please review and advise.

## 2019-12-16 NOTE — Telephone Encounter (Signed)
Spoke with patient. Advised per Dr. Sabra Heck. She is currently using the weekly patch, would like to try the estradiol 0.0375 mg patch twice weekly.  Advised patient I would update Dr. Sabra Heck and f/u with RX recommendations. Pharmacy confirmed.   Dr. Sabra Heck -please advise on Rx.

## 2019-12-16 NOTE — Telephone Encounter (Signed)
That is dosage she is currently using and has a prescription for this already.

## 2019-12-16 NOTE — Telephone Encounter (Signed)
Patient is currently using once a week. Last Rx dispensed 11/23/19 for estradiol (Cl[place]ara) 0.0375mg  patch once a week. #4/5RF  She has used her last patch. Is due to have Rx refilled.   Routing to Dr. Sabra Heck

## 2019-12-17 DIAGNOSIS — K5900 Constipation, unspecified: Secondary | ICD-10-CM | POA: Diagnosis not present

## 2019-12-17 DIAGNOSIS — M6281 Muscle weakness (generalized): Secondary | ICD-10-CM | POA: Diagnosis not present

## 2019-12-17 DIAGNOSIS — R102 Pelvic and perineal pain: Secondary | ICD-10-CM | POA: Diagnosis not present

## 2019-12-17 DIAGNOSIS — L910 Hypertrophic scar: Secondary | ICD-10-CM | POA: Diagnosis not present

## 2019-12-17 MED ORDER — ESTRADIOL 0.0375 MG/24HR TD PTTW: 1.0000 | MEDICATED_PATCH | TRANSDERMAL | 5 refills | Status: DC

## 2019-12-17 NOTE — Telephone Encounter (Signed)
It's fine to send new rx for Vivelle dot 0.0375mg  patches to skin twice weekly.  #8/5RF.

## 2019-12-17 NOTE — Telephone Encounter (Signed)
Rx sent to pharmacy. Patient notified.   Encounter closed.

## 2019-12-22 DIAGNOSIS — F330 Major depressive disorder, recurrent, mild: Secondary | ICD-10-CM | POA: Diagnosis not present

## 2019-12-29 DIAGNOSIS — F330 Major depressive disorder, recurrent, mild: Secondary | ICD-10-CM | POA: Diagnosis not present

## 2019-12-31 DIAGNOSIS — K5900 Constipation, unspecified: Secondary | ICD-10-CM | POA: Diagnosis not present

## 2019-12-31 DIAGNOSIS — M62838 Other muscle spasm: Secondary | ICD-10-CM | POA: Diagnosis not present

## 2019-12-31 DIAGNOSIS — R102 Pelvic and perineal pain: Secondary | ICD-10-CM | POA: Diagnosis not present

## 2019-12-31 DIAGNOSIS — M6281 Muscle weakness (generalized): Secondary | ICD-10-CM | POA: Diagnosis not present

## 2020-01-05 DIAGNOSIS — F330 Major depressive disorder, recurrent, mild: Secondary | ICD-10-CM | POA: Diagnosis not present

## 2020-01-11 DIAGNOSIS — E119 Type 2 diabetes mellitus without complications: Secondary | ICD-10-CM | POA: Diagnosis not present

## 2020-01-11 DIAGNOSIS — K639 Disease of intestine, unspecified: Secondary | ICD-10-CM | POA: Diagnosis not present

## 2020-01-11 DIAGNOSIS — E039 Hypothyroidism, unspecified: Secondary | ICD-10-CM | POA: Diagnosis not present

## 2020-01-11 DIAGNOSIS — F32A Depression, unspecified: Secondary | ICD-10-CM | POA: Diagnosis not present

## 2020-01-11 DIAGNOSIS — E785 Hyperlipidemia, unspecified: Secondary | ICD-10-CM | POA: Diagnosis not present

## 2020-01-11 DIAGNOSIS — E538 Deficiency of other specified B group vitamins: Secondary | ICD-10-CM | POA: Diagnosis not present

## 2020-01-12 DIAGNOSIS — F330 Major depressive disorder, recurrent, mild: Secondary | ICD-10-CM | POA: Diagnosis not present

## 2020-01-18 DIAGNOSIS — K639 Disease of intestine, unspecified: Secondary | ICD-10-CM | POA: Diagnosis not present

## 2020-01-18 DIAGNOSIS — E785 Hyperlipidemia, unspecified: Secondary | ICD-10-CM | POA: Diagnosis not present

## 2020-01-18 DIAGNOSIS — G4700 Insomnia, unspecified: Secondary | ICD-10-CM | POA: Diagnosis not present

## 2020-01-18 DIAGNOSIS — E538 Deficiency of other specified B group vitamins: Secondary | ICD-10-CM | POA: Diagnosis not present

## 2020-01-19 DIAGNOSIS — F330 Major depressive disorder, recurrent, mild: Secondary | ICD-10-CM | POA: Diagnosis not present

## 2020-01-26 DIAGNOSIS — F330 Major depressive disorder, recurrent, mild: Secondary | ICD-10-CM | POA: Diagnosis not present

## 2020-02-01 DIAGNOSIS — E039 Hypothyroidism, unspecified: Secondary | ICD-10-CM | POA: Diagnosis not present

## 2020-02-01 DIAGNOSIS — G4700 Insomnia, unspecified: Secondary | ICD-10-CM | POA: Diagnosis not present

## 2020-02-01 DIAGNOSIS — E119 Type 2 diabetes mellitus without complications: Secondary | ICD-10-CM | POA: Diagnosis not present

## 2020-02-01 DIAGNOSIS — E785 Hyperlipidemia, unspecified: Secondary | ICD-10-CM | POA: Diagnosis not present

## 2020-02-03 DIAGNOSIS — R102 Pelvic and perineal pain: Secondary | ICD-10-CM | POA: Diagnosis not present

## 2020-02-03 DIAGNOSIS — L910 Hypertrophic scar: Secondary | ICD-10-CM | POA: Diagnosis not present

## 2020-02-03 DIAGNOSIS — E538 Deficiency of other specified B group vitamins: Secondary | ICD-10-CM | POA: Diagnosis not present

## 2020-02-03 DIAGNOSIS — M6289 Other specified disorders of muscle: Secondary | ICD-10-CM | POA: Diagnosis not present

## 2020-02-03 DIAGNOSIS — E785 Hyperlipidemia, unspecified: Secondary | ICD-10-CM | POA: Diagnosis not present

## 2020-02-03 DIAGNOSIS — K5900 Constipation, unspecified: Secondary | ICD-10-CM | POA: Diagnosis not present

## 2020-02-03 DIAGNOSIS — G4700 Insomnia, unspecified: Secondary | ICD-10-CM | POA: Diagnosis not present

## 2020-02-03 DIAGNOSIS — K639 Disease of intestine, unspecified: Secondary | ICD-10-CM | POA: Diagnosis not present

## 2020-02-04 DIAGNOSIS — F330 Major depressive disorder, recurrent, mild: Secondary | ICD-10-CM | POA: Diagnosis not present

## 2020-02-08 ENCOUNTER — Ambulatory Visit (INDEPENDENT_AMBULATORY_CARE_PROVIDER_SITE_OTHER): Payer: BC Managed Care – PPO | Admitting: Obstetrics & Gynecology

## 2020-02-08 ENCOUNTER — Other Ambulatory Visit: Payer: Self-pay

## 2020-02-08 ENCOUNTER — Encounter: Payer: Self-pay | Admitting: Obstetrics & Gynecology

## 2020-02-08 VITALS — BP 102/70 | HR 92 | Wt 131.0 lb

## 2020-02-08 DIAGNOSIS — N951 Menopausal and female climacteric states: Secondary | ICD-10-CM | POA: Diagnosis not present

## 2020-02-08 DIAGNOSIS — R5383 Other fatigue: Secondary | ICD-10-CM

## 2020-02-08 DIAGNOSIS — E119 Type 2 diabetes mellitus without complications: Secondary | ICD-10-CM

## 2020-02-08 DIAGNOSIS — Z0189 Encounter for other specified special examinations: Secondary | ICD-10-CM | POA: Diagnosis not present

## 2020-02-08 DIAGNOSIS — K909 Intestinal malabsorption, unspecified: Secondary | ICD-10-CM | POA: Diagnosis not present

## 2020-02-08 MED ORDER — FLUCONAZOLE 150 MG PO TABS: 150.0000 mg | ORAL_TABLET | Freq: Once | ORAL | 1 refills | Status: AC

## 2020-02-08 NOTE — Progress Notes (Signed)
hasn't seen PCP in about 2 years due to covid so she would like some wellness lab blood work  And discuss hormone therapy and has switched ant depressant meds at the end of october
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
## 2020-02-09 DIAGNOSIS — F330 Major depressive disorder, recurrent, mild: Secondary | ICD-10-CM | POA: Diagnosis not present

## 2020-02-09 LAB — CBC
Hematocrit: 44.2 % (ref 34.0–46.6)
Hemoglobin: 14.8 g/dL (ref 11.1–15.9)
MCH: 30.5 pg (ref 26.6–33.0)
MCHC: 33.5 g/dL (ref 31.5–35.7)
MCV: 91 fL (ref 79–97)
Platelets: 213 10*3/uL (ref 150–450)
RBC: 4.85 x10E6/uL (ref 3.77–5.28)
RDW: 11.9 % (ref 11.7–15.4)
WBC: 4.7 10*3/uL (ref 3.4–10.8)

## 2020-02-09 LAB — LIPID PANEL
Chol/HDL Ratio: 2.3 ratio (ref 0.0–4.4)
Cholesterol, Total: 161 mg/dL (ref 100–199)
HDL: 71 mg/dL (ref >39)
LDL Chol Calc (NIH): 81 mg/dL (ref 0–99)
Triglycerides: 37 mg/dL (ref 0–149)
VLDL Cholesterol Cal: 9 mg/dL (ref 5–40)

## 2020-02-09 LAB — COMPREHENSIVE METABOLIC PANEL
ALT: 18 IU/L (ref 0–32)
AST: 19 IU/L (ref 0–40)
Albumin/Globulin Ratio: 2.3 — ABNORMAL HIGH (ref 1.2–2.2)
Albumin: 4.6 g/dL (ref 3.8–4.9)
Alkaline Phosphatase: 85 IU/L (ref 44–121)
BUN/Creatinine Ratio: 21 (ref 9–23)
BUN: 13 mg/dL (ref 6–24)
Bilirubin Total: 0.3 mg/dL (ref 0.0–1.2)
CO2: 26 mmol/L (ref 20–29)
Calcium: 9.3 mg/dL (ref 8.7–10.2)
Chloride: 98 mmol/L (ref 96–106)
Creatinine, Ser: 0.61 mg/dL (ref 0.57–1.00)
GFR calc Af Amer: 121 mL/min/{1.73_m2} (ref >59)
GFR calc non Af Amer: 105 mL/min/{1.73_m2} (ref >59)
Globulin, Total: 2 g/dL (ref 1.5–4.5)
Glucose: 135 mg/dL — ABNORMAL HIGH (ref 65–99)
Potassium: 4.5 mmol/L (ref 3.5–5.2)
Sodium: 137 mmol/L (ref 134–144)
Total Protein: 6.6 g/dL (ref 6.0–8.5)

## 2020-02-09 LAB — IRON: Iron: 64 ug/dL (ref 27–159)

## 2020-02-09 LAB — FERRITIN: Ferritin: 186 ng/mL — ABNORMAL HIGH (ref 15–150)

## 2020-02-09 LAB — TSH: TSH: 1.76 u[IU]/mL (ref 0.450–4.500)

## 2020-02-09 LAB — C-REACTIVE PROTEIN: CRP: 3 mg/L (ref 0–10)

## 2020-02-09 LAB — VITAMIN B12: Vitamin B-12: 489 pg/mL (ref 232–1245)

## 2020-02-09 LAB — HEMOGLOBIN A1C
Est. average glucose Bld gHb Est-mCnc: 148 mg/dL
Hgb A1c MFr Bld: 6.8 % — ABNORMAL HIGH (ref 4.8–5.6)

## 2020-02-09 LAB — ESTRADIOL: Estradiol: 8.2 pg/mL

## 2020-02-09 LAB — VITAMIN D 25 HYDROXY (VIT D DEFICIENCY, FRACTURES): Vit D, 25-Hydroxy: 12.9 ng/mL — ABNORMAL LOW (ref 30.0–100.0)

## 2020-02-09 LAB — SEDIMENTATION RATE: Sed Rate: 2 mm/hr (ref 0–40)

## 2020-02-15 ENCOUNTER — Other Ambulatory Visit: Payer: Self-pay | Admitting: Obstetrics & Gynecology

## 2020-02-15 DIAGNOSIS — Z794 Long term (current) use of insulin: Secondary | ICD-10-CM

## 2020-02-15 DIAGNOSIS — E119 Type 2 diabetes mellitus without complications: Secondary | ICD-10-CM

## 2020-02-15 MED ORDER — VITAMIN D (ERGOCALCIFEROL) 1.25 MG (50000 UNIT) PO CAPS: 50000.0000 [IU] | ORAL_CAPSULE | ORAL | 0 refills | Status: DC

## 2020-02-15 NOTE — Progress Notes (Signed)
Vit d rx sent to pharmacy.

## 2020-02-15 NOTE — Telephone Encounter (Signed)
Heather, Could you do this referral for Denise Kerr.  It's for a [facility] provider, endocrinology.  Pt is on long term insulin.  I placed the external referral in Epic.  Thank you.  Edwinna Areola

## 2020-02-16 DIAGNOSIS — F330 Major depressive disorder, recurrent, mild: Secondary | ICD-10-CM | POA: Diagnosis not present

## 2020-02-17 DIAGNOSIS — G4700 Insomnia, unspecified: Secondary | ICD-10-CM | POA: Diagnosis not present

## 2020-02-17 DIAGNOSIS — E039 Hypothyroidism, unspecified: Secondary | ICD-10-CM | POA: Diagnosis not present

## 2020-02-17 DIAGNOSIS — E785 Hyperlipidemia, unspecified: Secondary | ICD-10-CM | POA: Diagnosis not present

## 2020-02-17 DIAGNOSIS — E119 Type 2 diabetes mellitus without complications: Secondary | ICD-10-CM | POA: Diagnosis not present

## 2020-02-23 DIAGNOSIS — F330 Major depressive disorder, recurrent, mild: Secondary | ICD-10-CM | POA: Diagnosis not present

## 2020-03-01 DIAGNOSIS — F330 Major depressive disorder, recurrent, mild: Secondary | ICD-10-CM | POA: Diagnosis not present

## 2020-03-08 DIAGNOSIS — F330 Major depressive disorder, recurrent, mild: Secondary | ICD-10-CM | POA: Diagnosis not present

## 2020-03-09 DIAGNOSIS — F3341 Major depressive disorder, recurrent, in partial remission: Secondary | ICD-10-CM | POA: Diagnosis not present

## 2020-03-15 DIAGNOSIS — F330 Major depressive disorder, recurrent, mild: Secondary | ICD-10-CM | POA: Diagnosis not present

## 2020-03-28 DIAGNOSIS — F330 Major depressive disorder, recurrent, mild: Secondary | ICD-10-CM | POA: Diagnosis not present

## 2020-03-31 DIAGNOSIS — L910 Hypertrophic scar: Secondary | ICD-10-CM | POA: Diagnosis not present

## 2020-03-31 DIAGNOSIS — K5900 Constipation, unspecified: Secondary | ICD-10-CM | POA: Diagnosis not present

## 2020-03-31 DIAGNOSIS — R102 Pelvic and perineal pain: Secondary | ICD-10-CM | POA: Diagnosis not present

## 2020-03-31 DIAGNOSIS — M6281 Muscle weakness (generalized): Secondary | ICD-10-CM | POA: Diagnosis not present

## 2020-04-05 DIAGNOSIS — F330 Major depressive disorder, recurrent, mild: Secondary | ICD-10-CM | POA: Diagnosis not present

## 2020-04-12 DIAGNOSIS — F330 Major depressive disorder, recurrent, mild: Secondary | ICD-10-CM | POA: Diagnosis not present

## 2020-04-14 DIAGNOSIS — R102 Pelvic and perineal pain: Secondary | ICD-10-CM | POA: Diagnosis not present

## 2020-04-14 DIAGNOSIS — K5900 Constipation, unspecified: Secondary | ICD-10-CM | POA: Diagnosis not present

## 2020-04-14 DIAGNOSIS — L910 Hypertrophic scar: Secondary | ICD-10-CM | POA: Diagnosis not present

## 2020-04-14 DIAGNOSIS — M6289 Other specified disorders of muscle: Secondary | ICD-10-CM | POA: Diagnosis not present

## 2020-04-19 DIAGNOSIS — F330 Major depressive disorder, recurrent, mild: Secondary | ICD-10-CM | POA: Diagnosis not present

## 2020-04-26 DIAGNOSIS — F330 Major depressive disorder, recurrent, mild: Secondary | ICD-10-CM | POA: Diagnosis not present

## 2020-04-27 DIAGNOSIS — L659 Nonscarring hair loss, unspecified: Secondary | ICD-10-CM | POA: Diagnosis not present

## 2020-04-27 DIAGNOSIS — E118 Type 2 diabetes mellitus with unspecified complications: Secondary | ICD-10-CM | POA: Diagnosis not present

## 2020-04-27 DIAGNOSIS — E039 Hypothyroidism, unspecified: Secondary | ICD-10-CM | POA: Diagnosis not present

## 2020-04-27 DIAGNOSIS — Z8659 Personal history of other mental and behavioral disorders: Secondary | ICD-10-CM | POA: Diagnosis not present

## 2020-04-27 DIAGNOSIS — R5382 Chronic fatigue, unspecified: Secondary | ICD-10-CM | POA: Diagnosis not present

## 2020-04-27 DIAGNOSIS — Z87898 Personal history of other specified conditions: Secondary | ICD-10-CM | POA: Diagnosis not present

## 2020-04-28 DIAGNOSIS — M6281 Muscle weakness (generalized): Secondary | ICD-10-CM | POA: Diagnosis not present

## 2020-04-28 DIAGNOSIS — L910 Hypertrophic scar: Secondary | ICD-10-CM | POA: Diagnosis not present

## 2020-04-28 DIAGNOSIS — K5900 Constipation, unspecified: Secondary | ICD-10-CM | POA: Diagnosis not present

## 2020-04-28 DIAGNOSIS — R102 Pelvic and perineal pain: Secondary | ICD-10-CM | POA: Diagnosis not present

## 2020-05-03 DIAGNOSIS — F330 Major depressive disorder, recurrent, mild: Secondary | ICD-10-CM | POA: Diagnosis not present

## 2020-05-10 DIAGNOSIS — F330 Major depressive disorder, recurrent, mild: Secondary | ICD-10-CM | POA: Diagnosis not present

## 2020-05-17 DIAGNOSIS — F330 Major depressive disorder, recurrent, mild: Secondary | ICD-10-CM | POA: Diagnosis not present

## 2020-05-24 DIAGNOSIS — F330 Major depressive disorder, recurrent, mild: Secondary | ICD-10-CM | POA: Diagnosis not present

## 2020-05-25 ENCOUNTER — Encounter (HOSPITAL_BASED_OUTPATIENT_CLINIC_OR_DEPARTMENT_OTHER): Payer: Self-pay | Admitting: Obstetrics & Gynecology

## 2020-05-25 ENCOUNTER — Ambulatory Visit (INDEPENDENT_AMBULATORY_CARE_PROVIDER_SITE_OTHER): Payer: BC Managed Care – PPO | Admitting: Obstetrics & Gynecology

## 2020-05-25 ENCOUNTER — Other Ambulatory Visit: Payer: Self-pay

## 2020-05-25 ENCOUNTER — Other Ambulatory Visit (HOSPITAL_BASED_OUTPATIENT_CLINIC_OR_DEPARTMENT_OTHER)
Admission: RE | Admit: 2020-05-25 | Discharge: 2020-05-25 | Disposition: A | Discharge status: Home / Self Care | Payer: BC Managed Care – PPO | Source: Ambulatory Visit | Attending: Obstetrics & Gynecology | Admitting: Obstetrics & Gynecology

## 2020-05-25 VITALS — BP 121/79 | HR 93 | Ht 63.5 in | Wt 129.0 lb

## 2020-05-25 DIAGNOSIS — F329 Major depressive disorder, single episode, unspecified: Secondary | ICD-10-CM | POA: Diagnosis not present

## 2020-05-25 DIAGNOSIS — K909 Intestinal malabsorption, unspecified: Secondary | ICD-10-CM

## 2020-05-25 DIAGNOSIS — E559 Vitamin D deficiency, unspecified: Secondary | ICD-10-CM | POA: Insufficient documentation

## 2020-05-25 MED ORDER — ESTRADIOL 0.1 MG/GM VA CREA: TOPICAL_CREAM | VAGINAL | 1 refills | Status: DC
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
## 2020-05-26 LAB — VITAMIN D 25 HYDROXY (VIT D DEFICIENCY, FRACTURES): Vit D, 25-Hydroxy: 42.78 ng/mL (ref 30–100)

## 2020-05-27 ENCOUNTER — Other Ambulatory Visit (HOSPITAL_BASED_OUTPATIENT_CLINIC_OR_DEPARTMENT_OTHER): Payer: Self-pay

## 2020-05-27 MED ORDER — VITAMIN D (ERGOCALCIFEROL) 1.25 MG (50000 UNIT) PO CAPS: ORAL_CAPSULE | ORAL | 0 refills | Status: DC

## 2020-05-30 DIAGNOSIS — E039 Hypothyroidism, unspecified: Secondary | ICD-10-CM | POA: Diagnosis not present

## 2020-05-31 DIAGNOSIS — F330 Major depressive disorder, recurrent, mild: Secondary | ICD-10-CM | POA: Diagnosis not present

## 2020-06-07 DIAGNOSIS — F330 Major depressive disorder, recurrent, mild: Secondary | ICD-10-CM | POA: Diagnosis not present

## 2020-06-09 DIAGNOSIS — R102 Pelvic and perineal pain: Secondary | ICD-10-CM | POA: Diagnosis not present

## 2020-06-09 DIAGNOSIS — M6281 Muscle weakness (generalized): Secondary | ICD-10-CM | POA: Diagnosis not present

## 2020-06-09 DIAGNOSIS — K5900 Constipation, unspecified: Secondary | ICD-10-CM | POA: Diagnosis not present

## 2020-06-09 DIAGNOSIS — L910 Hypertrophic scar: Secondary | ICD-10-CM | POA: Diagnosis not present

## 2020-06-11 ENCOUNTER — Other Ambulatory Visit: Payer: Self-pay | Admitting: Hematology

## 2020-06-14 DIAGNOSIS — F330 Major depressive disorder, recurrent, mild: Secondary | ICD-10-CM | POA: Diagnosis not present

## 2020-06-16 ENCOUNTER — Other Ambulatory Visit: Payer: Self-pay | Admitting: Internal Medicine

## 2020-06-16 DIAGNOSIS — E039 Hypothyroidism, unspecified: Secondary | ICD-10-CM

## 2020-06-17 ENCOUNTER — Ambulatory Visit
Admission: RE | Admit: 2020-06-17 | Discharge: 2020-06-17 | Disposition: A | Discharge status: Home / Self Care | Payer: BC Managed Care – PPO | Source: Ambulatory Visit | Attending: Internal Medicine | Admitting: Internal Medicine

## 2020-06-17 DIAGNOSIS — E041 Nontoxic single thyroid nodule: Secondary | ICD-10-CM | POA: Diagnosis not present

## 2020-06-17 DIAGNOSIS — E039 Hypothyroidism, unspecified: Secondary | ICD-10-CM

## 2020-06-18 ENCOUNTER — Encounter (HOSPITAL_BASED_OUTPATIENT_CLINIC_OR_DEPARTMENT_OTHER): Payer: Self-pay

## 2020-06-21 DIAGNOSIS — F330 Major depressive disorder, recurrent, mild: Secondary | ICD-10-CM | POA: Diagnosis not present

## 2020-06-28 DIAGNOSIS — R5382 Chronic fatigue, unspecified: Secondary | ICD-10-CM | POA: Diagnosis not present

## 2020-06-28 DIAGNOSIS — L659 Nonscarring hair loss, unspecified: Secondary | ICD-10-CM | POA: Diagnosis not present

## 2020-06-28 DIAGNOSIS — Z87898 Personal history of other specified conditions: Secondary | ICD-10-CM | POA: Diagnosis not present

## 2020-06-28 DIAGNOSIS — F330 Major depressive disorder, recurrent, mild: Secondary | ICD-10-CM | POA: Diagnosis not present
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
## 2020-07-01 ENCOUNTER — Inpatient Hospital Stay: Payer: BC Managed Care – PPO

## 2020-07-01 ENCOUNTER — Inpatient Hospital Stay: Payer: BC Managed Care – PPO | Admitting: Hematology

## 2020-07-05 DIAGNOSIS — F330 Major depressive disorder, recurrent, mild: Secondary | ICD-10-CM | POA: Diagnosis not present

## 2020-07-06 DIAGNOSIS — E042 Nontoxic multinodular goiter: Secondary | ICD-10-CM | POA: Diagnosis not present

## 2020-07-08 ENCOUNTER — Telehealth (HOSPITAL_BASED_OUTPATIENT_CLINIC_OR_DEPARTMENT_OTHER): Payer: Self-pay

## 2020-07-08 DIAGNOSIS — F331 Major depressive disorder, recurrent, moderate: Secondary | ICD-10-CM | POA: Diagnosis not present

## 2020-07-08 NOTE — Telephone Encounter (Signed)
Denise Kerr is a 50 y.o. female called in and said she is having sx of her vitamin D being low.  Pt said she had labs done at through [facility] and need to be placed back on vitamin medication.  Pt said she had the labs faxed over to [place] but we did not receive them.

## 2020-07-11 ENCOUNTER — Encounter (HOSPITAL_BASED_OUTPATIENT_CLINIC_OR_DEPARTMENT_OTHER): Payer: Self-pay

## 2020-07-11 ENCOUNTER — Other Ambulatory Visit (HOSPITAL_BASED_OUTPATIENT_CLINIC_OR_DEPARTMENT_OTHER): Payer: Self-pay | Admitting: Obstetrics & Gynecology

## 2020-07-11 DIAGNOSIS — E559 Vitamin D deficiency, unspecified: Secondary | ICD-10-CM

## 2020-07-11 MED ORDER — VITAMIN D (ERGOCALCIFEROL) 1.25 MG (50000 UNIT) PO CAPS: 50000.0000 [IU] | ORAL_CAPSULE | ORAL | 0 refills | Status: DC

## 2020-07-11 NOTE — Telephone Encounter (Signed)
Please advise 

## 2020-07-11 NOTE — Telephone Encounter (Signed)
Called pt personally.  She does not feel as good since she went on the Vit D monthly from the weekly dosage.  She has been continuing to see her psychiatrist weekly.  Psychiatrist thinks this may be part of the issue.  Pt would like to try weekly dosage.  We discussed what defines levels that are too high and possible symptoms with this.  Will plan to restart weekly dosage and then recheck Vit D level in about 8 weeks.  Pt did confirm she did not have another Vit D level done at [facility].  The last one was the end of March, 2022, with me.  Lab order placed and appt scheduled.

## 2020-07-11 NOTE — Telephone Encounter (Signed)
Denise Kerr is a 50 y.o. female called in requesting I send a message to Dr. Sabra Heck re: the vitamin d she is on is not as effective as it was before.  Pt said she has mood changes and her quality of life is be affected. Pt said her she would like to speak to Dr. Sabra Heck about what she should do. Pt was encouraged to send a message via my chart. I told her I would send a message also.

## 2020-07-12 DIAGNOSIS — F330 Major depressive disorder, recurrent, mild: Secondary | ICD-10-CM | POA: Diagnosis not present

## 2020-07-19 DIAGNOSIS — F330 Major depressive disorder, recurrent, mild: Secondary | ICD-10-CM | POA: Diagnosis not present
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
## 2020-07-22 ENCOUNTER — Other Ambulatory Visit: Payer: Self-pay

## 2020-07-22 ENCOUNTER — Inpatient Hospital Stay: Payer: BC Managed Care – PPO | Attending: Hematology | Admitting: Hematology

## 2020-07-22 ENCOUNTER — Inpatient Hospital Stay: Payer: BC Managed Care – PPO

## 2020-07-22 VITALS — BP 92/66 | HR 90 | Temp 97.8°F | Resp 18 | Ht 63.5 in | Wt 128.2 lb

## 2020-07-22 DIAGNOSIS — D500 Iron deficiency anemia secondary to blood loss (chronic): Secondary | ICD-10-CM

## 2020-07-22 DIAGNOSIS — E538 Deficiency of other specified B group vitamins: Secondary | ICD-10-CM

## 2020-07-22 DIAGNOSIS — E611 Iron deficiency: Secondary | ICD-10-CM | POA: Diagnosis not present

## 2020-07-22 LAB — CBC WITH DIFFERENTIAL/PLATELET
Abs Immature Granulocytes: 0.01 10*3/uL (ref 0.00–0.07)
Basophils Absolute: 0 10*3/uL (ref 0.0–0.1)
Basophils Relative: 1 %
Eosinophils Absolute: 0 10*3/uL (ref 0.0–0.5)
Eosinophils Relative: 0 %
HCT: 40.5 % (ref 36.0–46.0)
Hemoglobin: 13.5 g/dL (ref 12.0–15.0)
Immature Granulocytes: 0 %
Lymphocytes Relative: 22 %
Lymphs Abs: 1.1 10*3/uL (ref 0.7–4.0)
MCH: 30.4 pg (ref 26.0–34.0)
MCHC: 33.3 g/dL (ref 30.0–36.0)
MCV: 91.2 fL (ref 80.0–100.0)
Monocytes Absolute: 0.3 10*3/uL (ref 0.1–1.0)
Monocytes Relative: 6 %
Neutro Abs: 3.7 10*3/uL (ref 1.7–7.7)
Neutrophils Relative %: 71 %
Platelets: 185 10*3/uL (ref 150–400)
RBC: 4.44 MIL/uL (ref 3.87–5.11)
RDW: 12.6 % (ref 11.5–15.5)
WBC: 5.2 10*3/uL (ref 4.0–10.5)
nRBC: 0 % (ref 0.0–0.2)

## 2020-07-22 LAB — VITAMIN B12: Vitamin B-12: 371 pg/mL (ref 180–914)

## 2020-07-26 DIAGNOSIS — F330 Major depressive disorder, recurrent, mild: Secondary | ICD-10-CM | POA: Diagnosis not present

## 2020-07-28 ENCOUNTER — Encounter: Payer: Self-pay | Admitting: Hematology

## 2020-08-09 DIAGNOSIS — F330 Major depressive disorder, recurrent, mild: Secondary | ICD-10-CM | POA: Diagnosis not present

## 2020-08-16 DIAGNOSIS — F330 Major depressive disorder, recurrent, mild: Secondary | ICD-10-CM | POA: Diagnosis not present

## 2020-08-17 DIAGNOSIS — F332 Major depressive disorder, recurrent severe without psychotic features: Secondary | ICD-10-CM | POA: Diagnosis not present

## 2020-08-23 DIAGNOSIS — F330 Major depressive disorder, recurrent, mild: Secondary | ICD-10-CM | POA: Diagnosis not present

## 2020-08-30 DIAGNOSIS — F330 Major depressive disorder, recurrent, mild: Secondary | ICD-10-CM | POA: Diagnosis not present

## 2020-08-31 DIAGNOSIS — G4700 Insomnia, unspecified: Secondary | ICD-10-CM | POA: Diagnosis not present

## 2020-08-31 DIAGNOSIS — F3341 Major depressive disorder, recurrent, in partial remission: Secondary | ICD-10-CM | POA: Diagnosis not present

## 2020-09-06 DIAGNOSIS — F330 Major depressive disorder, recurrent, mild: Secondary | ICD-10-CM | POA: Diagnosis not present

## 2020-09-07 DIAGNOSIS — R5382 Chronic fatigue, unspecified: Secondary | ICD-10-CM | POA: Diagnosis not present

## 2020-09-07 DIAGNOSIS — Z8659 Personal history of other mental and behavioral disorders: Secondary | ICD-10-CM | POA: Diagnosis not present

## 2020-09-07 DIAGNOSIS — E119 Type 2 diabetes mellitus without complications: Secondary | ICD-10-CM | POA: Diagnosis not present

## 2020-09-07 DIAGNOSIS — E042 Nontoxic multinodular goiter: Secondary | ICD-10-CM | POA: Diagnosis not present

## 2020-09-07 DIAGNOSIS — E039 Hypothyroidism, unspecified: Secondary | ICD-10-CM | POA: Diagnosis not present

## 2020-09-07 DIAGNOSIS — Z794 Long term (current) use of insulin: Secondary | ICD-10-CM | POA: Diagnosis not present

## 2020-09-12 ENCOUNTER — Other Ambulatory Visit (HOSPITAL_BASED_OUTPATIENT_CLINIC_OR_DEPARTMENT_OTHER): Payer: Self-pay | Admitting: Obstetrics & Gynecology

## 2020-09-12 ENCOUNTER — Telehealth (HOSPITAL_BASED_OUTPATIENT_CLINIC_OR_DEPARTMENT_OTHER): Payer: Self-pay | Admitting: Obstetrics & Gynecology

## 2020-09-12 ENCOUNTER — Other Ambulatory Visit: Payer: Self-pay

## 2020-09-12 ENCOUNTER — Other Ambulatory Visit (HOSPITAL_BASED_OUTPATIENT_CLINIC_OR_DEPARTMENT_OTHER)
Admission: RE | Admit: 2020-09-12 | Discharge: 2020-09-12 | Disposition: A | Discharge status: Home / Self Care | Payer: BC Managed Care – PPO | Source: Ambulatory Visit | Attending: Obstetrics & Gynecology | Admitting: Obstetrics & Gynecology

## 2020-09-12 DIAGNOSIS — Z8639 Personal history of other endocrine, nutritional and metabolic disease: Secondary | ICD-10-CM

## 2020-09-12 DIAGNOSIS — E559 Vitamin D deficiency, unspecified: Secondary | ICD-10-CM

## 2020-09-12 LAB — IRON: Iron: 73 ug/dL (ref 28–170)

## 2020-09-12 NOTE — Telephone Encounter (Signed)
Patient called and would for Dr.Miller to add to labs orders for Iron levels be be done today at 2pm .Patient  already has appointment at the lab at 2pm today.

## 2020-09-20 DIAGNOSIS — F330 Major depressive disorder, recurrent, mild: Secondary | ICD-10-CM | POA: Diagnosis not present

## 2020-09-21 LAB — VITAMIN D 1,25 DIHYDROXY
Vitamin D 1, 25 (OH)2 Total: 77 pg/mL — ABNORMAL HIGH
Vitamin D2 1, 25 (OH)2: 70 pg/mL
Vitamin D3 1, 25 (OH)2: <10 pg/mL

## 2020-09-27 DIAGNOSIS — F330 Major depressive disorder, recurrent, mild: Secondary | ICD-10-CM | POA: Diagnosis not present

## 2020-10-04 DIAGNOSIS — F330 Major depressive disorder, recurrent, mild: Secondary | ICD-10-CM | POA: Diagnosis not present

## 2020-10-11 DIAGNOSIS — F330 Major depressive disorder, recurrent, mild: Secondary | ICD-10-CM | POA: Diagnosis not present

## 2020-10-11 DIAGNOSIS — F332 Major depressive disorder, recurrent severe without psychotic features: Secondary | ICD-10-CM | POA: Diagnosis not present

## 2020-10-13 ENCOUNTER — Encounter (HOSPITAL_BASED_OUTPATIENT_CLINIC_OR_DEPARTMENT_OTHER): Payer: Self-pay

## 2020-10-13 DIAGNOSIS — F332 Major depressive disorder, recurrent severe without psychotic features: Secondary | ICD-10-CM | POA: Diagnosis not present

## 2020-10-17 DIAGNOSIS — F332 Major depressive disorder, recurrent severe without psychotic features: Secondary | ICD-10-CM | POA: Diagnosis not present

## 2020-10-17 DIAGNOSIS — F330 Major depressive disorder, recurrent, mild: Secondary | ICD-10-CM | POA: Diagnosis not present

## 2020-10-19 DIAGNOSIS — F332 Major depressive disorder, recurrent severe without psychotic features: Secondary | ICD-10-CM | POA: Diagnosis not present

## 2020-10-24 DIAGNOSIS — F332 Major depressive disorder, recurrent severe without psychotic features: Secondary | ICD-10-CM | POA: Diagnosis not present

## 2020-10-24 DIAGNOSIS — F330 Major depressive disorder, recurrent, mild: Secondary | ICD-10-CM | POA: Diagnosis not present

## 2020-10-26 DIAGNOSIS — F332 Major depressive disorder, recurrent severe without psychotic features: Secondary | ICD-10-CM | POA: Diagnosis not present

## 2020-11-01 ENCOUNTER — Telehealth (HOSPITAL_BASED_OUTPATIENT_CLINIC_OR_DEPARTMENT_OTHER): Payer: Self-pay

## 2020-11-01 DIAGNOSIS — F332 Major depressive disorder, recurrent severe without psychotic features: Secondary | ICD-10-CM | POA: Diagnosis not present

## 2020-11-01 DIAGNOSIS — F330 Major depressive disorder, recurrent, mild: Secondary | ICD-10-CM | POA: Diagnosis not present

## 2020-11-01 NOTE — Telephone Encounter (Signed)
Tried contacting patient without success concerning a message that was left on the voicemail 9/2. Patient states she has tried contacting the office on several occasions and still has not gotten a resolution to her questions.   Patient left a series of questions on the voicemail that have been addressed on the labs. LMOM at 1:48 for patient to call office back. tbw

## 2020-11-03 NOTE — Telephone Encounter (Signed)
Pt is concerned about keeping a check on her Vitamin D level. She is requesting to have her level drawn every 3 months. She wants to make sure it stays at a good level because it affects how she feels and she also deals with malabsorption issues and is lactose intolerant. Pt is currently taking 50,000IU once monthly. Advised that I would send her request to provider and get back with recommendations. Pt verbalized understanding.

## 2020-11-04 ENCOUNTER — Other Ambulatory Visit (HOSPITAL_BASED_OUTPATIENT_CLINIC_OR_DEPARTMENT_OTHER): Payer: Self-pay | Admitting: Obstetrics & Gynecology

## 2020-11-04 ENCOUNTER — Encounter (HOSPITAL_BASED_OUTPATIENT_CLINIC_OR_DEPARTMENT_OTHER): Payer: Self-pay | Admitting: *Deleted

## 2020-11-04 DIAGNOSIS — E559 Vitamin D deficiency, unspecified: Secondary | ICD-10-CM

## 2020-11-04 DIAGNOSIS — K909 Intestinal malabsorption, unspecified: Secondary | ICD-10-CM

## 2020-11-04 NOTE — Progress Notes (Signed)
Vit D level ordered

## 2020-11-07 DIAGNOSIS — F332 Major depressive disorder, recurrent severe without psychotic features: Secondary | ICD-10-CM | POA: Diagnosis not present

## 2020-11-08 ENCOUNTER — Other Ambulatory Visit: Payer: Self-pay | Admitting: Internal Medicine

## 2020-11-08 DIAGNOSIS — F330 Major depressive disorder, recurrent, mild: Secondary | ICD-10-CM | POA: Diagnosis not present

## 2020-11-08 DIAGNOSIS — E042 Nontoxic multinodular goiter: Secondary | ICD-10-CM

## 2020-11-15 DIAGNOSIS — F330 Major depressive disorder, recurrent, mild: Secondary | ICD-10-CM | POA: Diagnosis not present

## 2020-11-21 DIAGNOSIS — F332 Major depressive disorder, recurrent severe without psychotic features: Secondary | ICD-10-CM | POA: Diagnosis not present

## 2020-11-22 DIAGNOSIS — F330 Major depressive disorder, recurrent, mild: Secondary | ICD-10-CM | POA: Diagnosis not present

## 2020-11-23 DIAGNOSIS — F332 Major depressive disorder, recurrent severe without psychotic features: Secondary | ICD-10-CM | POA: Diagnosis not present

## 2020-11-28 DIAGNOSIS — F332 Major depressive disorder, recurrent severe without psychotic features: Secondary | ICD-10-CM | POA: Diagnosis not present

## 2020-11-29 DIAGNOSIS — F330 Major depressive disorder, recurrent, mild: Secondary | ICD-10-CM | POA: Diagnosis not present

## 2020-11-30 DIAGNOSIS — F332 Major depressive disorder, recurrent severe without psychotic features: Secondary | ICD-10-CM | POA: Diagnosis not present

## 2020-12-05 DIAGNOSIS — F332 Major depressive disorder, recurrent severe without psychotic features: Secondary | ICD-10-CM | POA: Diagnosis not present

## 2020-12-06 DIAGNOSIS — F330 Major depressive disorder, recurrent, mild: Secondary | ICD-10-CM | POA: Diagnosis not present

## 2020-12-07 DIAGNOSIS — F332 Major depressive disorder, recurrent severe without psychotic features: Secondary | ICD-10-CM | POA: Diagnosis not present

## 2020-12-12 DIAGNOSIS — F332 Major depressive disorder, recurrent severe without psychotic features: Secondary | ICD-10-CM | POA: Diagnosis not present

## 2020-12-13 DIAGNOSIS — F330 Major depressive disorder, recurrent, mild: Secondary | ICD-10-CM | POA: Diagnosis not present

## 2020-12-14 ENCOUNTER — Other Ambulatory Visit (HOSPITAL_COMMUNITY)
Admission: RE | Admit: 2020-12-14 | Discharge: 2020-12-14 | Disposition: A | Discharge status: Home / Self Care | Payer: BC Managed Care – PPO | Source: Ambulatory Visit | Attending: Internal Medicine | Admitting: Internal Medicine

## 2020-12-14 ENCOUNTER — Ambulatory Visit
Admission: RE | Admit: 2020-12-14 | Discharge: 2020-12-14 | Disposition: A | Discharge status: Home / Self Care | Payer: BC Managed Care – PPO | Source: Ambulatory Visit | Attending: Internal Medicine | Admitting: Internal Medicine

## 2020-12-14 DIAGNOSIS — E041 Nontoxic single thyroid nodule: Secondary | ICD-10-CM | POA: Diagnosis not present

## 2020-12-14 DIAGNOSIS — E042 Nontoxic multinodular goiter: Secondary | ICD-10-CM | POA: Insufficient documentation

## 2020-12-15 DIAGNOSIS — F332 Major depressive disorder, recurrent severe without psychotic features: Secondary | ICD-10-CM | POA: Diagnosis not present

## 2020-12-16 LAB — CYTOLOGY - NON PAP

## 2020-12-19 ENCOUNTER — Encounter (HOSPITAL_BASED_OUTPATIENT_CLINIC_OR_DEPARTMENT_OTHER): Payer: Self-pay

## 2020-12-19 DIAGNOSIS — F332 Major depressive disorder, recurrent severe without psychotic features: Secondary | ICD-10-CM | POA: Diagnosis not present

## 2020-12-21 DIAGNOSIS — F332 Major depressive disorder, recurrent severe without psychotic features: Secondary | ICD-10-CM | POA: Diagnosis not present

## 2020-12-22 ENCOUNTER — Other Ambulatory Visit: Payer: Self-pay

## 2020-12-22 ENCOUNTER — Other Ambulatory Visit (HOSPITAL_BASED_OUTPATIENT_CLINIC_OR_DEPARTMENT_OTHER): Payer: BC Managed Care – PPO

## 2020-12-22 DIAGNOSIS — K909 Intestinal malabsorption, unspecified: Secondary | ICD-10-CM

## 2020-12-22 DIAGNOSIS — E042 Nontoxic multinodular goiter: Secondary | ICD-10-CM | POA: Diagnosis not present

## 2020-12-22 DIAGNOSIS — E559 Vitamin D deficiency, unspecified: Secondary | ICD-10-CM | POA: Diagnosis not present

## 2020-12-23 LAB — VITAMIN B12: Vitamin B-12: 500 pg/mL (ref 232–1245)

## 2020-12-23 LAB — VITAMIN D 25 HYDROXY (VIT D DEFICIENCY, FRACTURES): Vit D, 25-Hydroxy: 24.3 ng/mL — ABNORMAL LOW (ref 30.0–100.0)

## 2020-12-26 DIAGNOSIS — F332 Major depressive disorder, recurrent severe without psychotic features: Secondary | ICD-10-CM | POA: Diagnosis not present

## 2020-12-27 DIAGNOSIS — F330 Major depressive disorder, recurrent, mild: Secondary | ICD-10-CM | POA: Diagnosis not present

## 2020-12-28 DIAGNOSIS — F332 Major depressive disorder, recurrent severe without psychotic features: Secondary | ICD-10-CM | POA: Diagnosis not present

## 2021-01-02 ENCOUNTER — Other Ambulatory Visit (HOSPITAL_BASED_OUTPATIENT_CLINIC_OR_DEPARTMENT_OTHER): Payer: Self-pay | Admitting: Obstetrics & Gynecology

## 2021-01-02 ENCOUNTER — Encounter (HOSPITAL_BASED_OUTPATIENT_CLINIC_OR_DEPARTMENT_OTHER): Payer: Self-pay

## 2021-01-03 ENCOUNTER — Other Ambulatory Visit (HOSPITAL_BASED_OUTPATIENT_CLINIC_OR_DEPARTMENT_OTHER): Payer: Self-pay | Admitting: Obstetrics & Gynecology

## 2021-01-03 DIAGNOSIS — F332 Major depressive disorder, recurrent severe without psychotic features: Secondary | ICD-10-CM | POA: Diagnosis not present

## 2021-01-03 DIAGNOSIS — F330 Major depressive disorder, recurrent, mild: Secondary | ICD-10-CM | POA: Diagnosis not present

## 2021-01-03 DIAGNOSIS — E559 Vitamin D deficiency, unspecified: Secondary | ICD-10-CM

## 2021-01-03 MED ORDER — VITAMIN D (ERGOCALCIFEROL) 1.25 MG (50000 UNIT) PO CAPS: 50000.0000 [IU] | ORAL_CAPSULE | ORAL | 3 refills | Status: DC

## 2021-01-04 DIAGNOSIS — E119 Type 2 diabetes mellitus without complications: Secondary | ICD-10-CM | POA: Diagnosis not present

## 2021-01-04 DIAGNOSIS — Z8659 Personal history of other mental and behavioral disorders: Secondary | ICD-10-CM | POA: Diagnosis not present

## 2021-01-04 DIAGNOSIS — E042 Nontoxic multinodular goiter: Secondary | ICD-10-CM | POA: Diagnosis not present

## 2021-01-04 DIAGNOSIS — E039 Hypothyroidism, unspecified: Secondary | ICD-10-CM | POA: Diagnosis not present

## 2021-01-04 DIAGNOSIS — Z7984 Long term (current) use of oral hypoglycemic drugs: Secondary | ICD-10-CM | POA: Diagnosis not present

## 2021-01-05 DIAGNOSIS — F332 Major depressive disorder, recurrent severe without psychotic features: Secondary | ICD-10-CM | POA: Diagnosis not present

## 2021-01-09 DIAGNOSIS — F332 Major depressive disorder, recurrent severe without psychotic features: Secondary | ICD-10-CM | POA: Diagnosis not present

## 2021-01-10 DIAGNOSIS — F330 Major depressive disorder, recurrent, mild: Secondary | ICD-10-CM | POA: Diagnosis not present

## 2021-01-11 ENCOUNTER — Encounter (HOSPITAL_COMMUNITY): Payer: Self-pay

## 2021-01-11 DIAGNOSIS — F332 Major depressive disorder, recurrent severe without psychotic features: Secondary | ICD-10-CM | POA: Diagnosis not present

## 2021-01-16 DIAGNOSIS — F332 Major depressive disorder, recurrent severe without psychotic features: Secondary | ICD-10-CM | POA: Diagnosis not present

## 2021-01-16 DIAGNOSIS — F330 Major depressive disorder, recurrent, mild: Secondary | ICD-10-CM | POA: Diagnosis not present

## 2021-01-24 DIAGNOSIS — F330 Major depressive disorder, recurrent, mild: Secondary | ICD-10-CM | POA: Diagnosis not present

## 2021-01-25 DIAGNOSIS — F332 Major depressive disorder, recurrent severe without psychotic features: Secondary | ICD-10-CM | POA: Diagnosis not present

## 2021-01-31 ENCOUNTER — Other Ambulatory Visit: Payer: Self-pay | Admitting: Internal Medicine

## 2021-01-31 DIAGNOSIS — Z1231 Encounter for screening mammogram for malignant neoplasm of breast: Secondary | ICD-10-CM

## 2021-01-31 DIAGNOSIS — F330 Major depressive disorder, recurrent, mild: Secondary | ICD-10-CM | POA: Diagnosis not present

## 2021-02-01 DIAGNOSIS — F332 Major depressive disorder, recurrent severe without psychotic features: Secondary | ICD-10-CM | POA: Diagnosis not present

## 2021-02-06 DIAGNOSIS — F332 Major depressive disorder, recurrent severe without psychotic features: Secondary | ICD-10-CM | POA: Diagnosis not present

## 2021-02-07 DIAGNOSIS — F330 Major depressive disorder, recurrent, mild: Secondary | ICD-10-CM | POA: Diagnosis not present

## 2021-02-08 DIAGNOSIS — F332 Major depressive disorder, recurrent severe without psychotic features: Secondary | ICD-10-CM | POA: Diagnosis not present

## 2021-02-14 DIAGNOSIS — F330 Major depressive disorder, recurrent, mild: Secondary | ICD-10-CM | POA: Diagnosis not present

## 2021-02-28 DIAGNOSIS — F330 Major depressive disorder, recurrent, mild: Secondary | ICD-10-CM | POA: Diagnosis not present

## 2021-03-03 DIAGNOSIS — F332 Major depressive disorder, recurrent severe without psychotic features: Secondary | ICD-10-CM | POA: Diagnosis not present

## 2021-03-06 DIAGNOSIS — F3342 Major depressive disorder, recurrent, in full remission: Secondary | ICD-10-CM | POA: Diagnosis not present

## 2021-03-07 DIAGNOSIS — F332 Major depressive disorder, recurrent severe without psychotic features: Secondary | ICD-10-CM | POA: Diagnosis not present

## 2021-03-07 DIAGNOSIS — F330 Major depressive disorder, recurrent, mild: Secondary | ICD-10-CM | POA: Diagnosis not present

## 2021-03-08 ENCOUNTER — Ambulatory Visit: Payer: BC Managed Care – PPO

## 2021-03-14 DIAGNOSIS — F330 Major depressive disorder, recurrent, mild: Secondary | ICD-10-CM | POA: Diagnosis not present

## 2021-03-15 DIAGNOSIS — F332 Major depressive disorder, recurrent severe without psychotic features: Secondary | ICD-10-CM | POA: Diagnosis not present

## 2021-03-17 DIAGNOSIS — F332 Major depressive disorder, recurrent severe without psychotic features: Secondary | ICD-10-CM | POA: Diagnosis not present

## 2021-03-20 DIAGNOSIS — E119 Type 2 diabetes mellitus without complications: Secondary | ICD-10-CM | POA: Diagnosis not present

## 2021-03-21 DIAGNOSIS — F330 Major depressive disorder, recurrent, mild: Secondary | ICD-10-CM | POA: Diagnosis not present

## 2021-03-23 DIAGNOSIS — F332 Major depressive disorder, recurrent severe without psychotic features: Secondary | ICD-10-CM | POA: Diagnosis not present

## 2021-03-27 DIAGNOSIS — F332 Major depressive disorder, recurrent severe without psychotic features: Secondary | ICD-10-CM | POA: Diagnosis not present

## 2021-03-28 DIAGNOSIS — F330 Major depressive disorder, recurrent, mild: Secondary | ICD-10-CM | POA: Diagnosis not present

## 2021-03-29 DIAGNOSIS — F330 Major depressive disorder, recurrent, mild: Secondary | ICD-10-CM | POA: Diagnosis not present

## 2021-03-29 DIAGNOSIS — F339 Major depressive disorder, recurrent, unspecified: Secondary | ICD-10-CM | POA: Diagnosis not present

## 2021-05-10 ENCOUNTER — Encounter (HOSPITAL_BASED_OUTPATIENT_CLINIC_OR_DEPARTMENT_OTHER): Payer: Self-pay

## 2021-05-10 ENCOUNTER — Ambulatory Visit (HOSPITAL_BASED_OUTPATIENT_CLINIC_OR_DEPARTMENT_OTHER): Payer: BC Managed Care – PPO | Admitting: Obstetrics & Gynecology

## 2021-05-25 ENCOUNTER — Encounter (HOSPITAL_BASED_OUTPATIENT_CLINIC_OR_DEPARTMENT_OTHER): Payer: Self-pay | Admitting: Obstetrics & Gynecology

## 2021-05-25 ENCOUNTER — Encounter: Payer: Self-pay | Admitting: Hematology

## 2021-05-25 ENCOUNTER — Ambulatory Visit (HOSPITAL_BASED_OUTPATIENT_CLINIC_OR_DEPARTMENT_OTHER): Payer: Medicare Other | Admitting: Obstetrics & Gynecology

## 2021-05-25 VITALS — BP 106/85 | HR 119 | Ht 63.5 in | Wt 124.2 lb

## 2021-05-25 DIAGNOSIS — R6882 Decreased libido: Secondary | ICD-10-CM
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
## 2021-05-27 IMAGING — MG DIGITAL SCREENING BILATERAL MAMMOGRAM WITH TOMO AND CAD
8 series · 9 of 24 positions shown · non-contrast
Comparison: Previous exam(s).

CLINICAL DATA: Screening.

EXAM:
DIGITAL SCREENING BILATERAL MAMMOGRAM WITH TOMO AND CAD

[R CC synth-2D]
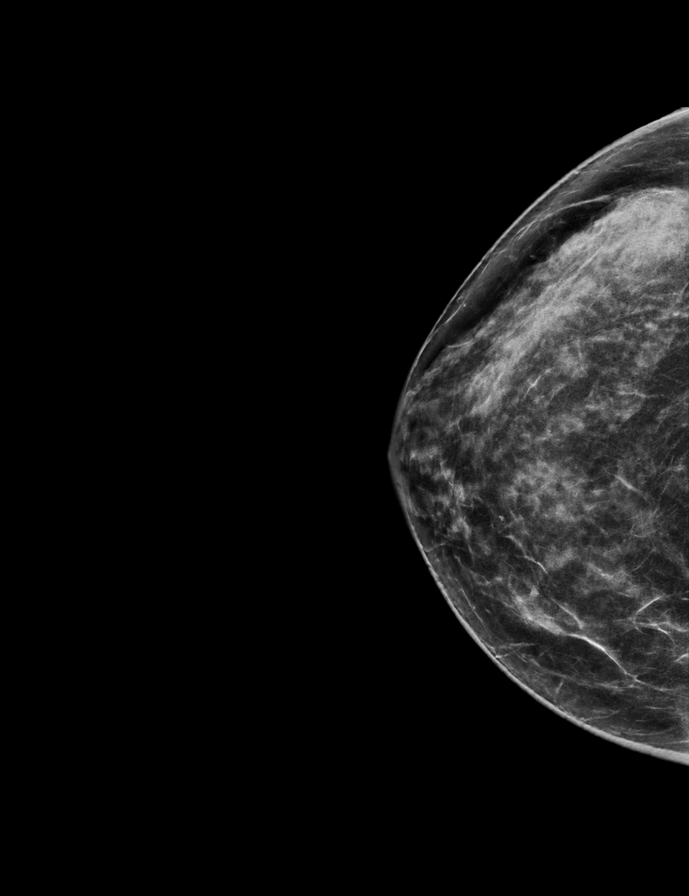

[L MLO synth-2D]
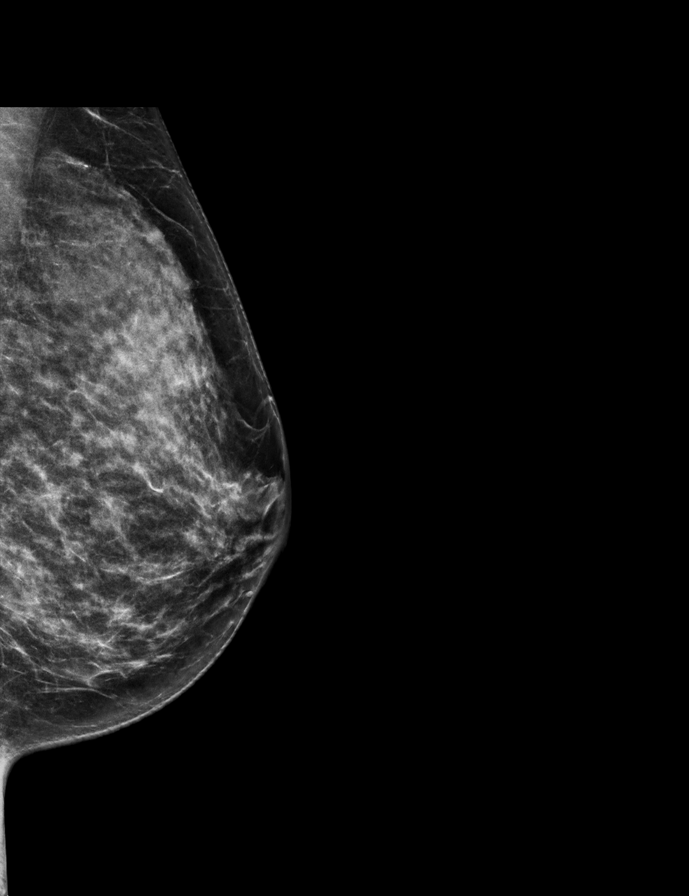

[L CC synth-2D]
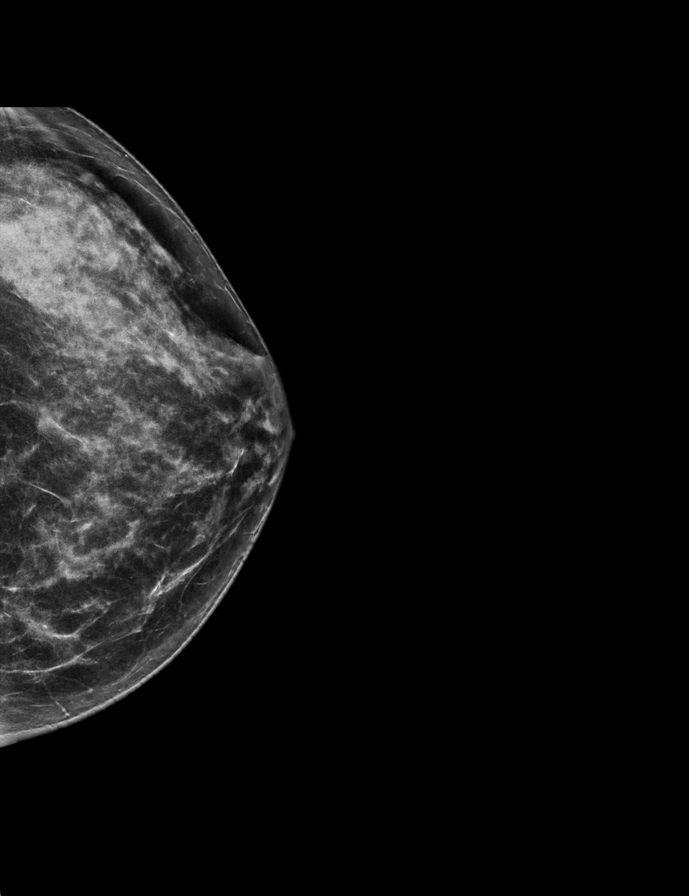

[R MLO synth-2D]
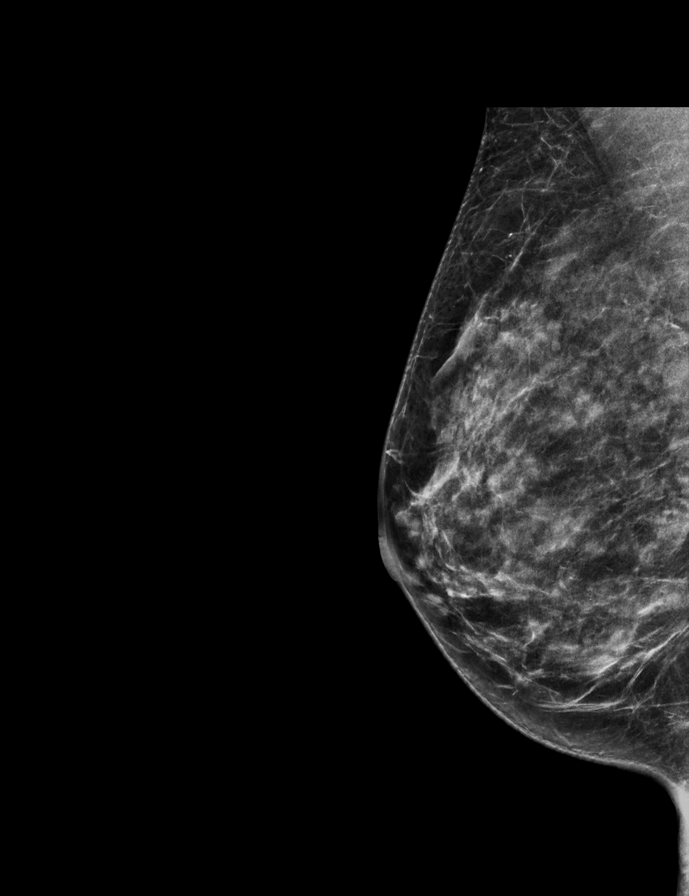

[L MLO tomo · 2 of 58 frames shown]
[frame 19/58]
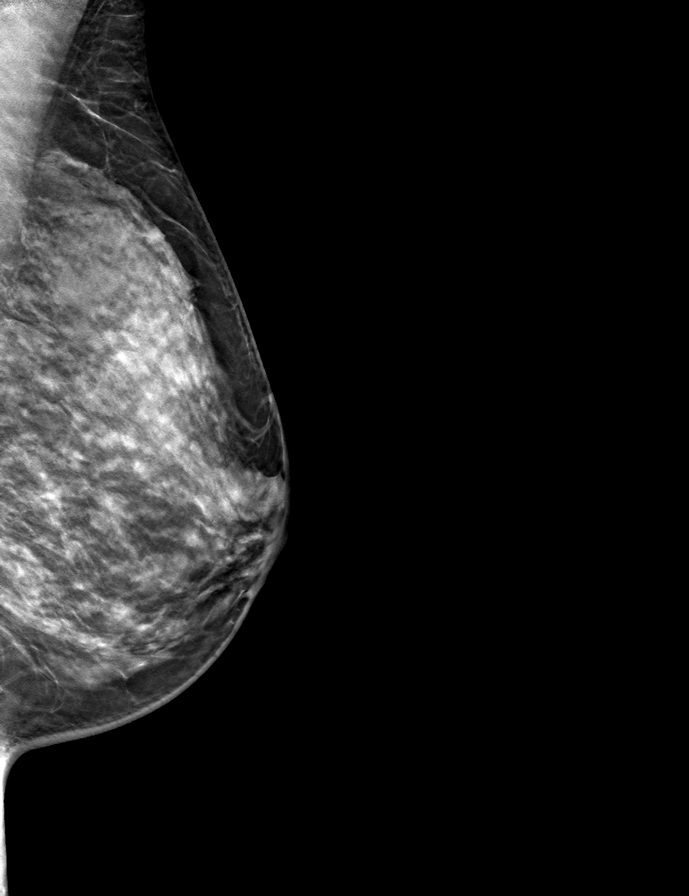
[frame 29/58]
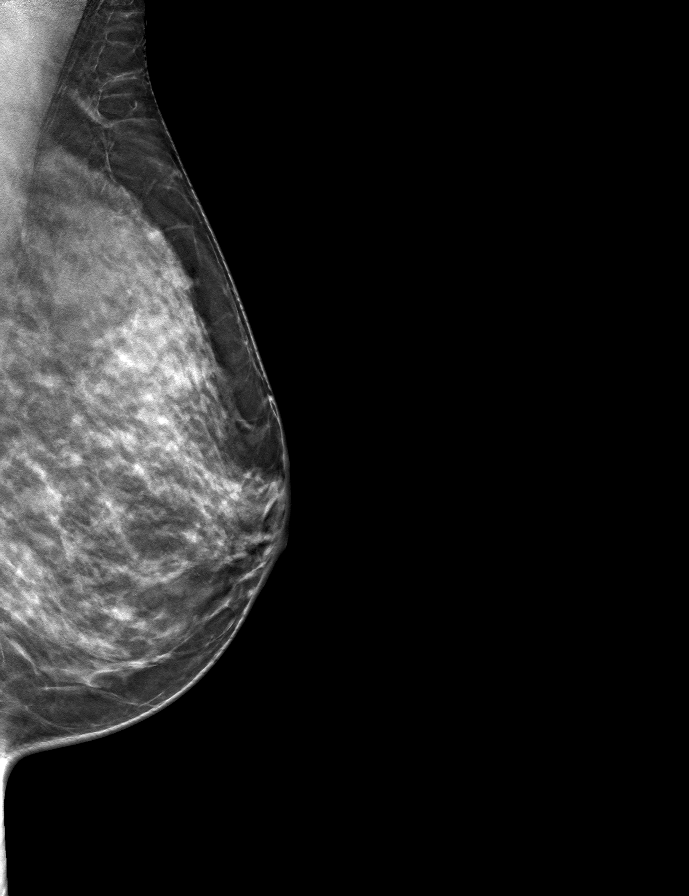

[L CC tomo · tomo slice 29/58.0]
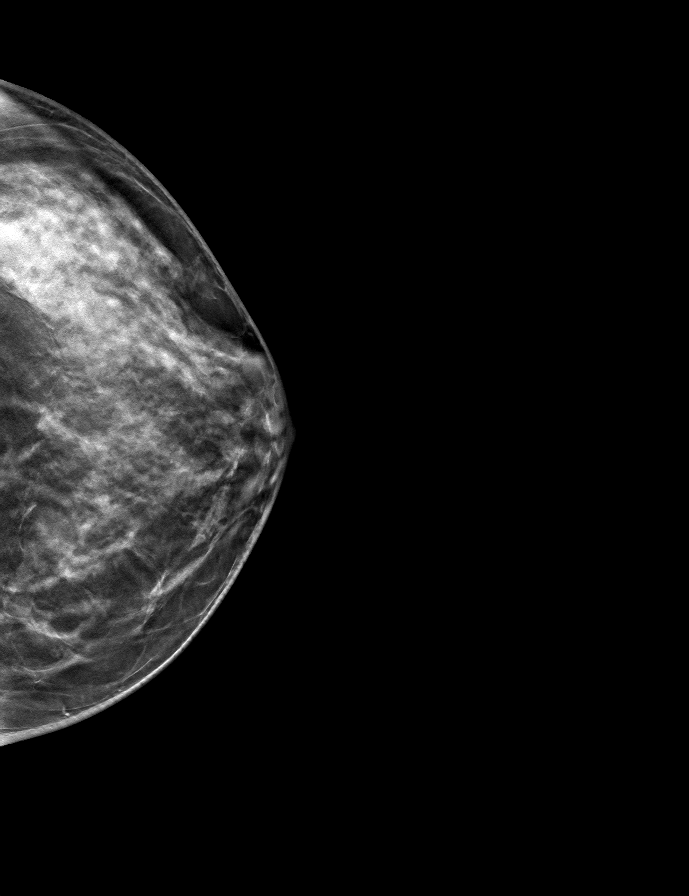

[R CC tomo · tomo slice 31/62.0]
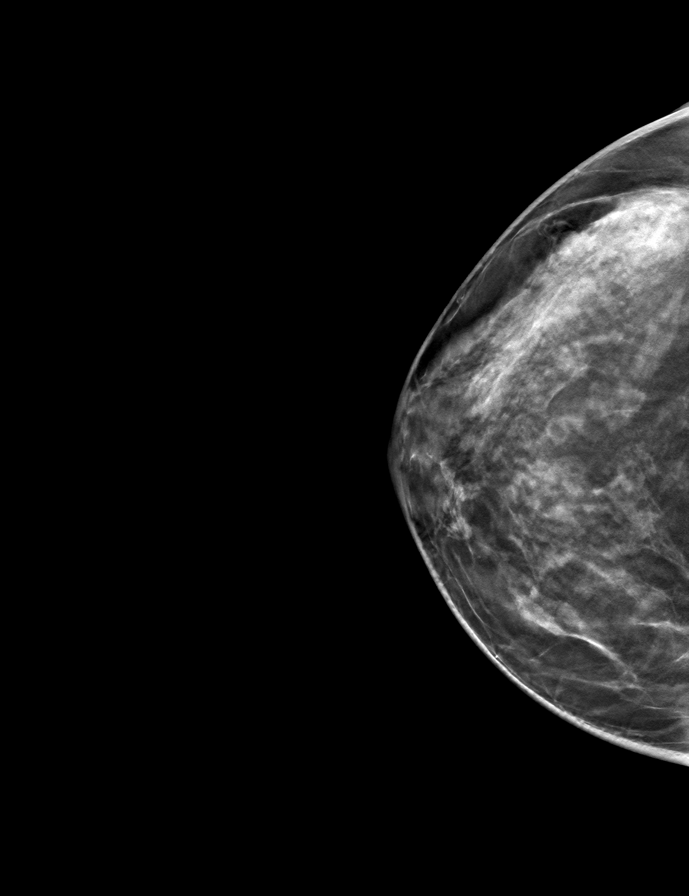

[R MLO tomo · tomo slice 29/58.0]
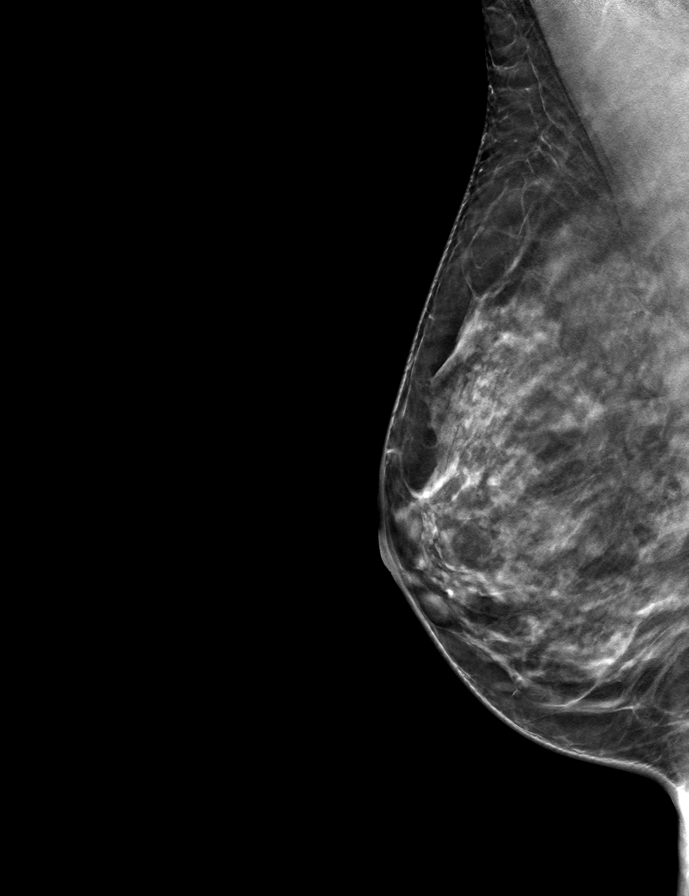

[9 of 24 positions shown; findings below may reference images not displayed]

ACR Breast Density Category d: The breast tissue is extremely dense,
which lowers the sensitivity of mammography
FINDINGS: There are no findings suspicious for malignancy. Images were
processed with CAD.
IMPRESSION: No mammographic evidence of malignancy. A result letter of this
screening mammogram will be mailed directly to the patient.

RECOMMENDATION:
Screening mammogram in one year. (Code:WO-[id number])

BI-RADS CATEGORY  1: Negative.

## 2021-05-29 LAB — TESTOSTERONE, TOTAL, LC/MS/MS: Testosterone, total: 5.4 ng/dL

## 2021-06-12 ENCOUNTER — Other Ambulatory Visit (HOSPITAL_BASED_OUTPATIENT_CLINIC_OR_DEPARTMENT_OTHER): Payer: Self-pay | Admitting: Obstetrics & Gynecology

## 2021-06-26 ENCOUNTER — Other Ambulatory Visit: Payer: Self-pay | Admitting: Internal Medicine

## 2021-06-27 ENCOUNTER — Other Ambulatory Visit: Payer: Self-pay | Admitting: Internal Medicine

## 2021-06-27 DIAGNOSIS — E042 Nontoxic multinodular goiter: Secondary | ICD-10-CM

## 2021-07-13 ENCOUNTER — Other Ambulatory Visit: Payer: Self-pay | Admitting: Hematology

## 2021-07-17 ENCOUNTER — Encounter: Payer: Self-pay | Admitting: Hematology

## 2021-07-18 ENCOUNTER — Ambulatory Visit
Admission: RE | Admit: 2021-07-18 | Discharge: 2021-07-18 | Disposition: A | Discharge status: Home / Self Care | Payer: Medicare Other | Source: Ambulatory Visit | Attending: Internal Medicine | Admitting: Internal Medicine

## 2021-07-18 ENCOUNTER — Encounter: Payer: Self-pay | Admitting: Hematology

## 2021-07-18 DIAGNOSIS — E042 Nontoxic multinodular goiter: Secondary | ICD-10-CM

## 2021-09-13 ENCOUNTER — Other Ambulatory Visit (HOSPITAL_BASED_OUTPATIENT_CLINIC_OR_DEPARTMENT_OTHER): Payer: Medicare Other

## 2021-09-15 ENCOUNTER — Ambulatory Visit
Admission: RE | Admit: 2021-09-15 | Discharge: 2021-09-15 | Disposition: A | Discharge status: Home / Self Care | Payer: Medicare Other | Source: Ambulatory Visit | Attending: Internal Medicine | Admitting: Internal Medicine

## 2021-09-15 DIAGNOSIS — Z1231 Encounter for screening mammogram for malignant neoplasm of breast: Secondary | ICD-10-CM

## 2021-09-19 ENCOUNTER — Other Ambulatory Visit (HOSPITAL_BASED_OUTPATIENT_CLINIC_OR_DEPARTMENT_OTHER): Payer: Medicare Other

## 2021-09-19 DIAGNOSIS — R6882 Decreased libido: Secondary | ICD-10-CM

## 2021-09-21 LAB — TESTOSTERONE, TOTAL, LC/MS/MS: Testosterone, total: 5.8 ng/dL

## 2021-09-27 ENCOUNTER — Encounter (HOSPITAL_BASED_OUTPATIENT_CLINIC_OR_DEPARTMENT_OTHER): Payer: Self-pay | Admitting: Obstetrics & Gynecology

## 2021-10-05 ENCOUNTER — Telehealth (HOSPITAL_BASED_OUTPATIENT_CLINIC_OR_DEPARTMENT_OTHER): Payer: Self-pay | Admitting: *Deleted

## 2021-10-05 NOTE — Telephone Encounter (Signed)
Pt called requesting a suggestion for a cream or lotion that she could use on the days that she is not using her hormonal cream to help with vulvar dryness. Pt is asking if love wellness cream is an ok product to use. Advised pt that covering provider recommends V-magic or the love wellness cream. Pt verbalized understanding.

## 2021-10-16 ENCOUNTER — Telehealth: Payer: Self-pay | Admitting: *Deleted

## 2021-10-16 NOTE — Chronic Care Management (AMB) (Signed)
  Care Coordination   Note   10/16/2021 Name: Denise Kerr MRN: [id number] DOB: May 18, 1970  Denise Kerr is a 51 y.o. year old female who sees Brigitte Pulse, Emily Filbert., MD for primary care. I reached out to [facility] by phone today to offer care coordination services.  Ms. Fukushima was given information about Care Coordination services today including:   The Care Coordination services include support from the care team which includes your Nurse Coordinator, Clinical Social Worker, or Pharmacist.  The Care Coordination team is here to help remove barriers to the health concerns and goals most important to you. Care Coordination services are voluntary, and the patient may decline or stop services at any time by request to their care team member.   Care Coordination Consent Status: Patient did not agree to participate in care coordination services at this time.   Encounter Outcome:  Pt. Refused  [place]  Direct Dial: [phone]

## 2021-10-18 ENCOUNTER — Encounter (HOSPITAL_BASED_OUTPATIENT_CLINIC_OR_DEPARTMENT_OTHER): Payer: Self-pay | Admitting: Obstetrics & Gynecology

## 2021-10-19 ENCOUNTER — Other Ambulatory Visit (HOSPITAL_BASED_OUTPATIENT_CLINIC_OR_DEPARTMENT_OTHER): Payer: Self-pay | Admitting: Obstetrics & Gynecology

## 2021-10-19 DIAGNOSIS — R509 Fever, unspecified: Secondary | ICD-10-CM

## 2021-10-20 ENCOUNTER — Other Ambulatory Visit (HOSPITAL_BASED_OUTPATIENT_CLINIC_OR_DEPARTMENT_OTHER): Payer: Self-pay | Admitting: Obstetrics & Gynecology

## 2021-10-20 MED ORDER — NONFORMULARY OR COMPOUNDED ITEM
1 refills | Status: DC
Start: 2021-10-20 — End: 2022-07-27

## 2021-11-08 ENCOUNTER — Encounter (HOSPITAL_BASED_OUTPATIENT_CLINIC_OR_DEPARTMENT_OTHER): Payer: Self-pay | Admitting: Obstetrics & Gynecology

## 2021-11-08 ENCOUNTER — Other Ambulatory Visit (HOSPITAL_BASED_OUTPATIENT_CLINIC_OR_DEPARTMENT_OTHER): Payer: Medicare Other

## 2021-11-08 ENCOUNTER — Other Ambulatory Visit: Payer: Self-pay | Admitting: Obstetrics & Gynecology

## 2021-11-08 ENCOUNTER — Encounter (HOSPITAL_BASED_OUTPATIENT_CLINIC_OR_DEPARTMENT_OTHER): Payer: Self-pay

## 2021-11-08 LAB — CBC WITH DIFFERENTIAL/PLATELET
Basophils Absolute: 0.1 10*3/uL (ref 0.0–0.2)
Basos: 1 %
EOS (ABSOLUTE): 0.2 10*3/uL (ref 0.0–0.4)
Eos: 3 %
Hematocrit: 41.2 % (ref 34.0–46.6)
Hemoglobin: 13.9 g/dL (ref 11.1–15.9)
Immature Grans (Abs): 0.1 10*3/uL (ref 0.0–0.1)
Immature Granulocytes: 1 %
Lymphocytes Absolute: 1.4 10*3/uL (ref 0.7–3.1)
Lymphs: 23 %
MCH: 31.1 pg (ref 26.6–33.0)
MCHC: 33.7 g/dL (ref 31.5–35.7)
MCV: 92 fL (ref 79–97)
Monocytes Absolute: 0.4 10*3/uL (ref 0.1–0.9)
Monocytes: 6 %
Neutrophils Absolute: 4 10*3/uL (ref 1.4–7.0)
Neutrophils: 66 %
Platelets: 199 10*3/uL (ref 150–450)
RBC: 4.47 x10E6/uL (ref 3.77–5.28)
RDW: 11.8 % (ref 11.7–15.4)
WBC: 6.1 10*3/uL (ref 3.4–10.8)

## 2021-12-28 ENCOUNTER — Other Ambulatory Visit (HOSPITAL_BASED_OUTPATIENT_CLINIC_OR_DEPARTMENT_OTHER): Payer: Self-pay | Admitting: *Deleted

## 2021-12-28 DIAGNOSIS — E559 Vitamin D deficiency, unspecified: Secondary | ICD-10-CM

## 2021-12-28 MED ORDER — VITAMIN D (ERGOCALCIFEROL) 1.25 MG (50000 UNIT) PO CAPS: 50000.0000 [IU] | ORAL_CAPSULE | ORAL | 1 refills | Status: DC

## 2022-02-03 ENCOUNTER — Encounter (HOSPITAL_BASED_OUTPATIENT_CLINIC_OR_DEPARTMENT_OTHER): Payer: Self-pay | Admitting: Obstetrics & Gynecology

## 2022-05-14 ENCOUNTER — Other Ambulatory Visit: Payer: Self-pay | Admitting: Internal Medicine

## 2022-05-14 DIAGNOSIS — E041 Nontoxic single thyroid nodule: Secondary | ICD-10-CM

## 2022-05-15 ENCOUNTER — Encounter: Payer: Self-pay | Admitting: Hematology

## 2022-05-22 ENCOUNTER — Telehealth: Payer: Medicare Other | Admitting: Nurse Practitioner

## 2022-05-22 DIAGNOSIS — J40 Bronchitis, not specified as acute or chronic: Secondary | ICD-10-CM

## 2022-05-22 MED ORDER — BENZONATATE 100 MG PO CAPS: 100.0000 mg | ORAL_CAPSULE | Freq: Three times a day (TID) | ORAL | 0 refills | Status: DC | PRN

## 2022-05-22 MED ORDER — IPRATROPIUM BROMIDE 0.03 % NA SOLN: 2.0000 | Freq: Two times a day (BID) | NASAL | 12 refills | Status: DC

## 2022-05-22 MED ORDER — DOXYCYCLINE HYCLATE 100 MG PO TABS: 100.0000 mg | ORAL_TABLET | Freq: Two times a day (BID) | ORAL | 0 refills | Status: AC

## 2022-05-22 NOTE — Progress Notes (Signed)
E-Visit for Cough  We are sorry that you are not feeling well.  Here is how we plan to help!  Based on your presentation I believe you most likely have A cough due to bacteria.  When patients have a fever and a productive cough with a change in color or increased sputum production, we are concerned about bacterial bronchitis.  If left untreated it can progress to pneumonia.  If your symptoms do not improve with your treatment plan it is important that you contact your provider.   I have prescribed Doxycycline 100 mg twice a day for 10 days     In addition you may use A prescription cough medication called Tessalon Perles 100mg . You may take 1-2 capsules every 8 hours as needed for your cough.    From your responses in the eVisit questionnaire you describe inflammation in the upper respiratory tract which is causing a significant cough.  This is commonly called Bronchitis and has four common causes:   Allergies Viral Infections Acid Reflux Bacterial Infection Allergies, viruses and acid reflux are treated by controlling symptoms or eliminating the cause. An example might be a cough caused by taking certain blood pressure medications. You stop the cough by changing the medication. Another example might be a cough caused by acid reflux. Controlling the reflux helps control the cough.  USE OF BRONCHODILATOR ("RESCUE") INHALERS: There is a risk from using your bronchodilator too frequently.  The risk is that over-reliance on a medication which only relaxes the muscles surrounding the breathing tubes can reduce the effectiveness of medications prescribed to reduce swelling and congestion of the tubes themselves.  Although you feel brief relief from the bronchodilator inhaler, your asthma may actually be worsening with the tubes becoming more swollen and filled with mucus.  This can delay other crucial treatments, such as oral steroid medications. If you need to use a bronchodilator inhaler daily, several  times per day, you should discuss this with your provider.  There are probably better treatments that could be used to keep your asthma under control.     HOME CARE Only take medications as instructed by your medical team. Complete the entire course of an antibiotic. Drink plenty of fluids and get plenty of rest. Avoid close contacts especially the very young and the elderly Cover your mouth if you cough or cough into your sleeve. Always remember to wash your hands A steam or ultrasonic humidifier can help congestion.   GET HELP RIGHT AWAY IF: You develop worsening fever. You become short of breath You cough up blood. Your symptoms persist after you have completed your treatment plan MAKE SURE YOU  Understand these instructions. Will watch your condition. Will get help right away if you are not doing well or get worse.    Thank you for choosing an e-visit.  Your e-visit answers were reviewed by a board certified advanced clinical practitioner to complete your personal care plan. Depending upon the condition, your plan could have included both over the counter or prescription medications.  Please review your pharmacy choice. Make sure the pharmacy is open so you can pick up prescription now. If there is a problem, you may contact your provider through [employer] and have the prescription routed to another pharmacy.  Your safety is important to [place]. If you have drug allergies check your prescription carefully.   For the next 24 hours you can use MyChart to ask questions about today's visit, request a non-urgent call back, or ask  for a work or school excuse. You will get an email in the next two days asking about your experience. I hope that your e-visit has been valuable and will speed your recovery.   Meds ordered this encounter  Medications   ipratropium (ATROVENT) 0.03 % nasal spray    Sig: Place 2 sprays into both nostrils every 12 (twelve) hours.    Dispense:  30 mL     Refill:  12   doxycycline (VIBRA-TABS) 100 MG tablet    Sig: Take 1 tablet (100 mg total) by mouth 2 (two) times daily for 10 days.    Dispense:  20 tablet    Refill:  0   benzonatate (TESSALON) 100 MG capsule    Sig: Take 1 capsule (100 mg total) by mouth 3 (three) times daily as needed.    Dispense:  30 capsule    Refill:  0    I spent approximately 5 minutes reviewing the patient's history, current symptoms and coordinating their care today.

## 2022-05-24 ENCOUNTER — Encounter (HOSPITAL_BASED_OUTPATIENT_CLINIC_OR_DEPARTMENT_OTHER): Payer: Self-pay

## 2022-05-24 ENCOUNTER — Ambulatory Visit (HOSPITAL_BASED_OUTPATIENT_CLINIC_OR_DEPARTMENT_OTHER): Payer: Medicare Other | Admitting: Obstetrics & Gynecology

## 2022-06-05 ENCOUNTER — Encounter: Payer: Self-pay | Admitting: Hematology

## 2022-06-06 ENCOUNTER — Encounter (HOSPITAL_BASED_OUTPATIENT_CLINIC_OR_DEPARTMENT_OTHER): Payer: Self-pay | Admitting: Obstetrics & Gynecology

## 2022-06-07 ENCOUNTER — Encounter (HOSPITAL_BASED_OUTPATIENT_CLINIC_OR_DEPARTMENT_OTHER): Payer: Self-pay | Admitting: Obstetrics & Gynecology

## 2022-06-08 ENCOUNTER — Other Ambulatory Visit (HOSPITAL_BASED_OUTPATIENT_CLINIC_OR_DEPARTMENT_OTHER): Payer: Medicare Other

## 2022-06-08 ENCOUNTER — Other Ambulatory Visit: Payer: Medicare Other

## 2022-06-08 ENCOUNTER — Encounter (HOSPITAL_BASED_OUTPATIENT_CLINIC_OR_DEPARTMENT_OTHER): Payer: Self-pay

## 2022-06-13 ENCOUNTER — Other Ambulatory Visit (HOSPITAL_BASED_OUTPATIENT_CLINIC_OR_DEPARTMENT_OTHER): Payer: Self-pay | Admitting: Obstetrics & Gynecology

## 2022-06-13 DIAGNOSIS — E559 Vitamin D deficiency, unspecified: Secondary | ICD-10-CM

## 2022-06-14 ENCOUNTER — Other Ambulatory Visit (HOSPITAL_BASED_OUTPATIENT_CLINIC_OR_DEPARTMENT_OTHER): Payer: Medicare Other

## 2022-06-14 ENCOUNTER — Encounter (HOSPITAL_BASED_OUTPATIENT_CLINIC_OR_DEPARTMENT_OTHER): Payer: Self-pay

## 2022-06-14 ENCOUNTER — Other Ambulatory Visit (HOSPITAL_BASED_OUTPATIENT_CLINIC_OR_DEPARTMENT_OTHER): Payer: Self-pay | Admitting: Obstetrics & Gynecology

## 2022-06-14 DIAGNOSIS — R6882 Decreased libido: Secondary | ICD-10-CM

## 2022-06-19 ENCOUNTER — Encounter (HOSPITAL_BASED_OUTPATIENT_CLINIC_OR_DEPARTMENT_OTHER): Payer: Self-pay | Admitting: Obstetrics & Gynecology

## 2022-06-19 LAB — ESTRADIOL: Estradiol: 60.5 pg/mL

## 2022-06-19 LAB — TESTOSTERONE, TOTAL, LC/MS/MS: Testosterone, total: 290 ng/dL

## 2022-06-22 ENCOUNTER — Ambulatory Visit
Admission: RE | Admit: 2022-06-22 | Discharge: 2022-06-22 | Disposition: A | Discharge status: Home / Self Care | Payer: Medicare Other | Source: Ambulatory Visit | Attending: Internal Medicine | Admitting: Internal Medicine

## 2022-06-22 ENCOUNTER — Other Ambulatory Visit (HOSPITAL_BASED_OUTPATIENT_CLINIC_OR_DEPARTMENT_OTHER): Payer: Self-pay | Admitting: Obstetrics & Gynecology

## 2022-06-22 DIAGNOSIS — E041 Nontoxic single thyroid nodule: Secondary | ICD-10-CM

## 2022-07-09 ENCOUNTER — Encounter (HOSPITAL_BASED_OUTPATIENT_CLINIC_OR_DEPARTMENT_OTHER): Payer: Self-pay | Admitting: Obstetrics & Gynecology

## 2022-07-16 ENCOUNTER — Other Ambulatory Visit: Payer: Self-pay | Admitting: Obstetrics & Gynecology

## 2022-07-16 ENCOUNTER — Other Ambulatory Visit (HOSPITAL_BASED_OUTPATIENT_CLINIC_OR_DEPARTMENT_OTHER): Payer: Self-pay | Admitting: Obstetrics & Gynecology

## 2022-07-16 DIAGNOSIS — R6882 Decreased libido: Secondary | ICD-10-CM

## 2022-07-16 NOTE — Progress Notes (Signed)
tot 

## 2022-07-20 LAB — TESTOSTERONE, TOTAL, LC/MS/MS: Testosterone, total: 7.9 ng/dL

## 2022-07-24 ENCOUNTER — Encounter (HOSPITAL_BASED_OUTPATIENT_CLINIC_OR_DEPARTMENT_OTHER): Payer: Self-pay | Admitting: Obstetrics & Gynecology

## 2022-07-27 ENCOUNTER — Other Ambulatory Visit (HOSPITAL_BASED_OUTPATIENT_CLINIC_OR_DEPARTMENT_OTHER): Payer: Self-pay | Admitting: Obstetrics & Gynecology

## 2022-07-27 MED ORDER — NONFORMULARY OR COMPOUNDED ITEM: 0 refills | Status: DC

## 2022-07-31 ENCOUNTER — Other Ambulatory Visit (HOSPITAL_BASED_OUTPATIENT_CLINIC_OR_DEPARTMENT_OTHER): Payer: Self-pay | Admitting: Obstetrics & Gynecology

## 2022-07-31 DIAGNOSIS — R6882 Decreased libido: Secondary | ICD-10-CM

## 2022-07-31 NOTE — Telephone Encounter (Signed)
LMOVM for pt to call office to set up follow up lab appt

## 2022-08-07 ENCOUNTER — Other Ambulatory Visit (HOSPITAL_COMMUNITY)
Admission: RE | Admit: 2022-08-07 | Discharge: 2022-08-07 | Disposition: A | Discharge status: Home / Self Care | Payer: Medicare Other | Source: Ambulatory Visit | Attending: Obstetrics & Gynecology | Admitting: Obstetrics & Gynecology

## 2022-08-07 ENCOUNTER — Ambulatory Visit (INDEPENDENT_AMBULATORY_CARE_PROVIDER_SITE_OTHER): Payer: Medicare Other | Admitting: Obstetrics & Gynecology

## 2022-08-07 ENCOUNTER — Encounter (HOSPITAL_BASED_OUTPATIENT_CLINIC_OR_DEPARTMENT_OTHER): Payer: Self-pay | Admitting: Obstetrics & Gynecology

## 2022-08-07 VITALS — BP 127/70 | HR 115 | Ht 63.75 in | Wt 122.0 lb

## 2022-08-07 DIAGNOSIS — Z1151 Encounter for screening for human papillomavirus (HPV): Secondary | ICD-10-CM | POA: Insufficient documentation

## 2022-08-07 DIAGNOSIS — R6882 Decreased libido: Secondary | ICD-10-CM

## 2022-08-07 DIAGNOSIS — Z01419 Encounter for gynecological examination (general) (routine) without abnormal findings: Secondary | ICD-10-CM

## 2022-08-07 DIAGNOSIS — E119 Type 2 diabetes mellitus without complications: Secondary | ICD-10-CM

## 2022-08-07 DIAGNOSIS — Z124 Encounter for screening for malignant neoplasm of cervix: Secondary | ICD-10-CM

## 2022-08-07 DIAGNOSIS — D490 Neoplasm of unspecified behavior of digestive system: Secondary | ICD-10-CM
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
## 2022-08-08 ENCOUNTER — Encounter (HOSPITAL_BASED_OUTPATIENT_CLINIC_OR_DEPARTMENT_OTHER): Payer: Self-pay | Admitting: Obstetrics & Gynecology

## 2022-08-09 LAB — CYTOLOGY - PAP
Adequacy: ABSENT
Comment: NEGATIVE
Diagnosis: NEGATIVE
High risk HPV: NEGATIVE

## 2022-09-07 ENCOUNTER — Other Ambulatory Visit (HOSPITAL_BASED_OUTPATIENT_CLINIC_OR_DEPARTMENT_OTHER): Payer: Medicare Other

## 2022-09-07 ENCOUNTER — Encounter (HOSPITAL_BASED_OUTPATIENT_CLINIC_OR_DEPARTMENT_OTHER): Payer: Self-pay

## 2022-09-15 ENCOUNTER — Other Ambulatory Visit (HOSPITAL_BASED_OUTPATIENT_CLINIC_OR_DEPARTMENT_OTHER): Payer: Self-pay | Admitting: Obstetrics & Gynecology

## 2022-09-15 DIAGNOSIS — E559 Vitamin D deficiency, unspecified: Secondary | ICD-10-CM

## 2022-10-03 ENCOUNTER — Other Ambulatory Visit (HOSPITAL_BASED_OUTPATIENT_CLINIC_OR_DEPARTMENT_OTHER): Payer: Medicare Other

## 2022-10-03 DIAGNOSIS — R6882 Decreased libido: Secondary | ICD-10-CM

## 2022-10-18 ENCOUNTER — Encounter (HOSPITAL_BASED_OUTPATIENT_CLINIC_OR_DEPARTMENT_OTHER): Payer: Self-pay | Admitting: *Deleted

## 2022-10-18 ENCOUNTER — Telehealth (HOSPITAL_BASED_OUTPATIENT_CLINIC_OR_DEPARTMENT_OTHER): Payer: Self-pay | Admitting: *Deleted

## 2022-10-18 NOTE — Telephone Encounter (Signed)
TC from patient.  She wants to follow up on the result of testosterone that was done on 10/03/22.   Testosterone results 10/03/22 8.9, May 2024 7.9 April 290 and July 2023 5.8.

## 2022-10-24 ENCOUNTER — Encounter (HOSPITAL_BASED_OUTPATIENT_CLINIC_OR_DEPARTMENT_OTHER): Payer: Self-pay | Admitting: Obstetrics & Gynecology

## 2022-10-30 ENCOUNTER — Other Ambulatory Visit: Payer: Self-pay | Admitting: Internal Medicine

## 2022-10-30 DIAGNOSIS — Z1231 Encounter for screening mammogram for malignant neoplasm of breast: Secondary | ICD-10-CM

## 2022-11-09 ENCOUNTER — Other Ambulatory Visit (HOSPITAL_BASED_OUTPATIENT_CLINIC_OR_DEPARTMENT_OTHER): Payer: Self-pay | Admitting: Obstetrics & Gynecology

## 2022-11-09 ENCOUNTER — Encounter (HOSPITAL_BASED_OUTPATIENT_CLINIC_OR_DEPARTMENT_OTHER): Payer: Self-pay | Admitting: Obstetrics & Gynecology

## 2022-11-09 DIAGNOSIS — Z780 Asymptomatic menopausal state: Secondary | ICD-10-CM

## 2022-11-09 MED ORDER — NONFORMULARY OR COMPOUNDED ITEM
Status: DC
Start: 2022-11-09 — End: 2023-09-12

## 2022-11-09 MED ORDER — NONFORMULARY OR COMPOUNDED ITEM: Status: DC

## 2022-11-21 ENCOUNTER — Ambulatory Visit
Admission: RE | Admit: 2022-11-21 | Discharge: 2022-11-21 | Disposition: A | Discharge status: Home / Self Care | Payer: Medicare Other | Source: Ambulatory Visit | Attending: Internal Medicine | Admitting: Internal Medicine

## 2022-11-21 DIAGNOSIS — Z1231 Encounter for screening mammogram for malignant neoplasm of breast: Secondary | ICD-10-CM

## 2022-11-26 NOTE — Telephone Encounter (Signed)
See 10/24/22 message for answer to this. KW CMA

## 2022-12-08 ENCOUNTER — Encounter (HOSPITAL_BASED_OUTPATIENT_CLINIC_OR_DEPARTMENT_OTHER): Payer: Self-pay | Admitting: Obstetrics & Gynecology

## 2022-12-08 ENCOUNTER — Other Ambulatory Visit (HOSPITAL_BASED_OUTPATIENT_CLINIC_OR_DEPARTMENT_OTHER): Payer: Self-pay | Admitting: Obstetrics & Gynecology

## 2022-12-08 DIAGNOSIS — E559 Vitamin D deficiency, unspecified: Secondary | ICD-10-CM

## 2023-02-11 ENCOUNTER — Other Ambulatory Visit (HOSPITAL_BASED_OUTPATIENT_CLINIC_OR_DEPARTMENT_OTHER): Payer: Medicare Other

## 2023-02-11 ENCOUNTER — Encounter (HOSPITAL_BASED_OUTPATIENT_CLINIC_OR_DEPARTMENT_OTHER): Payer: Self-pay | Admitting: Obstetrics & Gynecology

## 2023-02-11 DIAGNOSIS — R6882 Decreased libido: Secondary | ICD-10-CM

## 2023-02-14 ENCOUNTER — Encounter (HOSPITAL_BASED_OUTPATIENT_CLINIC_OR_DEPARTMENT_OTHER): Payer: Self-pay | Admitting: Obstetrics & Gynecology

## 2023-02-14 LAB — TESTOSTERONE, TOTAL, LC/MS/MS: Testosterone, total: 10.9 ng/dL

## 2023-02-28 ENCOUNTER — Other Ambulatory Visit (HOSPITAL_BASED_OUTPATIENT_CLINIC_OR_DEPARTMENT_OTHER): Payer: Self-pay | Admitting: Obstetrics & Gynecology

## 2023-02-28 DIAGNOSIS — E559 Vitamin D deficiency, unspecified: Secondary | ICD-10-CM

## 2023-03-01 ENCOUNTER — Other Ambulatory Visit (HOSPITAL_BASED_OUTPATIENT_CLINIC_OR_DEPARTMENT_OTHER): Payer: Self-pay | Admitting: Obstetrics & Gynecology

## 2023-03-01 DIAGNOSIS — Z780 Asymptomatic menopausal state: Secondary | ICD-10-CM

## 2023-03-01 MED ORDER — NONFORMULARY OR COMPOUNDED ITEM: Status: AC

## 2023-03-01 MED ORDER — NONFORMULARY OR COMPOUNDED ITEM
Status: DC
Start: 2023-03-01 — End: 2023-03-01

## 2023-05-23 ENCOUNTER — Other Ambulatory Visit (HOSPITAL_BASED_OUTPATIENT_CLINIC_OR_DEPARTMENT_OTHER): Payer: Self-pay | Admitting: *Deleted

## 2023-05-23 DIAGNOSIS — Z780 Asymptomatic menopausal state: Secondary | ICD-10-CM

## 2023-05-26 LAB — TESTOSTERONE, TOTAL, LC/MS/MS: Testosterone, total: 137.7 ng/dL

## 2023-05-29 ENCOUNTER — Telehealth (HOSPITAL_BASED_OUTPATIENT_CLINIC_OR_DEPARTMENT_OTHER): Payer: Self-pay

## 2023-05-29 NOTE — Telephone Encounter (Signed)
 Pt called in regards to her testosterone lab. She stated she had some concerns with the level.

## 2023-06-07 ENCOUNTER — Other Ambulatory Visit (HOSPITAL_BASED_OUTPATIENT_CLINIC_OR_DEPARTMENT_OTHER): Payer: Self-pay | Admitting: Obstetrics & Gynecology

## 2023-06-07 DIAGNOSIS — R6882 Decreased libido: Secondary | ICD-10-CM

## 2023-06-28 ENCOUNTER — Other Ambulatory Visit (HOSPITAL_BASED_OUTPATIENT_CLINIC_OR_DEPARTMENT_OTHER): Payer: Self-pay | Admitting: Obstetrics & Gynecology

## 2023-08-12 ENCOUNTER — Ambulatory Visit (HOSPITAL_BASED_OUTPATIENT_CLINIC_OR_DEPARTMENT_OTHER): Payer: Medicare Other | Admitting: Obstetrics & Gynecology

## 2023-09-12 ENCOUNTER — Ambulatory Visit (HOSPITAL_BASED_OUTPATIENT_CLINIC_OR_DEPARTMENT_OTHER): Admitting: Obstetrics & Gynecology

## 2023-09-12 VITALS — BP 122/81 | HR 105 | Wt 123.0 lb

## 2023-09-12 DIAGNOSIS — Z9229 Personal history of other drug therapy: Secondary | ICD-10-CM

## 2023-09-12 DIAGNOSIS — D500 Iron deficiency anemia secondary to blood loss (chronic): Secondary | ICD-10-CM

## 2023-09-12 DIAGNOSIS — E1065 Type 1 diabetes mellitus with hyperglycemia: Secondary | ICD-10-CM

## 2023-09-12 DIAGNOSIS — Z780 Asymptomatic menopausal state: Secondary | ICD-10-CM | POA: Diagnosis not present

## 2023-09-12 DIAGNOSIS — Z01419 Encounter for gynecological examination (general) (routine) without abnormal findings: Secondary | ICD-10-CM | POA: Diagnosis not present

## 2023-09-12 DIAGNOSIS — N80559 Endometriosis of other parts of the colon, unspecified depth: Secondary | ICD-10-CM

## 2023-09-12 MED ORDER — ESTRADIOL 0.1 MG/GM VA CREA: TOPICAL_CREAM | VAGINAL | 1 refills | Status: AC

## 2023-09-12 MED ORDER — NONFORMULARY OR COMPOUNDED ITEM
3 refills | Status: AC
Start: 2023-09-12 — End: ?

## 2023-09-12 NOTE — Progress Notes (Unsigned)
 Breast and Pelvic Exam Patient name: Denise Kerr MRN [id number]  Date of birth: January 30, 1971 Chief Complaint:   Breast and Pelvic, using vaginal estrogen  History of Present Illness:   Denise Kerr is a 53 y.o. G24P0010 Caucasian female being seen today for a routine annual exam.  Denies vaginal bleeding.  Doing well except blood sugar control has not been at good.  Followed at [facility] by endocrinology.  Her provider left and now the provider she is seeing isn't as good of a fit for her.  Some changes have affected blood sugars and HbA1C this last six months have been 9.0 and 8.1.  Options reviewed.  From hormonal standpoint, says she feels really good.  Is using vaginal estradiol  suppository with some external estradiol  cream twice weekly.  No vaginal bleeding.  Does still have Kyleena  IUD.  This is year 5 but she does not want removed today.  Just feeling really good.  Doesn't want to make any changes.  Reviewed FDA 5 year indication but there is likely some progesterone still present from this so feel it is ok to monitor for another year.  Hair is thicker and she is really happy with this.  Aging father is causes some stressors.   Patient's last menstrual period was 10/23/2018 (approximate).   Last pap 07/2022. Results were: NILM w/ HRHPV negative. H/O abnormal pap: no Last mammogram: 10/2022. Results were: normal. Family h/o breast cancer: no Last colonoscopy: 2020. Follow up 5 years.       08/07/2022    1:44 PM 05/25/2021    2:46 PM 02/08/2020   10:53 AM 10/15/2015   11:41 AM  Depression screen PHQ 2/9  Decreased Interest 0 0 2 1  Down, Depressed, Hopeless 0 0 2 0  PHQ - 2 Score 0 0 4 1  Altered sleeping   3   Tired, decreased energy   3   Change in appetite   2   Feeling bad or failure about yourself    1   Trouble concentrating   1   Moving slowly or fidgety/restless   0   Suicidal thoughts   0   PHQ-9 Score   14   Difficult doing work/chores   Very difficult      Review of Systems:   Pertinent items are noted in HPI Denies any new urinary or bowel changes.  No pelvic pain Pertinent History Reviewed:  Reviewed past medical,surgical, social and family history.  Reviewed problem list, medications and allergies. Physical Assessment:   Vitals:   09/12/23 1530  BP: 122/81  Pulse: (!) 105  SpO2: 98%  Weight: 123 lb (55.8 kg)  Body mass index is 21.28 kg/m.        Physical Examination:   General appearance - well appearing, and in no distress  Mental status - alert, oriented to person, place, and time  Psych:  She has a normal mood and affect  Skin - warm and dry, normal color, no suspicious lesions noted  Chest - effort normal, all lung fields clear to auscultation bilaterally  Heart - normal rate and regular rhythm  Neck:  midline trachea, no thyromegaly or nodules  Breasts - breasts appear normal, no suspicious masses, no skin or nipple changes or  axillary nodes  Abdomen - soft, nontender, nondistended, no masses or organomegaly  Pelvic - VULVA: normal appearing vulva with no masses, tenderness or lesions   VAGINA: normal appearing vagina with normal color and discharge, no lesions  CERVIX: normal appearing cervix without discharge or lesions, no CMT  Thin prep pap is not indicated  UTERUS: uterus is felt to be normal size, shape, consistency and nontender   ADNEXA: No adnexal masses or tenderness noted.  Rectal - normal rectal, good sphincter tone, no masses felt.  Extremities:  No swelling or varicosities noted  Chaperone present for exam  No results found for this or any previous visit (from the past 24 hours).  Assessment & Plan:  1. Encntr for gyn exam (general) (routine) w/o abn findings (Primary) - Pap smear 07/2022 - Mammogram 10/2022 - Colonoscopy 2020 follow-up 5 years - Bone mineral density not indicated yet.  But will plan to do this earlier than 63. - lab work done with PCP, Dr. Loreli - vaccines  reviewed/updated  2. Postmenopausal - estradiol  (ESTRACE ) 0.1 MG/GM vaginal cream; Apply topically externally twice weekly  Dispense: 42.5 g; Refill: 1 - NONFORMULARY OR COMPOUNDED ITEM; estradiol  cream 0.02% in vaginal suppository twice weekly.  Disp: 3 month supply  Dispense: 24 each; Refill: 3  3. Type 1 diabetes mellitus with hyperglycemia (HCC) - Ambulatory referral to Endocrinology  4. History of postmenopausal HRT - Testosterone , Total, LC/MS/MS  5.  History of low ferritin  6. Endometriosis of colon -History of laparoscopic partial colectomy in 2018    Meds: No orders of the defined types were placed in this encounter.   Follow-up: No follow-ups on file.  Ronal GORMAN [place], MD 09/12/2023 3:46 PM

## 2023-09-13 ENCOUNTER — Encounter (HOSPITAL_BASED_OUTPATIENT_CLINIC_OR_DEPARTMENT_OTHER): Payer: Self-pay | Admitting: Obstetrics & Gynecology

## 2023-09-14 LAB — TESTOSTERONE, TOTAL, LC/MS/MS: Testosterone, total: 41.7 ng/dL

## 2023-09-16 ENCOUNTER — Ambulatory Visit (HOSPITAL_BASED_OUTPATIENT_CLINIC_OR_DEPARTMENT_OTHER): Payer: Self-pay | Admitting: Obstetrics & Gynecology

## 2023-11-08 ENCOUNTER — Other Ambulatory Visit (HOSPITAL_BASED_OUTPATIENT_CLINIC_OR_DEPARTMENT_OTHER): Payer: Self-pay

## 2023-11-08 DIAGNOSIS — Z780 Asymptomatic menopausal state: Secondary | ICD-10-CM

## 2023-11-12 ENCOUNTER — Encounter (HOSPITAL_BASED_OUTPATIENT_CLINIC_OR_DEPARTMENT_OTHER): Payer: Self-pay | Admitting: Obstetrics & Gynecology

## 2023-11-13 NOTE — Progress Notes (Signed)
 [facility] called personally and verbally updated her testosterone  RF for 6 months.

## 2023-11-14 ENCOUNTER — Other Ambulatory Visit (HOSPITAL_BASED_OUTPATIENT_CLINIC_OR_DEPARTMENT_OTHER): Payer: Self-pay

## 2023-11-14 DIAGNOSIS — Z780 Asymptomatic menopausal state: Secondary | ICD-10-CM

## 2023-11-14 DIAGNOSIS — R6882 Decreased libido: Secondary | ICD-10-CM

## 2023-11-14 MED ORDER — NONFORMULARY OR COMPOUNDED ITEM: 0 refills | Status: AC

## 2023-12-25 ENCOUNTER — Other Ambulatory Visit: Payer: Self-pay | Admitting: Internal Medicine

## 2023-12-25 DIAGNOSIS — Z1231 Encounter for screening mammogram for malignant neoplasm of breast: Secondary | ICD-10-CM

## 2024-01-07 ENCOUNTER — Ambulatory Visit
Admission: RE | Admit: 2024-01-07 | Discharge: 2024-01-07 | Disposition: A | Discharge status: Home / Self Care | Source: Ambulatory Visit | Attending: Internal Medicine | Admitting: Internal Medicine

## 2024-01-07 DIAGNOSIS — Z1231 Encounter for screening mammogram for malignant neoplasm of breast: Secondary | ICD-10-CM

## 2024-02-14 ENCOUNTER — Other Ambulatory Visit (HOSPITAL_BASED_OUTPATIENT_CLINIC_OR_DEPARTMENT_OTHER): Payer: Self-pay | Admitting: Obstetrics & Gynecology

## 2024-02-14 DIAGNOSIS — E559 Vitamin D deficiency, unspecified: Secondary | ICD-10-CM
